# Patient Record
Sex: Female | Born: 1955 | Race: Black or African American | Hispanic: No | Marital: Married | Smoking: Former smoker
Health system: Southern US, Community
[De-identification: ages and names within clinical notes are randomized; demographics above are authoritative.]

## PROBLEM LIST (undated history)

## (undated) DIAGNOSIS — G9340 Encephalopathy, unspecified: Secondary | ICD-10-CM

## (undated) DIAGNOSIS — A419 Sepsis, unspecified organism: Secondary | ICD-10-CM

## (undated) DIAGNOSIS — J9600 Acute respiratory failure, unspecified whether with hypoxia or hypercapnia: Secondary | ICD-10-CM

## (undated) DIAGNOSIS — I10 Essential (primary) hypertension: Secondary | ICD-10-CM

## (undated) DIAGNOSIS — Z9889 Other specified postprocedural states: Secondary | ICD-10-CM

## (undated) DIAGNOSIS — R1310 Dysphagia, unspecified: Secondary | ICD-10-CM

## (undated) DIAGNOSIS — I639 Cerebral infarction, unspecified: Secondary | ICD-10-CM

---

## 2017-11-07 ENCOUNTER — Emergency Department (HOSPITAL_COMMUNITY): Payer: Medicaid Other

## 2017-11-07 ENCOUNTER — Inpatient Hospital Stay (HOSPITAL_COMMUNITY)
Admission: EM | Admit: 2017-11-07 | Discharge: 2017-11-30 | DRG: 004 | Disposition: A | Discharge status: Skilled Nursing Facility | Payer: Medicaid Other | Attending: Family Medicine | Admitting: Family Medicine

## 2017-11-07 ENCOUNTER — Encounter (HOSPITAL_COMMUNITY): Payer: Self-pay | Admitting: Nurse Practitioner

## 2017-11-07 DIAGNOSIS — F419 Anxiety disorder, unspecified: Secondary | ICD-10-CM | POA: Diagnosis present

## 2017-11-07 DIAGNOSIS — I110 Hypertensive heart disease with heart failure: Secondary | ICD-10-CM | POA: Diagnosis present

## 2017-11-07 DIAGNOSIS — R000 Tachycardia, unspecified: Secondary | ICD-10-CM | POA: Diagnosis present

## 2017-11-07 DIAGNOSIS — Z0189 Encounter for other specified special examinations: Secondary | ICD-10-CM

## 2017-11-07 DIAGNOSIS — E1165 Type 2 diabetes mellitus with hyperglycemia: Secondary | ICD-10-CM | POA: Diagnosis present

## 2017-11-07 DIAGNOSIS — E872 Acidosis: Secondary | ICD-10-CM | POA: Diagnosis present

## 2017-11-07 DIAGNOSIS — R0902 Hypoxemia: Secondary | ICD-10-CM | POA: Diagnosis not present

## 2017-11-07 DIAGNOSIS — Z978 Presence of other specified devices: Secondary | ICD-10-CM

## 2017-11-07 DIAGNOSIS — E785 Hyperlipidemia, unspecified: Secondary | ICD-10-CM | POA: Diagnosis present

## 2017-11-07 DIAGNOSIS — F0390 Unspecified dementia without behavioral disturbance: Secondary | ICD-10-CM | POA: Diagnosis present

## 2017-11-07 DIAGNOSIS — R509 Fever, unspecified: Secondary | ICD-10-CM | POA: Diagnosis not present

## 2017-11-07 DIAGNOSIS — Z79899 Other long term (current) drug therapy: Secondary | ICD-10-CM

## 2017-11-07 DIAGNOSIS — I361 Nonrheumatic tricuspid (valve) insufficiency: Secondary | ICD-10-CM | POA: Diagnosis not present

## 2017-11-07 DIAGNOSIS — I2510 Atherosclerotic heart disease of native coronary artery without angina pectoris: Secondary | ICD-10-CM | POA: Diagnosis present

## 2017-11-07 DIAGNOSIS — J9602 Acute respiratory failure with hypercapnia: Secondary | ICD-10-CM | POA: Diagnosis present

## 2017-11-07 DIAGNOSIS — A419 Sepsis, unspecified organism: Secondary | ICD-10-CM | POA: Diagnosis not present

## 2017-11-07 DIAGNOSIS — R109 Unspecified abdominal pain: Secondary | ICD-10-CM

## 2017-11-07 DIAGNOSIS — R1110 Vomiting, unspecified: Secondary | ICD-10-CM

## 2017-11-07 DIAGNOSIS — I5032 Chronic diastolic (congestive) heart failure: Secondary | ICD-10-CM | POA: Diagnosis present

## 2017-11-07 DIAGNOSIS — J9601 Acute respiratory failure with hypoxia: Secondary | ICD-10-CM | POA: Diagnosis present

## 2017-11-07 DIAGNOSIS — K567 Ileus, unspecified: Secondary | ICD-10-CM | POA: Diagnosis not present

## 2017-11-07 DIAGNOSIS — N390 Urinary tract infection, site not specified: Secondary | ICD-10-CM | POA: Diagnosis present

## 2017-11-07 DIAGNOSIS — Z993 Dependence on wheelchair: Secondary | ICD-10-CM

## 2017-11-07 DIAGNOSIS — A4151 Sepsis due to Escherichia coli [E. coli]: Secondary | ICD-10-CM | POA: Diagnosis present

## 2017-11-07 DIAGNOSIS — B370 Candidal stomatitis: Secondary | ICD-10-CM | POA: Diagnosis not present

## 2017-11-07 DIAGNOSIS — J9811 Atelectasis: Secondary | ICD-10-CM

## 2017-11-07 DIAGNOSIS — R0689 Other abnormalities of breathing: Secondary | ICD-10-CM | POA: Diagnosis not present

## 2017-11-07 DIAGNOSIS — Z4659 Encounter for fitting and adjustment of other gastrointestinal appliance and device: Secondary | ICD-10-CM

## 2017-11-07 DIAGNOSIS — E874 Mixed disorder of acid-base balance: Secondary | ICD-10-CM | POA: Diagnosis not present

## 2017-11-07 DIAGNOSIS — R1310 Dysphagia, unspecified: Secondary | ICD-10-CM

## 2017-11-07 DIAGNOSIS — G9340 Encephalopathy, unspecified: Secondary | ICD-10-CM | POA: Diagnosis not present

## 2017-11-07 DIAGNOSIS — R1084 Generalized abdominal pain: Secondary | ICD-10-CM | POA: Diagnosis present

## 2017-11-07 DIAGNOSIS — I161 Hypertensive emergency: Secondary | ICD-10-CM

## 2017-11-07 DIAGNOSIS — R112 Nausea with vomiting, unspecified: Secondary | ICD-10-CM | POA: Diagnosis not present

## 2017-11-07 DIAGNOSIS — Z794 Long term (current) use of insulin: Secondary | ICD-10-CM

## 2017-11-07 DIAGNOSIS — I248 Other forms of acute ischemic heart disease: Secondary | ICD-10-CM | POA: Diagnosis present

## 2017-11-07 DIAGNOSIS — G9341 Metabolic encephalopathy: Secondary | ICD-10-CM | POA: Diagnosis not present

## 2017-11-07 DIAGNOSIS — R4701 Aphasia: Secondary | ICD-10-CM | POA: Diagnosis present

## 2017-11-07 DIAGNOSIS — Z7902 Long term (current) use of antithrombotics/antiplatelets: Secondary | ICD-10-CM

## 2017-11-07 DIAGNOSIS — G8929 Other chronic pain: Secondary | ICD-10-CM | POA: Diagnosis present

## 2017-11-07 DIAGNOSIS — Z7983 Long term (current) use of bisphosphonates: Secondary | ICD-10-CM

## 2017-11-07 DIAGNOSIS — Z930 Tracheostomy status: Secondary | ICD-10-CM

## 2017-11-07 DIAGNOSIS — Z6834 Body mass index (BMI) 34.0-34.9, adult: Secondary | ICD-10-CM

## 2017-11-07 DIAGNOSIS — Z01818 Encounter for other preprocedural examination: Secondary | ICD-10-CM

## 2017-11-07 DIAGNOSIS — G8194 Hemiplegia, unspecified affecting left nondominant side: Secondary | ICD-10-CM | POA: Diagnosis not present

## 2017-11-07 DIAGNOSIS — B373 Candidiasis of vulva and vagina: Secondary | ICD-10-CM | POA: Diagnosis not present

## 2017-11-07 DIAGNOSIS — D638 Anemia in other chronic diseases classified elsewhere: Secondary | ICD-10-CM | POA: Diagnosis present

## 2017-11-07 DIAGNOSIS — Z9289 Personal history of other medical treatment: Secondary | ICD-10-CM

## 2017-11-07 DIAGNOSIS — Z7189 Other specified counseling: Secondary | ICD-10-CM | POA: Diagnosis not present

## 2017-11-07 DIAGNOSIS — E669 Obesity, unspecified: Secondary | ICD-10-CM | POA: Diagnosis present

## 2017-11-07 DIAGNOSIS — I69354 Hemiplegia and hemiparesis following cerebral infarction affecting left non-dominant side: Secondary | ICD-10-CM

## 2017-11-07 HISTORY — DX: Essential (primary) hypertension: I10

## 2017-11-07 HISTORY — DX: Cerebral infarction, unspecified: I63.9

## 2017-11-07 LAB — I-STAT TROPONIN, ED: TROPONIN I, POC: 0.23 ng/mL — AB (ref 0.00–0.08)

## 2017-11-07 LAB — COMPREHENSIVE METABOLIC PANEL
ALBUMIN: 3.9 g/dL (ref 3.5–5.0)
ALK PHOS: 112 U/L (ref 38–126)
ALT: 16 U/L (ref 14–54)
AST: 23 U/L (ref 15–41)
Anion gap: 13 (ref 5–15)
BUN: 16 mg/dL (ref 6–20)
CALCIUM: 10.4 mg/dL — AB (ref 8.9–10.3)
CO2: 29 mmol/L (ref 22–32)
Chloride: 99 mmol/L — ABNORMAL LOW (ref 101–111)
Creatinine, Ser: 1.09 mg/dL — ABNORMAL HIGH (ref 0.44–1.00)
GFR calc Af Amer: >60 mL/min (ref >60)
GFR, EST NON AFRICAN AMERICAN: 53 mL/min — AB (ref >60)
GLUCOSE: 187 mg/dL — AB (ref 65–99)
Potassium: 4.4 mmol/L (ref 3.5–5.1)
Sodium: 141 mmol/L (ref 135–145)
TOTAL PROTEIN: 8.6 g/dL — AB (ref 6.5–8.1)
Total Bilirubin: 0.4 mg/dL (ref 0.3–1.2)

## 2017-11-07 LAB — I-STAT CG4 LACTIC ACID, ED
LACTIC ACID, VENOUS: 2.29 mmol/L — AB (ref 0.5–1.9)
Lactic Acid, Venous: 2.35 mmol/L (ref 0.5–1.9)

## 2017-11-07 LAB — CBC WITH DIFFERENTIAL/PLATELET
BASOS PCT: 0 %
Basophils Absolute: 0 10*3/uL (ref 0.0–0.1)
EOS ABS: 0 10*3/uL (ref 0.0–0.7)
EOS PCT: 0 %
HCT: 42.1 % (ref 36.0–46.0)
Hemoglobin: 13.2 g/dL (ref 12.0–15.0)
Lymphocytes Relative: 12 %
Lymphs Abs: 1.3 10*3/uL (ref 0.7–4.0)
MCH: 27.7 pg (ref 26.0–34.0)
MCHC: 31.4 g/dL (ref 30.0–36.0)
MCV: 88.3 fL (ref 78.0–100.0)
MONO ABS: 1.1 10*3/uL — AB (ref 0.1–1.0)
Monocytes Relative: 9 %
Neutro Abs: 9.3 10*3/uL — ABNORMAL HIGH (ref 1.7–7.7)
Neutrophils Relative %: 79 %
PLATELETS: 433 10*3/uL — AB (ref 150–400)
RBC: 4.77 MIL/uL (ref 3.87–5.11)
RDW: 15.3 % (ref 11.5–15.5)
WBC: 11.7 10*3/uL — ABNORMAL HIGH (ref 4.0–10.5)

## 2017-11-07 MED ORDER — PIPERACILLIN-TAZOBACTAM 3.375 G IVPB
3.3750 g | Freq: Three times a day (TID) | INTRAVENOUS | Status: DC
  Administered 2017-11-08 – 2017-11-10 (×7): 3.375 g via INTRAVENOUS
  Filled 2017-11-07 (×7): qty 50

## 2017-11-07 MED ORDER — IOPAMIDOL (ISOVUE-370) INJECTION 76%: 100.0000 mL | Freq: Once | INTRAVENOUS | Status: DC | PRN

## 2017-11-07 MED ORDER — PIPERACILLIN-TAZOBACTAM IN DEX 2-0.25 GM/50ML IV SOLN: 2.2500 g | Freq: Four times a day (QID) | INTRAVENOUS | Status: DC

## 2017-11-07 MED ORDER — SODIUM CHLORIDE 0.9 % IV BOLUS (SEPSIS)
1000.0000 mL | Freq: Once | INTRAVENOUS | Status: AC
Start: 2017-11-07 — End: 2017-11-08
  Administered 2017-11-07: 1000 mL via INTRAVENOUS

## 2017-11-07 MED ORDER — ACETAMINOPHEN 650 MG RE SUPP
650.0000 mg | Freq: Four times a day (QID) | RECTAL | Status: DC | PRN
  Administered 2017-11-09 – 2017-11-12 (×2): 650 mg via RECTAL
  Filled 2017-11-07 (×3): qty 1

## 2017-11-07 MED ORDER — ONDANSETRON HCL 4 MG PO TABS: 4.0000 mg | ORAL_TABLET | Freq: Four times a day (QID) | ORAL | Status: DC | PRN

## 2017-11-07 MED ORDER — SODIUM CHLORIDE 0.9% FLUSH
3.0000 mL | Freq: Two times a day (BID) | INTRAVENOUS | Status: DC
  Administered 2017-11-08 – 2017-11-30 (×37): 3 mL via INTRAVENOUS

## 2017-11-07 MED ORDER — PIPERACILLIN-TAZOBACTAM 3.375 G IVPB 30 MIN
3.3750 g | INTRAVENOUS | Status: AC
  Administered 2017-11-07: 3.375 g via INTRAVENOUS
  Filled 2017-11-07: qty 50

## 2017-11-07 MED ORDER — SODIUM CHLORIDE 0.9 % IV SOLN
INTRAVENOUS | Status: DC
  Administered 2017-11-08 – 2017-11-09 (×3): via INTRAVENOUS

## 2017-11-07 MED ORDER — INSULIN ASPART 100 UNIT/ML ~~LOC~~ SOLN
0.0000 [IU] | SUBCUTANEOUS | Status: DC
  Administered 2017-11-08 (×2): 3 [IU] via SUBCUTANEOUS
  Administered 2017-11-08: 2 [IU] via SUBCUTANEOUS
  Administered 2017-11-08 (×2): 3 [IU] via SUBCUTANEOUS
  Administered 2017-11-09 – 2017-11-11 (×4): 2 [IU] via SUBCUTANEOUS
  Administered 2017-11-11: 3 [IU] via SUBCUTANEOUS
  Administered 2017-11-11 – 2017-11-12 (×5): 2 [IU] via SUBCUTANEOUS
  Administered 2017-11-13 (×6): 3 [IU] via SUBCUTANEOUS
  Administered 2017-11-14 (×2): 2 [IU] via SUBCUTANEOUS
  Administered 2017-11-14 (×3): 3 [IU] via SUBCUTANEOUS
  Administered 2017-11-14: 2 [IU] via SUBCUTANEOUS
  Administered 2017-11-14: 5 [IU] via SUBCUTANEOUS
  Administered 2017-11-15: 3 [IU] via SUBCUTANEOUS
  Administered 2017-11-15 (×2): 2 [IU] via SUBCUTANEOUS
  Administered 2017-11-15 (×2): 5 [IU] via SUBCUTANEOUS
  Administered 2017-11-16 (×3): 3 [IU] via SUBCUTANEOUS
  Administered 2017-11-16: 2 [IU] via SUBCUTANEOUS
  Administered 2017-11-16: 3 [IU] via SUBCUTANEOUS
  Administered 2017-11-16 – 2017-11-19 (×7): 2 [IU] via SUBCUTANEOUS
  Administered 2017-11-20 (×2): 3 [IU] via SUBCUTANEOUS
  Administered 2017-11-20 (×2): 2 [IU] via SUBCUTANEOUS
  Administered 2017-11-20 (×2): 3 [IU] via SUBCUTANEOUS
  Administered 2017-11-21 (×2): 5 [IU] via SUBCUTANEOUS
  Administered 2017-11-21 – 2017-11-22 (×5): 3 [IU] via SUBCUTANEOUS
  Administered 2017-11-22 (×2): 5 [IU] via SUBCUTANEOUS
  Administered 2017-11-22 (×2): 3 [IU] via SUBCUTANEOUS
  Administered 2017-11-22 – 2017-11-23 (×2): 5 [IU] via SUBCUTANEOUS
  Administered 2017-11-23: 3 [IU] via SUBCUTANEOUS
  Administered 2017-11-23: 5 [IU] via SUBCUTANEOUS
  Administered 2017-11-23: 8 [IU] via SUBCUTANEOUS
  Administered 2017-11-23: 5 [IU] via SUBCUTANEOUS
  Administered 2017-11-23: 3 [IU] via SUBCUTANEOUS
  Administered 2017-11-24: 8 [IU] via SUBCUTANEOUS
  Administered 2017-11-24 (×2): 5 [IU] via SUBCUTANEOUS
  Administered 2017-11-25 (×2): 3 [IU] via SUBCUTANEOUS
  Administered 2017-11-25 – 2017-11-27 (×4): 2 [IU] via SUBCUTANEOUS
  Administered 2017-11-27: 3 [IU] via SUBCUTANEOUS
  Administered 2017-11-28: 2 [IU] via SUBCUTANEOUS
  Administered 2017-11-28 – 2017-11-29 (×2): 5 [IU] via SUBCUTANEOUS
  Administered 2017-11-29: 2 [IU] via SUBCUTANEOUS
  Administered 2017-11-29: 5 [IU] via SUBCUTANEOUS
  Administered 2017-11-30: 3 [IU] via SUBCUTANEOUS
  Filled 2017-11-07: qty 1

## 2017-11-07 MED ORDER — ONDANSETRON HCL 4 MG/2ML IJ SOLN
4.0000 mg | Freq: Once | INTRAMUSCULAR | Status: AC
  Administered 2017-11-07: 4 mg via INTRAVENOUS
  Filled 2017-11-07: qty 2

## 2017-11-07 MED ORDER — FENTANYL CITRATE (PF) 100 MCG/2ML IJ SOLN
50.0000 ug | Freq: Once | INTRAMUSCULAR | Status: AC
  Administered 2017-11-07: 50 ug via INTRAVENOUS
  Filled 2017-11-07: qty 2

## 2017-11-07 MED ORDER — VANCOMYCIN HCL IN DEXTROSE 1-5 GM/200ML-% IV SOLN
1000.0000 mg | Freq: Once | INTRAVENOUS | Status: AC
  Administered 2017-11-07: 1000 mg via INTRAVENOUS
  Filled 2017-11-07: qty 200

## 2017-11-07 MED ORDER — ONDANSETRON HCL 4 MG/2ML IJ SOLN
4.0000 mg | Freq: Four times a day (QID) | INTRAMUSCULAR | Status: DC | PRN
  Administered 2017-11-08: 4 mg via INTRAVENOUS
  Filled 2017-11-07: qty 2

## 2017-11-07 MED ORDER — ENOXAPARIN SODIUM 40 MG/0.4ML ~~LOC~~ SOLN
40.0000 mg | SUBCUTANEOUS | Status: DC
  Administered 2017-11-08 – 2017-11-30 (×23): 40 mg via SUBCUTANEOUS
  Filled 2017-11-07 (×24): qty 0.4

## 2017-11-07 MED ORDER — ACETAMINOPHEN 325 MG PO TABS
650.0000 mg | ORAL_TABLET | Freq: Four times a day (QID) | ORAL | Status: DC | PRN
  Administered 2017-11-08 – 2017-11-17 (×9): 650 mg via ORAL
  Filled 2017-11-07 (×9): qty 2

## 2017-11-07 MED ORDER — HYDRALAZINE HCL 20 MG/ML IJ SOLN
10.0000 mg | Freq: Three times a day (TID) | INTRAMUSCULAR | Status: DC | PRN
  Administered 2017-11-08 – 2017-11-10 (×7): 10 mg via INTRAVENOUS
  Filled 2017-11-07 (×8): qty 1

## 2017-11-07 MED ORDER — ACETAMINOPHEN 650 MG RE SUPP
650.0000 mg | Freq: Once | RECTAL | Status: AC
  Administered 2017-11-08: 650 mg via RECTAL
  Filled 2017-11-07: qty 1

## 2017-11-07 NOTE — ED Provider Notes (Signed)
[facility]-EMERGENCY DEPT Provider Note   CSN: [id number] Arrival date & time: 11/07/17  1734     History   Chief Complaint Chief Complaint  Patient presents with  . Abdominal Pain  . Fever    HPI Grace LefevreGwendolyn [name] is a 62 y.o. 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  [facility] for evaluation of diffuse abdominal pain that began at 3 PM last night.  Patient states she has not been able to eat or drink anything today.  No vomiting that she notes.  Patient reports she had a bowel movement yesterday and states was loose.  EM LEVEL 5 CAVEAT: DUE TO PATIENT'S BASELINE VERBAL DIFFICULTIES   The history is provided by the EMS personnel, the nursing home and the patient. The history is limited by the condition of the patient.    Past Medical History:  Diagnosis Date  . Hypertension   . Stroke [facility](HCC)     Patient Active Problem List   Diagnosis Date Noted  . Sepsis (HCC) 11/07/2017    History reviewed. No pertinent surgical history.   OB History   None      Home Medications    Prior to Admission medications   Medication Sig Start Date End Date Taking? Authorizing Provider  acetaminophen (TYLENOL) 325 MG tablet Take 650 mg by mouth 3 (three) times daily. May also take 650 mg q6h prn   Yes [provider]  alendronate (FOSAMAX) 70 MG tablet Take 70 mg by mouth every Friday. Take with a full glass of water on an empty stomach.   Yes [provider]  atorvastatin (LIPITOR) 20 MG tablet Take 30 mg by mouth at bedtime.   Yes [provider]  calcium-vitamin D (OSCAL WITH D) 500-200 MG-UNIT tablet Take 1 tablet by mouth 2 (two) times daily.   Yes [provider]  cloNIDine (CATAPRES) 0.3 MG tablet Take 0.3 mg by mouth 3 (three) times daily.   Yes [provider]  clopidogrel (PLAVIX) 75 MG tablet Take 75 mg by mouth daily.   Yes [provider]  diltiazem (DILACOR XR) 240 MG 24 hr capsule Take 240 mg by mouth daily.   Yes [provider]  doxazosin (CARDURA) 2 MG tablet Take 3 mg by mouth daily.   Yes [provider]  DULoxetine (CYMBALTA) 60 MG capsule Take 60 mg by mouth daily.   Yes [provider]  ferrous sulfate 325 (65 FE) MG EC tablet Take 325 mg by mouth at bedtime.   Yes [provider]  furosemide (LASIX) 20 MG tablet Take 20 mg by mouth daily.   Yes [provider]  gabapentin (NEURONTIN) 300 MG capsule Take 300-600 mg by mouth 2 (two) times daily. 300 mg every morning, 600 mg every night   Yes [provider]  guaiFENesin (ROBITUSSIN) 100 MG/5ML SOLN Take 15 mLs by mouth every 4 (four) hours as needed for cough or to loosen phlegm.   Yes [provider]  hydrALAZINE (APRESOLINE) 100 MG tablet Take 100 mg by mouth 2 (two) times daily.   Yes [provider]  insulin glargine (LANTUS) 100 UNIT/ML injection Inject 20 Units into the skin at bedtime.   Yes [provider]  insulin lispro (HUMALOG) 100 UNIT/ML injection Inject 2-8 Units into the skin 2 (two) times daily. 201-250= 2 units 251-300= 4 units  301-350- 6 units 350-400= 8 units > 400 Call MD   Yes [provider]  losartan (COZAAR) 100 MG tablet Take 100 mg by mouth daily.   Yes [provider]  metFORMIN (GLUCOPHAGE) 500 MG tablet Take 500 mg by mouth 2 (two) times daily with a meal.   Yes [provider]  metoprolol tartrate (LOPRESSOR) 100 MG tablet Take 200 mg by mouth 2 (two) times daily.   Yes [provider]  montelukast (SINGULAIR) 10 MG tablet Take 10 mg by mouth at bedtime.   Yes [provider]  nitroGLYCERIN (NITROSTAT) 0.4 MG SL tablet Place 0.4 mg under the tongue every 5 (five) minutes as needed for chest pain.   Yes [provider]  polyethylene glycol (MIRALAX / GLYCOLAX) packet Take 17 g by mouth daily as needed for mild constipation or moderate constipation.   Yes [provider]  spironolactone (ALDACTONE) 25 MG tablet Take 25 mg by mouth daily.   Yes [provider]  Vitamin D, Ergocalciferol, (DRISDOL) 50000 units CAPS capsule Take 50,000 Units by mouth every 30 (thirty) days.   Yes [provider]    Family History History reviewed. No pertinent family history.  Social History Social History   Tobacco Use  . Smoking status: Unknown If Ever Smoked  Substance Use Topics  . Alcohol use: Not Currently  . Drug use: Not Currently     Allergies   Patient has no known allergies.   Review of Systems Review of Systems  Unable to perform ROS: Acuity of condition  Gastrointestinal: Negative for nausea.     Physical Exam Updated Vital Signs BP (!) 181/105   Pulse (!) 136   Temp (!) 100.4 F (38 C) (Rectal)   Resp 19   Wt 127 kg (280 lb)   SpO2 97%   Physical Exam  Constitutional: She appears well-developed and well-nourished.  Appears uncomfortable   HENT:  Head: Normocephalic and atraumatic.  Mouth/Throat: Oropharynx is clear and moist and mucous membranes are normal.  Eyes: Pupils are equal, round, and reactive to light. Conjunctivae, EOM and lids are normal.  Neck: Full passive range of motion without pain.  Cardiovascular: Regular rhythm, normal heart sounds and normal pulses. Tachycardia present. Exam reveals no gallop and no friction rub.  No murmur heard. Pulmonary/Chest: Effort normal and breath sounds normal.  No evidence of respiratory distress. Able to speak in full sentences without difficulty.  Abdominal: Soft. Normal appearance. There is generalized tenderness. There is no rigidity, no guarding and no CVA tenderness.  Abdomen is soft, non-distended. Diffuse tenderness to palpation to the generalized abdomen.  No rigidity, guarding.  No  peritoneal signs.  Musculoskeletal: Normal range of motion.  Neurological: She is alert.  Follows commands Moving all extremities spontaneously. Left sided hemiplegia (which review of records show is normal)  Limited neuro exam secondary to previous deficits.   Skin: Skin is warm and dry. Capillary refill takes less than 2 seconds.  Psychiatric: She has a normal mood and affect. Her speech is normal.  Nursing note and vitals reviewed.    ED Treatments / Results  Labs (all labs ordered are listed, but only abnormal results are displayed) Labs Reviewed  COMPREHENSIVE METABOLIC PANEL - Abnormal; Notable for the following components:      Result Value   Chloride 99 (*)    Glucose, Bld 187 (*)    Creatinine, Ser 1.09 (*)    Calcium 10.4 (*)    Total Protein  8.6 (*)    GFR calc non Af Amer 53 (*)    All other components within normal limits  CBC WITH DIFFERENTIAL/PLATELET - Abnormal; Notable for the following components:   WBC 11.7 (*)    Platelets 433 (*)    Neutro Abs 9.3 (*)    Monocytes Absolute 1.1 (*)    All other components within normal limits  I-STAT CG4 LACTIC ACID, ED - Abnormal; Notable for the following components:   Lactic Acid, Venous 2.35 (*)    All other components within normal limits  I-STAT TROPONIN, ED - Abnormal; Notable for the following components:   Troponin i, poc 0.23 (*)    All other components within normal limits  I-STAT CG4 LACTIC ACID, ED - Abnormal; Notable for the following components:   Lactic Acid, Venous 2.29 (*)    All other components within normal limits  CBG MONITORING, ED - Abnormal; Notable for the following components:   Glucose-Capillary 172 (*)    All other components within normal limits  CULTURE, BLOOD (ROUTINE X 2)  CULTURE, BLOOD (ROUTINE X 2)  URINALYSIS, ROUTINE W REFLEX MICROSCOPIC  LACTIC ACID, PLASMA  LACTIC ACID, PLASMA  HIV ANTIBODY (ROUTINE TESTING)  CBC  CREATININE, SERUM  BASIC METABOLIC PANEL  CBC     EKG EKG Interpretation  Date/Time:  Sunday November 07 2017 18:51:36 EDT Ventricular Rate:  116 PR Interval:    QRS Duration: 75 QT Interval:  301 QTC Calculation: 419 R Axis:   -14 Text Interpretation:  Sinus tachycardia Biatrial enlargement Abnormal R-wave progression, early transition Borderline repolarization abnormality Confirmed by Lorre Nick ([id number]) on 11/07/2017 7:04:09 PM   Radiology Ct Abdomen Pelvis Wo Contrast  Result Date: 11/07/2017 CLINICAL DATA:  Abdominal pain EXAM: CT ABDOMEN AND PELVIS WITHOUT CONTRAST TECHNIQUE: Multidetector CT imaging of the abdomen and pelvis was performed following the standard protocol without IV contrast. The examination was ordered as a CT angiography study, but there was infiltration during the contrast injection. COMPARISON:  None. FINDINGS: Lower chest: Coronary artery calcifications.  No pleural effusion. Hepatobiliary: Normal hepatic contours and density. No intra- or extrahepatic biliary dilatation. Cholelithiasis without acute inflammation. Pancreas: Normal parenchymal contours without ductal dilatation. No peripancreatic fluid collection. Spleen: Small spleen. Adrenals/Urinary Tract: --Adrenal glands: Normal. --Right kidney/ureter: Suspected excretion of contrast. Otherwise normal kidneys. --Left kidney/ureter: Suspected excretion of contrast, otherwise normal kidneys. --Urinary bladder: Small amount of excreted contrast in the urinary bladder. Stomach/Bowel: --Stomach/Duodenum: No hiatal hernia or other gastric abnormality. Normal duodenal course. --Small bowel: No dilatation or inflammation. --Colon: No focal abnormality. --Appendix: Normal. Vascular/Lymphatic: Atherosclerotic calcification is present within the non-aneurysmal abdominal aorta, without hemodynamically significant stenosis. No abdominal or pelvic lymphadenopathy. Reproductive: Status post hysterectomy. No adnexal mass. Musculoskeletal. No bony spinal canal stenosis or focal  osseous abnormality. Other: None. IMPRESSION: 1. No acute abnormality of the abdomen or pelvis. 2. Aortic Atherosclerosis (ICD10-I70.0). Coronary artery atherosclerosis. Electronically Signed   By: Deatra Robinson M.D.   On: 11/07/2017 22:41   Dg Chest 2 View  Result Date: 11/07/2017 CLINICAL DATA:  Abdominal pain and shortness of Breath EXAM: CHEST - 2 VIEW COMPARISON:  None. FINDINGS: Cardiac shadow is mildly enlarged. The lungs are well aerated bilaterally. No sizable effusion is seen. Downward displacement of the left humeral head is noted. This is likely related to some chronic subluxation. Clinical correlation is recommended. IMPRESSION: No acute abnormality in the chest. Downward displacement of the left humeral head likely related to subluxation. Correlate with the physical exam.  Electronically Signed   By: Alcide Clever M.D.   On: 11/07/2017 19:08   Dg Abd 2 Views  Result Date: 11/07/2017 CLINICAL DATA:  Diffuse abdominal pain EXAM: ABDOMEN - 2 VIEW COMPARISON:  None. FINDINGS: Scattered large and small bowel gas is noted. Mild retained fecal material is seen. No obstructive changes are noted. No free air is seen. No acute bony abnormality is noted. IMPRESSION: No acute abnormality seen. Electronically Signed   By: Alcide Clever M.D.   On: 11/07/2017 19:10    Procedures Procedures (including critical care time)  Medications Ordered in ED Medications  iopamidol (ISOVUE-370) 76 % injection 100 mL (has no administration in time range)  piperacillin-tazobactam (ZOSYN) IVPB 3.375 g (has no administration in time range)  hydrALAZINE (APRESOLINE) injection 10 mg (10 mg Intravenous Given 11/08/17 0115)  enoxaparin (LOVENOX) injection 40 mg (has no administration in time range)  sodium chloride flush (NS) 0.9 % injection 3 mL (3 mLs Intravenous Given 11/08/17 0116)  0.9 %  sodium chloride infusion (has no administration in time range)  acetaminophen (TYLENOL) tablet 650 mg (has no administration in  time range)    Or  acetaminophen (TYLENOL) suppository 650 mg (has no administration in time range)  ondansetron (ZOFRAN) tablet 4 mg (has no administration in time range)    Or  ondansetron (ZOFRAN) injection 4 mg (has no administration in time range)  insulin aspart (novoLOG) injection 0-15 Units (3 Units Subcutaneous Given 11/08/17 0133)  atorvastatin (LIPITOR) tablet 30 mg (has no administration in time range)  clopidogrel (PLAVIX) tablet 75 mg (has no administration in time range)  nitroGLYCERIN (NITROSTAT) SL tablet 0.4 mg (has no administration in time range)  fentaNYL (SUBLIMAZE) injection 50 mcg (50 mcg Intravenous Given 11/07/17 2055)  vancomycin (VANCOCIN) IVPB 1000 mg/200 mL premix (0 mg Intravenous Stopped 11/07/17 2345)  piperacillin-tazobactam (ZOSYN) IVPB 3.375 g (0 g Intravenous Stopped 11/07/17 2131)  ondansetron (ZOFRAN) injection 4 mg (4 mg Intravenous Given 11/07/17 2123)  sodium chloride 0.9 % bolus 1,000 mL (0 mLs Intravenous Stopped 11/08/17 0102)  fentaNYL (SUBLIMAZE) injection 50 mcg (50 mcg Intravenous Given 11/07/17 2332)  acetaminophen (TYLENOL) suppository 650 mg (650 mg Rectal Given 11/08/17 0005)  sodium chloride 0.9 % bolus 1,000 mL (1,000 mLs Intravenous New Bag/Given 11/08/17 0116)    Followed by  sodium chloride 0.9 % bolus 1,000 mL (1,000 mLs Intravenous New Bag/Given 11/08/17 0115)    Followed by  sodium chloride 0.9 % bolus 500 mL (500 mLs Intravenous New Bag/Given 11/08/17 0116)     Initial Impression / Assessment and Plan / ED Course  I have reviewed the triage vital signs and the nursing notes.  Pertinent labs & imaging results that were available during my care of the patient were reviewed by me and considered in my medical decision making (see chart for details).     62 year old female past medical history of hemiplegia, hemiparesis brought in by EMS from nursing home for diffuse abdominal pain that is been ongoing since last night.  Given Vicodin with  minimal improvement.  No p.o. since today.  No vomiting, fevers.  On initial ED arrival, patient was tachycardic, febrile.  On exam, patient has diffuse abdominal tenderness.  History is limited secondary to patient's difficulties with speaking secondary to CVA  residual effects.  Given patient's vital signs, code sepsis was initiated and patient was started on broad-spectrum antibiotics.  Plan to check basic labs, chest x-ray, CT abdomen pelvis.  Patient is currently on Lasix.  No recent history or echo noted in her chart.  Unclear of CHF status.  Will hold on full fluid resuscitation until chest x-ray is back to ensure no pulmonary edema.  Lactic acid elevated.  CBC shows leukocytosis of 11.7.  Hemoglobin and hematocrit are stable.  Platelets are stable.  CMP shows chloride of 99, hyperglycemia, creatinine slightly bumped.  Chest x-ray negative for any acute infectious etiology.   Patient was unable to get a CTA of the abdomen pelvis secondary to IV infiltration.  The CT tech had me personally evaluate the IV site.  There was no surrounding warmth, erythema,.  Compartments were soft.  Given that patient's lactic is only slightly elevated, less concern for mesenteric ischemia, will plan for CT abdomen pelvis without contrast.  CT abdomen pelvis negative for any acute infectious etiology.  Repeat lactic acid level still slightly elevated but slighlty improved.  Repeat temperature is 99.4.  Patient is septic but with unclear source at this time. UA is pending.  Will need admission for ongoing care.   Discussed with hospitalist.  Will plan to admit.  Discussed patient with Dr. Melynda Ripple (hospitalist). Will admit.    Final Clinical Impressions(s) / ED Diagnoses   Final diagnoses:  Generalized abdominal pain    ED Discharge Orders    None       Maxwell Caul, PA-C 11/08/17 0148

## 2017-11-07 NOTE — ED Provider Notes (Signed)
Procedure note: Ultrasound Guided Peripheral IV Ultrasound guided peripheral 1.88 inch angiocath IV placement performed by me. Indications: Nursing unable to place IV. Details: The antecubital fossa and upper arm were evaluated with a multifrequency linear probe. Patent brachial veins were noted. 1 attempt was made to cannulate a vein under realtime US guidance with successful cannulation of the vein and catheter placement. There is return of non-pulsatile dark red blood. The patient tolerated the procedure well without complications. Images archived electronically.  CPT codes: [id number] and [id number]    Melene PlanFloyd, Minyon Billiter, DO 11/07/17 2317

## 2017-11-07 NOTE — ED Notes (Signed)
Unable to get all of blood work. Patient stuck multiple times for blood draw and to attempt to gain access. Midline placed by IV team but no blood return noted.

## 2017-11-07 NOTE — ED Triage Notes (Signed)
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

## 2017-11-07 NOTE — Progress Notes (Signed)
A consult was received from an ED provider for Vancomycin and Zosyn per pharmacy dosing.  The patient's profile has been reviewed for ht/wt/allergies/indication/available labs.    A one time order has already been placed by provider for Vancomycin 1g IV. Will order Zosyn 3.375g IV x 1 over 30 minutes. Further antibiotics/pharmacy consults should be ordered by admitting physician if indicated.                       Thank you, Jamse MeadGadhia, Viren Lebeau M 11/07/2017  6:37 PM

## 2017-11-07 NOTE — ED Notes (Signed)
Bed: [id number] Expected date:  Expected time:  Means of arrival:  Comments: 62 yo abd pain

## 2017-11-07 NOTE — H&P (Signed)
[facility] History and Physical  Grace LefevreGwendolyn Konczal ZHY:[id number] DOB: [birth year] DOA: 11/07/2017  Referring physician:  PCP: Grace [name], Grace BuffyGerardo [name], Grace [name]   Chief Complaint: "My stomach hurts."  HPI: Grace [name] is a 62 y.o. female  With hx of CAD for stroke presents to the emergency room with abdominal pain.  History gathered from patient and nurse facility.  Patient with sudden onset of abdominal pain during the daytime.  Pain not better with laxative.  Patient also given her daily pain medicine.  Patient sent over to the emergency room for evaluation due to no pain relief.  ED course: CT abdomen and pelvis negative for acute abnormalities.  Patient found to be septic and started on vancomycin and Zosyn.  Cultures taken.  Hospitalist consulted for admission.   Review of Systems:  As per HPI otherwise 10 point review of systems negative.    Past Medical History:  Diagnosis Date  . Hypertension   . Stroke [facility](HCC)    History reviewed. No pertinent surgical history. Social History:  reports that she drank alcohol. She reports that she has current or past drug history. Her tobacco history is not on file.  No Known Allergies  History reviewed. No pertinent family history.   Prior to Admission medications   Not on File   Physical Exam: Vitals:   11/07/17 1758 11/07/17 1814 11/07/17 1815 11/07/17 2221  BP:  (!) 197/102  (!) 201/90  Pulse: (!) 113 (!) 117  (!) 118  Resp:  (!) 22  (!) 21  Temp:  (!) 100.4 F (38 C)    TempSrc:  Rectal    SpO2:  96%  92%  Weight:   127 kg (280 lb)     Wt Readings from Last 3 Encounters:  11/07/17 127 kg (280 lb)    General:  Appears calm and comfortable; A&Ox1, GCS 15 Eyes:  PERRL, EOMI, normal lids, iris ENT:  grossly normal hearing, lips & tongue Neck:  no LAD, masses or thyromegaly Cardiovascular:  RRR, no m/r/g. No LE edema.  Respiratory:  CTA bilaterally, no w/r/r. Normal respiratory effort. Abdomen:  soft,  ntnd Skin:  no rash or induration seen on limited exam Musculoskeletal:  grossly normal tone BUE/BLE Psychiatric:  grossly normal mood and affect, speech fluent and appropriate Neurologic:  CN 2-12 grossly intact, moves all extremities in coordinated fashion.          Labs on Admission:  Basic Metabolic Panel: Recent Labs  Lab 11/07/17 2036  NA 141  K 4.4  CL 99*  CO2 29  GLUCOSE 187*  BUN 16  CREATININE 1.09*  CALCIUM 10.4*   Liver Function Tests: Recent Labs  Lab 11/07/17 2036  AST 23  ALT 16  ALKPHOS 112  BILITOT 0.4  PROT 8.6*  ALBUMIN 3.9   No results for input(s): LIPASE, AMYLASE in the last 168 hours. No results for input(s): AMMONIA in the last 168 hours. CBC: Recent Labs  Lab 11/07/17 2036  WBC 11.7*  NEUTROABS 9.3*  HGB 13.2  HCT 42.1  MCV 88.3  PLT 433*   Cardiac Enzymes: No results for input(s): CKTOTAL, CKMB, CKMBINDEX, TROPONINI in the last 168 hours.  BNP (last 3 results) No results for input(s): BNP in the last 8760 hours.  ProBNP (last 3 results) No results for input(s): PROBNP in the last 8760 hours.   Creatinine clearance cannot be calculated (Unknown ideal weight.)  CBG: No results for input(s): GLUCAP in the last 168 hours.  Radiological Exams on  Admission: Ct Abdomen Pelvis Wo Contrast  Result Date: 11/07/2017 CLINICAL DATA:  Abdominal pain EXAM: CT ABDOMEN AND PELVIS WITHOUT CONTRAST TECHNIQUE: Multidetector CT imaging of the abdomen and pelvis was performed following the standard protocol without IV contrast. The examination was ordered as a CT angiography study, but there was infiltration during the contrast injection. COMPARISON:  None. FINDINGS: Lower chest: Coronary artery calcifications.  No pleural effusion. Hepatobiliary: Normal hepatic contours and density. No intra- or extrahepatic biliary dilatation. Cholelithiasis without acute inflammation. Pancreas: Normal parenchymal contours without ductal dilatation. No  peripancreatic fluid collection. Spleen: Small spleen. Adrenals/Urinary Tract: --Adrenal glands: Normal. --Right kidney/ureter: Suspected excretion of contrast. Otherwise normal kidneys. --Left kidney/ureter: Suspected excretion of contrast, otherwise normal kidneys. --Urinary bladder: Small amount of excreted contrast in the urinary bladder. Stomach/Bowel: --Stomach/Duodenum: No hiatal hernia or other gastric abnormality. Normal duodenal course. --Small bowel: No dilatation or inflammation. --Colon: No focal abnormality. --Appendix: Normal. Vascular/Lymphatic: Atherosclerotic calcification is present within the non-aneurysmal abdominal aorta, without hemodynamically significant stenosis. No abdominal or pelvic lymphadenopathy. Reproductive: Status post hysterectomy. No adnexal mass. Musculoskeletal. No bony spinal canal stenosis or focal osseous abnormality. Other: None. IMPRESSION: 1. No acute abnormality of the abdomen or pelvis. 2. Aortic Atherosclerosis (ICD10-I70.0). Coronary artery atherosclerosis. Electronically Signed   By: Deatra Robinson M.D.   On: 11/07/2017 22:41   Dg Chest 2 View  Result Date: 11/07/2017 CLINICAL DATA:  Abdominal pain and shortness of Breath EXAM: CHEST - 2 VIEW COMPARISON:  None. FINDINGS: Cardiac shadow is mildly enlarged. The lungs are well aerated bilaterally. No sizable effusion is seen. Downward displacement of the left humeral head is noted. This is likely related to some chronic subluxation. Clinical correlation is recommended. IMPRESSION: No acute abnormality in the chest. Downward displacement of the left humeral head likely related to subluxation. Correlate with the physical exam. Electronically Signed   By: Alcide Clever M.D.   On: 11/07/2017 19:08   Dg Abd 2 Views  Result Date: 11/07/2017 CLINICAL DATA:  Diffuse abdominal pain EXAM: ABDOMEN - 2 VIEW COMPARISON:  None. FINDINGS: Scattered large and small bowel gas is noted. Mild retained fecal material is seen. No  obstructive changes are noted. No free air is seen. No acute bony abnormality is noted. IMPRESSION: No acute abnormality seen. Electronically Signed   By: Alcide Clever M.D.   On: 11/07/2017 19:10    EKG: Independently reviewed. Sinus tach, no stemi.  Assessment/Plan Active Problems:   Sepsis (HCC)   Sepsis 2/2 unk Patient hemodynamically stable Given vanc emergency room, will continue Given zosyn in the emergency room, will continue Urine culture pending Blood cultures 2 pending Patient given 1000 mL of fluid in the emergency room, will give another 2500 ml as bolus, then MIVF Lactic acid elevated Lipase normal  Decr loc Due to pain meds? Checking head CT  Hypoxia Likely due to pain meds CXR neg Will monitor Repeat CXR in AM  Hypertension When necessary hydralazine 10 mg IV as needed for severe blood pressure Hold clonidine, cardura, hydrlazine, cozaar, lopressor, aldactone, diltiazem, lasix  Chronic pain Hold gabapentin  Hx of Stroke Will monitor Con plavix Hold Cymbalta  CAD Prn ntg sl  DM SSI q4 Hold lantus, metformin  Code Status: FC  DVT Prophylaxis: lovenox Family Communication: none available, called spouses number in chart Disposition Plan: Pending Improvement  Status: sdu, inpt  Haydee Salter, [name] [email or web address] Password [email or web address]

## 2017-11-07 NOTE — ED Provider Notes (Signed)
Medical screening examination/treatment/procedure(s) were conducted as a shared visit with non-physician practitioner(s) and myself.  I personally evaluated the patient during the encounter.   EKG Interpretation  Date/Time:  Sunday November 07 2017 18:51:36 EDT Ventricular Rate:  116 PR Interval:    QRS Duration: 75 QT Interval:  301 QTC Calculation: 419 R Axis:   -14 Text Interpretation:  Sinus tachycardia Biatrial enlargement Abnormal R-wave progression, early transition Borderline repolarization abnormality Confirmed by Maddex Garlitz ([id number]) on 11/07/2017 7:04:09 PM     62  year old female presents from nursing home with acute onset of abdominal pain.  She has a history of dementia and history is limited.  On exam she is diffusely tender without peritoneal signs.  I suspicion for acute intra-abdominal process.  Labs and CT are pending.  Will reassess   Lorre NickAllen, Shaneisha Burkel, MD 11/07/17 [phone]

## 2017-11-07 NOTE — ED Notes (Signed)
Order put in for iv team consult. Main lab phlebotomy called for lab draw. Pt difficult stick. Unable to start a peripheral line at this time.

## 2017-11-08 ENCOUNTER — Inpatient Hospital Stay (HOSPITAL_COMMUNITY): Payer: Medicaid Other

## 2017-11-08 ENCOUNTER — Other Ambulatory Visit: Payer: Self-pay

## 2017-11-08 ENCOUNTER — Encounter (HOSPITAL_COMMUNITY): Payer: Self-pay

## 2017-11-08 DIAGNOSIS — R0902 Hypoxemia: Secondary | ICD-10-CM

## 2017-11-08 DIAGNOSIS — R1084 Generalized abdominal pain: Secondary | ICD-10-CM

## 2017-11-08 DIAGNOSIS — J9601 Acute respiratory failure with hypoxia: Secondary | ICD-10-CM

## 2017-11-08 DIAGNOSIS — A419 Sepsis, unspecified organism: Secondary | ICD-10-CM

## 2017-11-08 DIAGNOSIS — R0689 Other abnormalities of breathing: Secondary | ICD-10-CM

## 2017-11-08 DIAGNOSIS — I161 Hypertensive emergency: Secondary | ICD-10-CM

## 2017-11-08 DIAGNOSIS — I361 Nonrheumatic tricuspid (valve) insufficiency: Secondary | ICD-10-CM

## 2017-11-08 DIAGNOSIS — G8194 Hemiplegia, unspecified affecting left nondominant side: Secondary | ICD-10-CM

## 2017-11-08 LAB — URINALYSIS, ROUTINE W REFLEX MICROSCOPIC
BILIRUBIN URINE: NEGATIVE
Bilirubin Urine: NEGATIVE
Glucose, UA: NEGATIVE mg/dL
Glucose, UA: NEGATIVE mg/dL
Ketones, ur: 5 mg/dL — AB
Ketones, ur: NEGATIVE mg/dL
Leukocytes, UA: NEGATIVE
NITRITE: NEGATIVE
Nitrite: NEGATIVE
PH: 7 (ref 5.0–8.0)
PROTEIN: 100 mg/dL — AB
Protein, ur: NEGATIVE mg/dL
SPECIFIC GRAVITY, URINE: 1.032 — AB (ref 1.005–1.030)
SPECIFIC GRAVITY, URINE: 1.012 (ref 1.005–1.030)
pH: 5 (ref 5.0–8.0)

## 2017-11-08 LAB — CBC
HCT: 40.3 % (ref 36.0–46.0)
HEMOGLOBIN: 12.4 g/dL (ref 12.0–15.0)
MCH: 27.9 pg (ref 26.0–34.0)
MCHC: 30.8 g/dL (ref 30.0–36.0)
MCV: 90.8 fL (ref 78.0–100.0)
PLATELETS: 439 10*3/uL — AB (ref 150–400)
RBC: 4.44 MIL/uL (ref 3.87–5.11)
RDW: 15.6 % — ABNORMAL HIGH (ref 11.5–15.5)
WBC: 14.3 10*3/uL — AB (ref 4.0–10.5)

## 2017-11-08 LAB — BASIC METABOLIC PANEL
ANION GAP: 11 (ref 5–15)
BUN: 16 mg/dL (ref 6–20)
CHLORIDE: 106 mmol/L (ref 101–111)
CO2: 26 mmol/L (ref 22–32)
CREATININE: 0.95 mg/dL (ref 0.44–1.00)
Calcium: 9.1 mg/dL (ref 8.9–10.3)
GFR calc non Af Amer: >60 mL/min (ref >60)
Glucose, Bld: 174 mg/dL — ABNORMAL HIGH (ref 65–99)
Potassium: 4.3 mmol/L (ref 3.5–5.1)
SODIUM: 143 mmol/L (ref 135–145)

## 2017-11-08 LAB — BLOOD GAS, ARTERIAL
ACID-BASE EXCESS: 0.9 mmol/L (ref 0.0–2.0)
Acid-Base Excess: 2.1 mmol/L — ABNORMAL HIGH (ref 0.0–2.0)
BICARBONATE: 25.3 mmol/L (ref 20.0–28.0)
Bicarbonate: 29.2 mmol/L — ABNORMAL HIGH (ref 20.0–28.0)
DRAWN BY: 295031
Drawn by: 441261
FIO2: 100
MECHVT: 500 mL
O2 Content: 6 L/min
O2 SAT: 99.6 %
O2 Saturation: 98.9 %
PATIENT TEMPERATURE: 98.6
PCO2 ART: 41.7 mmHg (ref 32.0–48.0)
PEEP/CPAP: 5 cmH2O
PH ART: 7.4 (ref 7.350–7.450)
Patient temperature: 98.6
RATE: 20 resp/min
pCO2 arterial: 61.6 mmHg — ABNORMAL HIGH (ref 32.0–48.0)
pH, Arterial: 7.297 — ABNORMAL LOW (ref 7.350–7.450)
pO2, Arterial: 191 mmHg — ABNORMAL HIGH (ref 83.0–108.0)
pO2, Arterial: 418 mmHg — ABNORMAL HIGH (ref 83.0–108.0)

## 2017-11-08 LAB — RAPID URINE DRUG SCREEN, HOSP PERFORMED
AMPHETAMINES: NOT DETECTED
Barbiturates: NOT DETECTED
Benzodiazepines: NOT DETECTED
COCAINE: NOT DETECTED
OPIATES: NOT DETECTED
TETRAHYDROCANNABINOL: NOT DETECTED

## 2017-11-08 LAB — TROPONIN I
Troponin I: 0.35 ng/mL (ref <0.03)
Troponin I: 0.37 ng/mL (ref <0.03)
Troponin I: 0.25 ng/mL (ref <0.03)

## 2017-11-08 LAB — CBG MONITORING, ED: GLUCOSE-CAPILLARY: 172 mg/dL — AB (ref 65–99)

## 2017-11-08 LAB — GLUCOSE, CAPILLARY
GLUCOSE-CAPILLARY: 121 mg/dL — AB (ref 65–99)
GLUCOSE-CAPILLARY: 154 mg/dL — AB (ref 65–99)
Glucose-Capillary: 155 mg/dL — ABNORMAL HIGH (ref 65–99)
Glucose-Capillary: 115 mg/dL — ABNORMAL HIGH (ref 65–99)
Glucose-Capillary: 159 mg/dL — ABNORMAL HIGH (ref 65–99)
Glucose-Capillary: 118 mg/dL — ABNORMAL HIGH (ref 65–99)

## 2017-11-08 LAB — ECHOCARDIOGRAM COMPLETE
HEIGHTINCHES: 64 in
WEIGHTICAEL: 3202.84 [oz_av]

## 2017-11-08 LAB — MRSA PCR SCREENING: MRSA by PCR: POSITIVE — AB

## 2017-11-08 LAB — HIV ANTIBODY (ROUTINE TESTING W REFLEX): HIV Screen 4th Generation wRfx: NONREACTIVE

## 2017-11-08 LAB — LACTIC ACID, PLASMA
LACTIC ACID, VENOUS: 2.2 mmol/L — AB (ref 0.5–1.9)
Lactic Acid, Venous: 1.5 mmol/L (ref 0.5–1.9)

## 2017-11-08 LAB — HEMOGLOBIN A1C
Hgb A1c MFr Bld: 6.7 % — ABNORMAL HIGH (ref 4.8–5.6)
Mean Plasma Glucose: 145.59 mg/dL

## 2017-11-08 MED ORDER — CHLORHEXIDINE GLUCONATE 0.12% ORAL RINSE (MEDLINE KIT)
15.0000 mL | Freq: Two times a day (BID) | OROMUCOSAL | Status: DC
  Administered 2017-11-08 – 2017-11-28 (×39): 15 mL via OROMUCOSAL

## 2017-11-08 MED ORDER — LABETALOL HCL 5 MG/ML IV SOLN
INTRAVENOUS | Status: AC
  Administered 2017-11-08: 10 mg via INTRAVENOUS
  Filled 2017-11-08: qty 4

## 2017-11-08 MED ORDER — CHLORHEXIDINE GLUCONATE CLOTH 2 % EX PADS: 6.0000 | MEDICATED_PAD | Freq: Every day | CUTANEOUS | Status: DC

## 2017-11-08 MED ORDER — METOPROLOL TARTRATE 25 MG/10 ML ORAL SUSPENSION
25.0000 mg | Freq: Two times a day (BID) | ORAL | Status: DC
  Administered 2017-11-08 – 2017-11-09 (×2): 25 mg
  Filled 2017-11-08 (×2): qty 10

## 2017-11-08 MED ORDER — ETOMIDATE 2 MG/ML IV SOLN
0.3000 mg/kg | Freq: Once | INTRAVENOUS | Status: AC
  Administered 2017-11-08: 20 mg via INTRAVENOUS

## 2017-11-08 MED ORDER — FENTANYL CITRATE (PF) 100 MCG/2ML IJ SOLN
INTRAMUSCULAR | Status: AC
  Administered 2017-11-08: 100 ug
  Filled 2017-11-08: qty 2

## 2017-11-08 MED ORDER — SENNOSIDES-DOCUSATE SODIUM 8.6-50 MG PO TABS: 1.0000 | ORAL_TABLET | Freq: Two times a day (BID) | ORAL | Status: DC

## 2017-11-08 MED ORDER — SODIUM CHLORIDE 0.9 % IV BOLUS (SEPSIS)
1000.0000 mL | Freq: Once | INTRAVENOUS | Status: AC
  Administered 2017-11-08: 1000 mL via INTRAVENOUS

## 2017-11-08 MED ORDER — MIDAZOLAM HCL 2 MG/2ML IJ SOLN
INTRAMUSCULAR | Status: AC
  Administered 2017-11-08: 2 mg
  Filled 2017-11-08: qty 2

## 2017-11-08 MED ORDER — NALOXONE HCL 0.4 MG/ML IJ SOLN
INTRAMUSCULAR | Status: AC
  Administered 2017-11-08: 0.4 mg
  Filled 2017-11-08: qty 1

## 2017-11-08 MED ORDER — ROCURONIUM BROMIDE 50 MG/5ML IV SOLN
1.0000 mg/kg | Freq: Once | INTRAVENOUS | Status: AC
  Administered 2017-11-08: 50 mg via INTRAVENOUS

## 2017-11-08 MED ORDER — ORAL CARE MOUTH RINSE
15.0000 mL | Freq: Four times a day (QID) | OROMUCOSAL | Status: DC
  Administered 2017-11-09 – 2017-11-30 (×78): 15 mL via OROMUCOSAL

## 2017-11-08 MED ORDER — FENTANYL CITRATE (PF) 100 MCG/2ML IJ SOLN
100.0000 ug | INTRAMUSCULAR | Status: AC | PRN
  Administered 2017-11-08 – 2017-11-09 (×3): 100 ug via INTRAVENOUS
  Filled 2017-11-08 (×4): qty 2

## 2017-11-08 MED ORDER — FENTANYL CITRATE (PF) 100 MCG/2ML IJ SOLN
100.0000 ug | INTRAMUSCULAR | Status: DC | PRN
  Administered 2017-11-08 – 2017-11-09 (×4): 100 ug via INTRAVENOUS
  Administered 2017-11-09: 50 ug via INTRAVENOUS
  Administered 2017-11-09 – 2017-11-10 (×5): 100 ug via INTRAVENOUS
  Filled 2017-11-08 (×9): qty 2

## 2017-11-08 MED ORDER — NICARDIPINE HCL IN NACL 20-0.86 MG/200ML-% IV SOLN
3.0000 mg/h | INTRAVENOUS | Status: AC
  Administered 2017-11-08: 5 mg/h via INTRAVENOUS
  Filled 2017-11-08 (×2): qty 200

## 2017-11-08 MED ORDER — CLOPIDOGREL BISULFATE 75 MG PO TABS
75.0000 mg | ORAL_TABLET | Freq: Every day | ORAL | Status: DC
  Administered 2017-11-09 – 2017-11-25 (×17): 75 mg
  Filled 2017-11-08 (×17): qty 1

## 2017-11-08 MED ORDER — BISACODYL 10 MG RE SUPP
10.0000 mg | Freq: Every day | RECTAL | Status: DC
  Administered 2017-11-08 – 2017-11-28 (×17): 10 mg via RECTAL
  Filled 2017-11-08 (×20): qty 1

## 2017-11-08 MED ORDER — SODIUM CHLORIDE 0.9 % IV BOLUS (SEPSIS)
500.0000 mL | Freq: Once | INTRAVENOUS | Status: AC
  Administered 2017-11-08: 500 mL via INTRAVENOUS

## 2017-11-08 MED ORDER — LABETALOL HCL 5 MG/ML IV SOLN
10.0000 mg | Freq: Once | INTRAVENOUS | Status: AC
  Administered 2017-11-08 (×2): 10 mg via INTRAVENOUS
  Filled 2017-11-08: qty 4

## 2017-11-08 MED ORDER — HYDROCODONE-ACETAMINOPHEN 5-325 MG PO TABS
1.0000 | ORAL_TABLET | Freq: Four times a day (QID) | ORAL | Status: DC | PRN
  Filled 2017-11-08: qty 2

## 2017-11-08 MED ORDER — DOCUSATE SODIUM 50 MG/5ML PO LIQD: 100.0000 mg | Freq: Two times a day (BID) | ORAL | Status: DC | PRN

## 2017-11-08 MED ORDER — ATORVASTATIN CALCIUM 10 MG PO TABS
30.0000 mg | ORAL_TABLET | Freq: Every day | ORAL | Status: DC
  Administered 2017-11-08 – 2017-11-26 (×19): 30 mg
  Filled 2017-11-08 (×21): qty 3

## 2017-11-08 MED ORDER — PANTOPRAZOLE SODIUM 40 MG PO PACK
40.0000 mg | PACK | Freq: Every day | ORAL | Status: DC
Start: 2017-11-09 — End: 2017-11-27
  Administered 2017-11-09 – 2017-11-25 (×17): 40 mg
  Filled 2017-11-08 (×17): qty 20

## 2017-11-08 MED ORDER — ATORVASTATIN CALCIUM 10 MG PO TABS
30.0000 mg | ORAL_TABLET | Freq: Every day | ORAL | Status: DC
  Filled 2017-11-08: qty 1

## 2017-11-08 MED ORDER — LABETALOL HCL 5 MG/ML IV SOLN
0.5000 mg/min | INTRAVENOUS | Status: DC
  Administered 2017-11-08: 0.5 mg/min via INTRAVENOUS
  Administered 2017-11-09 (×2): 1 mg/min via INTRAVENOUS
  Administered 2017-11-11: 0.5 mg/min via INTRAVENOUS
  Filled 2017-11-08: qty 100
  Filled 2017-11-08: qty 20
  Filled 2017-11-08: qty 80
  Filled 2017-11-08: qty 20

## 2017-11-08 MED ORDER — METOPROLOL TARTRATE 5 MG/5ML IV SOLN
5.0000 mg | Freq: Four times a day (QID) | INTRAVENOUS | Status: DC
  Administered 2017-11-08 (×2): 5 mg via INTRAVENOUS
  Filled 2017-11-08 (×2): qty 5

## 2017-11-08 MED ORDER — DEXMEDETOMIDINE HCL IN NACL 200 MCG/50ML IV SOLN
0.0000 ug/kg/h | INTRAVENOUS | Status: DC
  Administered 2017-11-08: 0.4 ug/kg/h via INTRAVENOUS
  Administered 2017-11-08: 0.6 ug/kg/h via INTRAVENOUS
  Administered 2017-11-08: 0.8 ug/kg/h via INTRAVENOUS
  Administered 2017-11-09 (×3): 0.9 ug/kg/h via INTRAVENOUS
  Filled 2017-11-08 (×6): qty 50

## 2017-11-08 MED ORDER — CLOPIDOGREL BISULFATE 75 MG PO TABS: 75.0000 mg | ORAL_TABLET | Freq: Every day | ORAL | Status: DC

## 2017-11-08 MED ORDER — MUPIROCIN 2 % EX OINT
1.0000 "application " | TOPICAL_OINTMENT | Freq: Two times a day (BID) | CUTANEOUS | Status: AC
  Administered 2017-11-08 – 2017-11-12 (×10): 1 via NASAL
  Filled 2017-11-08 (×2): qty 22

## 2017-11-08 MED ORDER — VANCOMYCIN HCL IN DEXTROSE 1-5 GM/200ML-% IV SOLN
1000.0000 mg | INTRAVENOUS | Status: DC
  Administered 2017-11-08 – 2017-11-10 (×3): 1000 mg via INTRAVENOUS
  Filled 2017-11-08 (×3): qty 200

## 2017-11-08 MED ORDER — NITROGLYCERIN 0.4 MG SL SUBL: 0.4000 mg | SUBLINGUAL_TABLET | SUBLINGUAL | Status: DC | PRN

## 2017-11-08 MED ORDER — MORPHINE SULFATE (PF) 4 MG/ML IV SOLN
2.0000 mg | INTRAVENOUS | Status: DC | PRN
  Administered 2017-11-08 – 2017-11-16 (×2): 2 mg via INTRAVENOUS
  Filled 2017-11-08 (×2): qty 1

## 2017-11-08 NOTE — Procedures (Signed)
OGT Placement  OGT placed under direct laryngoscopy and verified by auscultation during the intubation.  Alyson ReedyWesam G. Yacoub, M.D. [facility]. Pager: [phone]. After hours pager: [phone].

## 2017-11-08 NOTE — Progress Notes (Signed)
  Echocardiogram 2D Echocardiogram has been performed.  Janalyn HarderWest, Ahna Konkle R 11/08/2017, 9:49 AM

## 2017-11-08 NOTE — Progress Notes (Signed)
Pharmacy Antibiotic Note  Grace [name] is a 62 y.o. female admitted on 11/07/2017 with sepsis.  Pharmacy has been consulted for vancomycin dosing.  Plan: Zosyn 3.375g IV q8h (4 hour infusion). (MD) Vancomycin 1 Gm IV q24h for est AUC =493 Goal AUC = 400-500 Daily Scr F/u cultures/levels  Weight: 280 lb (127 kg)  Temp (24hrs), Avg:100.4 F (38 C), Min:100.4 F (38 C), Max:100.4 F (38 C)  Recent Labs  Lab 11/07/17 1907 11/07/17 2036 11/07/17 2255  WBC  --  11.7*  --   CREATININE  --  1.09*  --   LATICACIDVEN 2.35*  --  2.29*    CrCl cannot be calculated (Unknown ideal weight.).    No Known Allergies  Antimicrobials this admission: 3/24 zosyn >>  3/24 vancomycin >>   Dose adjustments this admission:   Microbiology results:  BCx:   UCx:    Sputum:    MRSA PCR:   Thank you for allowing pharmacy to be a part of this patient's care.  Lorenza EvangelistGreen, Burns Timson R 11/08/2017 12:05 AM

## 2017-11-08 NOTE — Procedures (Signed)
Intubation Procedure Note Grace [name] [id number] July 26, 1956  Procedure: Intubation Indications: Respiratory insufficiency  Procedure Details Consent: Risks of procedure as well as the alternatives and risks of each were explained to the (patient/caregiver).  Consent for procedure obtained. Time Out: Verified patient identification, verified procedure, site/side was marked, verified correct patient position, special equipment/implants available, medications/allergies/relevent history reviewed, required imaging and test results available.  Performed  Maximum sterile technique was used including gloves, hand hygiene and mask.  MAC    Evaluation Hemodynamic Status: BP stable throughout; O2 sats: stable throughout Patient's Current Condition: stable Complications: No apparent complications Patient did tolerate procedure well. Chest X-ray ordered to verify placement.  CXR: pending.   Grace [name] 11/08/2017

## 2017-11-08 NOTE — Clinical Social Work Note (Signed)
Clinical Social Work Assessment  Patient Details  Name: Jacalyn LefevreGwendolyn Marinaccio MRN: [id number] Date of Birth: June 15, 1956  Date of referral:  11/08/17               Reason for consult:  Facility Placement                Permission sought to share information with:  Family Supports Permission granted to share information::  Yes, Verbal Permission Granted  Name::       Wanita ChamberlainBordeaux, Johnny  Agency::  [facility]   Relationship::   Spouse   Contact Information:    [phone]  Housing/Transportation Living arrangements for the past 2 months:  Skilled Nursing Facility Source of Information:  Patient Patient Interpreter Needed:  None Criminal Activity/Legal Involvement Pertinent to Current Situation/Hospitalization:  No - Comment as needed Significant Relationships:  Spouse Lives with:  Facility Resident Do you feel safe going back to the place where you live?  Yes Need for family participation in patient care:  Yes (Depdendent with mobility)  Care giving concerns:   No concerns presented.   Social Worker assessment / plan:  Patient lethargic, CSW spoke with patient spouse and facility nurse staff to gather information. Patient spouse reports the patient has lived in Skilled Nursing facility for about two years after having a stroke. [place], [place] during [place] in [place]. He reports the patient should return to SNF at discharge.  CSW talk with facility nursing staff-Christy. She reports the patient is alert and oriented x4. The patient knows when to take her mediciations. She uses a wheelchair and propels self. The staff uses a "standup lift to assist with her bathing's."  Patient will need FL2 closer to D/C.   Plan: return to SNF.   Employment status:  Disabled (Comment on whether or not currently receiving Disability) Insurance information:  [place]  Recommendations:  Skilled Nursing Facility Information / Referral to community resources:  Skilled Nursing Facility  Patient/Family's Response to care:  Patient spouse appreciative of CSW services.   Patient/Family's Understanding of and Emotional Response to Diagnosis, Current Treatment, and Prognosis:  Patient spouse is learning more about the patient current diagnosis.    Emotional Assessment Appearance:  Appears stated age Attitude/Demeanor/Rapport:    Affect (typically observed):  Unable to Assess Orientation:  (Patient is alert and oriented x4 at baseline. ) Alcohol / Substance use:  Not Applicable Psych involvement (Current and /or in the community):  No (Comment)  Discharge Needs  Concerns to be addressed:  Discharge Planning Concerns Readmission within the last 30 days:  No Current discharge risk:  Dependent with Mobility Barriers to Discharge:  Continued Medical Work up   [employer] A Torii Royse, LCSW 11/08/2017, 4:03 PM
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
## 2017-11-08 NOTE — Progress Notes (Signed)
Initial Nutrition Assessment  DOCUMENTATION CODES:   Obesity unspecified  INTERVENTION:   Monitor and provide recommendations for nutrition supplements or nutrition support as medically appropriate          NUTRITION DIAGNOSIS:   Inadequate oral intake related to inability to eat as evidenced by NPO status.   GOAL:   Patient will meet greater than or equal to 90% of their needs   MONITOR:   Diet advancement, Labs, Weight trends, Skin, I & O's  REASON FOR ASSESSMENT:   Low Braden    ASSESSMENT:   62 yo female admitted from SNF on 3/24 with abdominal pain secondary to sepsis, AMS. PMH HTN, stroke, DM  Per H&P note 3/25:  Pt hemodynamically stable Cause of sepsis undetermined with results of blood cultures pending  3/25 Attending MD assessed pt with pt's brother and sister-in-law in the room   Pt is currently unable to effectively communicate verbally (only head nods and single word responses with a lot of effort); this is worse from 3/24 when pt could communicate with hospital staff  Pt did not have AMS and could self-feed PTA according to brother/sister-in-law. However, pt appears to have some limited mobility on left side of body s/p stroke  [place]  The pt's healthcare power of attorney unclear (husband vs family)  Pt is in pain, unable to verbalize location  Results of urine screen positive for infection. AMS possibly d/t UTI as CT scan found no etiology for AMS  MD team trying to determine if pt is full code as she needs additional breathing support at this time   Unable to obtain much information as the brother/sister-in-law did not know pt's daily routine or status immediately PTA, but they report that they think she was eating well.   Medications: insulin (moderate scale); IV NaCl @ 125 ml/hr, zosyn/vanco  Labs: CBG's 172, 155, 115, 159; HgbA1c 6.7   Wt Readings  from Last 15 Encounters:  11/08/17 200 lb 2.8 oz (90.8 kg)     NUTRITION - FOCUSED PHYSICAL EXAM:    Most Recent Value  Orbital Region  No depletion  Upper Arm Region  No depletion  Thoracic and Lumbar Region  No depletion  Buccal Region  No depletion  Temple Region  No depletion  Clavicle Bone Region  No depletion  Clavicle and Acromion Bone Region  No depletion  Scapular Bone Region  No depletion  Dorsal Hand  No depletion  Patellar Region  No depletion  Anterior Thigh Region  No depletion  Posterior Calf Region  No depletion  Edema (RD Assessment)  Mild [possibly d/t fluid status]  Hair  Reviewed  Eyes  Reviewed  Mouth  Reviewed  Skin  Reviewed  Nails  Reviewed       Diet Order:  Diet NPO time specified Except for: Sips with Meds  EDUCATION NEEDS:   Not appropriate for education at this time  Skin:  Other: excoriated marks BL arms with ecchymosis  Last BM:  None reported since admission  Height:   Ht Readings from Last 1 Encounters:  11/08/17 5\' 4"  (1.626 m)    Weight:   Wt Readings from Last 1 Encounters:  11/08/17 200 lb 2.8 oz (90.8 kg)    Ideal Body Weight:  54.5 kg  BMI:  Body mass index is 34.36 kg/m.  Estimated Nutritional Needs:   Kcal:  1750-1950 kcal (MSJ * 1.2-1.35  rounded  Protein:  88-98 grams (20% kcal)  Fluid:  >=1.8 L/day    Marjie Skiff Dietetic Intern Pager: [phone]

## 2017-11-08 NOTE — Procedures (Signed)
Arterial Catheter Insertion Procedure Note Grace [name] [id number] Oct 11, 1955  Procedure: Insertion of Arterial Catheter  Indications: Blood pressure monitoring and Frequent blood sampling  Procedure Details Consent: Unable to obtain consent because of emergent medical necessity. Time Out: Verified patient identification, verified procedure, site/side was marked, verified correct patient position, special equipment/implants available, medications/allergies/relevent history reviewed, required imaging and test results available.  Performed  Maximum sterile technique was used including antiseptics, gloves, hand hygiene and mask. Skin prep: Chlorhexidine; local anesthetic administered 20 gauge catheter was inserted into right radial artery using the Seldinger technique.  Evaluation Blood flow good; BP tracing good. Complications: No apparent complications.   Grace [name],Grace [name] 11/08/2017

## 2017-11-08 NOTE — ED Notes (Signed)
ED TO INPATIENT HANDOFF REPORT  Name/Age/Gender Grace [name] 62 y.o. female  Code Status    Code Status Orders  (From admission, onward)        Start     Ordered   11/07/17 2330  Full code  Continuous     11/07/17 2331    Code Status History    This patient has a current code status but no historical code status.      Home/SNF/Other Skilled nursing facility  Chief Complaint abdominal pains  Level of Care/Admitting Diagnosis ED Disposition    ED Disposition Condition [facility] Area: [place] [[id number]]  Level of Care: Stepdown [14]  Admit to SDU based on following criteria: Hemodynamic compromise or significant risk of instability:  Patient requiring short term acute titration and management of vasoactive drips, and invasive monitoring (i.e., CVP and Arterial line).  Diagnosis: Sepsis [facility]) [[id number]]  Admitting Physician: Elwin Mocha [[id number]]  Attending Physician: Isaiah Serge  Estimated length of stay: 3 - 4 days  Certification:: I certify this patient will need inpatient services for at least 2 midnights  PT Class (Do Not Modify): Inpatient [101]  PT Acc Code (Do Not Modify): Private [1]       Medical History Past Medical History:  Diagnosis Date  . Hypertension   . Stroke [facility])     Allergies No Known Allergies  IV Location/Drains/Wounds Patient Lines/Drains/Airways Status   Active Line/Drains/Airways    Name:   Placement date:   Placement time:   Site:   Days:   Peripheral IV 11/07/17 Right Antecubital   11/07/17    2249    Antecubital   1          Labs/Imaging Results for orders placed or performed during the hospital encounter of 11/07/17 (from the past 48 hour(s))  I-Stat CG4 Lactic Acid, ED  (not at  [facility])     Status: Abnormal   Collection Time: 11/07/17  7:07 PM  Result Value Ref Range   Lactic Acid, Venous 2.35 (HH) 0.5 - 1.9 mmol/L   Comment NOTIFIED PHYSICIAN    Comprehensive metabolic panel     Status: Abnormal   Collection Time: 11/07/17  8:36 PM  Result Value Ref Range   Sodium 141 135 - 145 mmol/L   Potassium 4.4 3.5 - 5.1 mmol/L   Chloride 99 (L) 101 - 111 mmol/L   CO2 29 22 - 32 mmol/L   Glucose, Bld 187 (H) 65 - 99 mg/dL   BUN 16 6 - 20 mg/dL   Creatinine, Ser 1.09 (H) 0.44 - 1.00 mg/dL   Calcium 10.4 (H) 8.9 - 10.3 mg/dL   Total Protein 8.6 (H) 6.5 - 8.1 g/dL   Albumin 3.9 3.5 - 5.0 g/dL   AST 23 15 - 41 U/L   ALT 16 14 - 54 U/L   Alkaline Phosphatase 112 38 - 126 U/L   Total Bilirubin 0.4 0.3 - 1.2 mg/dL   GFR calc non Af Amer 53 (L) >60 mL/min   GFR calc Af Amer >60 >60 mL/min    Comment: (NOTE) The eGFR has been calculated using the CKD EPI equation. This calculation has not been validated in all clinical situations. eGFR's persistently <60 mL/min signify possible Chronic Kidney Disease.    Anion gap 13 5 - 15    Comment: Performed at [facility], [place] [address], [place], [place] [address]  CBC WITH DIFFERENTIAL  Status: Abnormal   Collection Time: 11/07/17  8:36 PM  Result Value Ref Range   WBC 11.7 (H) 4.0 - 10.5 K/uL   RBC 4.77 3.87 - 5.11 MIL/uL   Hemoglobin 13.2 12.0 - 15.0 g/dL   HCT 42.1 36.0 - 46.0 %   MCV 88.3 78.0 - 100.0 fL   MCH 27.7 26.0 - 34.0 pg   MCHC 31.4 30.0 - 36.0 g/dL   RDW 15.3 11.5 - 15.5 %   Platelets 433 (H) 150 - 400 K/uL   Neutrophils Relative % 79 %   Neutro Abs 9.3 (H) 1.7 - 7.7 K/uL   Lymphocytes Relative 12 %   Lymphs Abs 1.3 0.7 - 4.0 K/uL   Monocytes Relative 9 %   Monocytes Absolute 1.1 (H) 0.1 - 1.0 K/uL   Eosinophils Relative 0 %   Eosinophils Absolute 0.0 0.0 - 0.7 K/uL   Basophils Relative 0 %   Basophils Absolute 0.0 0.0 - 0.1 K/uL    Comment: Performed at [facility] [place] [facility], [place] [address], [place], [place] [address]  I-Stat Troponin, ED (not at [facility])     Status: Abnormal   Collection Time: 11/07/17 10:54 PM  Result Value Ref Range    Troponin i, poc 0.23 (HH) 0.00 - 0.08 ng/mL   Comment NOTIFIED PHYSICIAN    Comment 3            Comment: Due to the release kinetics of cTnI, a negative result within the first hours of the onset of symptoms does not rule out myocardial infarction with certainty. If myocardial infarction is still suspected, repeat the test at appropriate intervals.   I-Stat CG4 Lactic Acid, ED  (not at  [facility])     Status: Abnormal   Collection Time: 11/07/17 10:55 PM  Result Value Ref Range   Lactic Acid, Venous 2.29 (HH) 0.5 - 1.9 mmol/L   Comment NOTIFIED PHYSICIAN    Ct Abdomen Pelvis Wo Contrast  Result Date: 11/07/2017 CLINICAL DATA:  Abdominal pain EXAM: CT ABDOMEN AND PELVIS WITHOUT CONTRAST TECHNIQUE: Multidetector CT imaging of the abdomen and pelvis was performed following the standard protocol without IV contrast. The examination was ordered as a CT angiography study, but there was infiltration during the contrast injection. COMPARISON:  None. FINDINGS: Lower chest: Coronary artery calcifications.  No pleural effusion. Hepatobiliary: Normal hepatic contours and density. No intra- or extrahepatic biliary dilatation. Cholelithiasis without acute inflammation. Pancreas: Normal parenchymal contours without ductal dilatation. No peripancreatic fluid collection. Spleen: Small spleen. Adrenals/Urinary Tract: --Adrenal glands: Normal. --Right kidney/ureter: Suspected excretion of contrast. Otherwise normal kidneys. --Left kidney/ureter: Suspected excretion of contrast, otherwise normal kidneys. --Urinary bladder: Small amount of excreted contrast in the urinary bladder. Stomach/Bowel: --Stomach/Duodenum: No hiatal hernia or other gastric abnormality. Normal duodenal course. --Small bowel: No dilatation or inflammation. --Colon: No focal abnormality. --Appendix: Normal. Vascular/Lymphatic: Atherosclerotic calcification is present within the non-aneurysmal abdominal aorta, without hemodynamically significant  stenosis. No abdominal or pelvic lymphadenopathy. Reproductive: Status post hysterectomy. No adnexal mass. Musculoskeletal. No bony spinal canal stenosis or focal osseous abnormality. Other: None. IMPRESSION: 1. No acute abnormality of the abdomen or pelvis. 2. Aortic Atherosclerosis (ICD10-I70.0). Coronary artery atherosclerosis. Electronically Signed   By: Ulyses Jarred M.D.   On: 11/07/2017 22:41   Dg Chest 2 View  Result Date: 11/07/2017 CLINICAL DATA:  Abdominal pain and shortness of Breath EXAM: CHEST - 2 VIEW COMPARISON:  None. FINDINGS: Cardiac shadow is mildly enlarged. The lungs are well aerated bilaterally. No sizable effusion  is seen. Downward displacement of the left humeral head is noted. This is likely related to some chronic subluxation. Clinical correlation is recommended. IMPRESSION: No acute abnormality in the chest. Downward displacement of the left humeral head likely related to subluxation. Correlate with the physical exam. Electronically Signed   By: Inez Catalina M.D.   On: 11/07/2017 19:08   Dg Abd 2 Views  Result Date: 11/07/2017 CLINICAL DATA:  Diffuse abdominal pain EXAM: ABDOMEN - 2 VIEW COMPARISON:  None. FINDINGS: Scattered large and small bowel gas is noted. Mild retained fecal material is seen. No obstructive changes are noted. No free air is seen. No acute bony abnormality is noted. IMPRESSION: No acute abnormality seen. Electronically Signed   By: Inez Catalina M.D.   On: 11/07/2017 19:10    Pending Labs Unresulted Labs (From admission, onward)   Start     Ordered   11/14/17 0500  Creatinine, serum  (enoxaparin (LOVENOX)    CrCl >/= 30 ml/min)  Weekly,   R    Comments:  while on enoxaparin therapy    11/07/17 2331   11/09/17 0500  Creatinine, serum  Daily,   R     11/08/17 0007   11/08/17 7253  Basic metabolic panel  Tomorrow morning,   R     11/07/17 2331   11/08/17 0500  CBC  Tomorrow morning,   R     11/07/17 2331   11/08/17 0200  Lactic acid, plasma  STAT  Now then every 3 hours,   R     11/07/17 2326   11/07/17 2330  HIV antibody (Routine Testing)  Once,   R     11/07/17 2331   11/07/17 2330  CBC  (enoxaparin (LOVENOX)    CrCl >/= 30 ml/min)  Once,   R    Comments:  Baseline for enoxaparin therapy IF NOT ALREADY DRAWN.  Notify MD if PLT < 100 K.    11/07/17 2331   11/07/17 2330  Creatinine, serum  (enoxaparin (LOVENOX)    CrCl >/= 30 ml/min)  Once,   R    Comments:  Baseline for enoxaparin therapy IF NOT ALREADY DRAWN.    11/07/17 2331   11/07/17 1823  Blood Culture (routine x 2)  BLOOD CULTURE X 2,   STAT     11/07/17 1822   11/07/17 1823  Urinalysis, Routine w reflex microscopic  STAT,   STAT     11/07/17 1822      Vitals/Pain Today's Vitals   11/07/17 1815 11/07/17 2221 11/08/17 0037 11/08/17 0100  BP:  (!) 201/90 (!) 190/105 (!) 191/95  Pulse:  (!) 118 (!) 111 (!) 114  Resp:  (!) 21 (!) 24 (!) 26  Temp:      TempSrc:      SpO2:  92% 99% 97%  Weight: 280 lb (127 kg)       Isolation Precautions No active isolations  Medications Medications  iopamidol (ISOVUE-370) 76 % injection 100 mL (has no administration in time range)  piperacillin-tazobactam (ZOSYN) IVPB 3.375 g (has no administration in time range)  hydrALAZINE (APRESOLINE) injection 10 mg (10 mg Intravenous Given 11/08/17 0115)  enoxaparin (LOVENOX) injection 40 mg (has no administration in time range)  sodium chloride flush (NS) 0.9 % injection 3 mL (3 mLs Intravenous Given 11/08/17 0116)  0.9 %  sodium chloride infusion (has no administration in time range)  acetaminophen (TYLENOL) tablet 650 mg (has no administration in time range)    Or  acetaminophen (TYLENOL) suppository 650 mg (has no administration in time range)  ondansetron (ZOFRAN) tablet 4 mg (has no administration in time range)    Or  ondansetron (ZOFRAN) injection 4 mg (has no administration in time range)  insulin aspart (novoLOG) injection 0-15 Units (has no administration in time range)   atorvastatin (LIPITOR) tablet 30 mg (has no administration in time range)  clopidogrel (PLAVIX) tablet 75 mg (has no administration in time range)  nitroGLYCERIN (NITROSTAT) SL tablet 0.4 mg (has no administration in time range)  fentaNYL (SUBLIMAZE) injection 50 mcg (50 mcg Intravenous Given 11/07/17 2055)  vancomycin (VANCOCIN) IVPB 1000 mg/200 mL premix (0 mg Intravenous Stopped 11/07/17 2345)  piperacillin-tazobactam (ZOSYN) IVPB 3.375 g (0 g Intravenous Stopped 11/07/17 2131)  ondansetron (ZOFRAN) injection 4 mg (4 mg Intravenous Given 11/07/17 2123)  sodium chloride 0.9 % bolus 1,000 mL (0 mLs Intravenous Stopped 11/08/17 0102)  fentaNYL (SUBLIMAZE) injection 50 mcg (50 mcg Intravenous Given 11/07/17 2332)  acetaminophen (TYLENOL) suppository 650 mg (650 mg Rectal Given 11/08/17 0005)  sodium chloride 0.9 % bolus 1,000 mL (1,000 mLs Intravenous New Bag/Given 11/08/17 0116)    Followed by  sodium chloride 0.9 % bolus 1,000 mL (1,000 mLs Intravenous New Bag/Given 11/08/17 0115)    Followed by  sodium chloride 0.9 % bolus 500 mL (500 mLs Intravenous New Bag/Given 11/08/17 0116)    Mobility non-ambulatory

## 2017-11-08 NOTE — Progress Notes (Signed)
PROGRESS NOTE  Grace [name] ZOX:[id number] DOB: Mar 25, 1956 DOA: 11/07/2017 PCP: Justin Mend, MD  HPI/Recap of past 24 hours:  Not alert, pinpoint pupil Family at bedisde  Assessment/Plan: Active Problems:   Sepsis (HCC)  Encephalopathy of unclear etiology -ct no acute cva -hypertensive encephalopathy? Infection? -abg ph 7.29, co2 60 -critical care consulted due to concerns of not able protect her airway   Abdominal pain: (persenting symptom) Ct without contrast no acute findings. on exam her abdomen is soft, pain is out of proportion of exam, concerns for ischemic pain Will get CTA ab/pel  Fever, sinus tachycardia, tachypnea; lactic acidosis concern for sepsis on presentation Unclear source, per RN patient urine has strong odor ua + bacteria, trace leuk, negative nitrite Blood culture in process,  Ct ab, cxr no acute source of infection mrsa screen + She is started on vanc/zosyn since admission  HTN urgency: sbp remain elevated above 180 despite scheduled iv lopressor, change to nicardipine drip    Troponin elevation, no acute EKG changes, echo no wall motion abnormality, lvef wnl likley demand ischemia  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  From SNF, per family , patient is aaox3 at baseline and no speech problem at baseline  Body mass index is 34.36 kg/m.    Code Status: full  Family Communication: patient , husband over the phone, brother at bedside   Disposition Plan: remain in icu   Consultants:  Critical care  Procedures:  Pending intubation  Antibiotics:  vanc/zosyn   Objective: BP (!) 186/90   Pulse 92   Temp 98.1 F (36.7 C) (Oral)   Resp 16   Ht 5\' 4"  (1.626 m)   Wt 90.8 kg (200 lb 2.8 oz)   SpO2 100%   BMI 34.36 kg/m   Intake/Output Summary (Last 24 hours) at 11/08/2017 0751 Last data filed at 11/08/2017 0500 Gross per 24 hour    Intake 1533.33 ml  Output -  Net 1533.33 ml   Filed Weights   11/07/17 1815 11/08/17 0308  Weight: 127 kg (280 lb) 90.8 kg (200 lb 2.8 oz)    Exam: Patient is examined daily including today on 11/08/2017, exams remain the same as of yesterday except that has changed    General:  Drowsy, confused  Cardiovascular: RRR  Respiratory: CTABL  Abdomen: Soft/ND/NT, positive BS  Musculoskeletal: No Edema  Neuro: confused, left hemiparesis   Data Reviewed: Basic Metabolic Panel: Recent Labs  Lab 11/07/17 2036 11/08/17 0245  NA 141 143  K 4.4 4.3  CL 99* 106  CO2 29 26  GLUCOSE 187* 174*  BUN 16 16  CREATININE 1.09* 0.95  CALCIUM 10.4* 9.1   Liver Function Tests: Recent Labs  Lab 11/07/17 2036  AST 23  ALT 16  ALKPHOS 112  BILITOT 0.4  PROT 8.6*  ALBUMIN 3.9   No results for input(s): LIPASE, AMYLASE in the last 168 hours. No results for input(s): AMMONIA in the last 168 hours. CBC: Recent Labs  Lab 11/07/17 2036 11/08/17 0245  WBC 11.7* 14.3*  NEUTROABS 9.3*  --   HGB 13.2 12.4  HCT 42.1 40.3  MCV 88.3 90.8  PLT 433* 439*   Cardiac Enzymes:   No results for input(s): CKTOTAL, CKMB, CKMBINDEX, TROPONINI in the last 168 hours. BNP (last 3 results) No results for input(s): BNP in the last 8760 hours.  ProBNP (last 3 results) No results for input(s):  PROBNP in the last 8760 hours.  CBG: Recent Labs  Lab 11/08/17 0125 11/08/17 0244 11/08/17 0728  GLUCAP 172* 155* 115*    No results found for this or any previous visit (from the past 240 hour(s)).   Studies: Ct Abdomen Pelvis Wo Contrast  Result Date: 11/07/2017 CLINICAL DATA:  Abdominal pain EXAM: CT ABDOMEN AND PELVIS WITHOUT CONTRAST TECHNIQUE: Multidetector CT imaging of the abdomen and pelvis was performed following the standard protocol without IV contrast. The examination was ordered as a CT angiography study, but there was infiltration during the contrast injection. COMPARISON:  None.  FINDINGS: Lower chest: Coronary artery calcifications.  No pleural effusion. Hepatobiliary: Normal hepatic contours and density. No intra- or extrahepatic biliary dilatation. Cholelithiasis without acute inflammation. Pancreas: Normal parenchymal contours without ductal dilatation. No peripancreatic fluid collection. Spleen: Small spleen. Adrenals/Urinary Tract: --Adrenal glands: Normal. --Right kidney/ureter: Suspected excretion of contrast. Otherwise normal kidneys. --Left kidney/ureter: Suspected excretion of contrast, otherwise normal kidneys. --Urinary bladder: Small amount of excreted contrast in the urinary bladder. Stomach/Bowel: --Stomach/Duodenum: No hiatal hernia or other gastric abnormality. Normal duodenal course. --Small bowel: No dilatation or inflammation. --Colon: No focal abnormality. --Appendix: Normal. Vascular/Lymphatic: Atherosclerotic calcification is present within the non-aneurysmal abdominal aorta, without hemodynamically significant stenosis. No abdominal or pelvic lymphadenopathy. Reproductive: Status post hysterectomy. No adnexal mass. Musculoskeletal. No bony spinal canal stenosis or focal osseous abnormality. Other: None. IMPRESSION: 1. No acute abnormality of the abdomen or pelvis. 2. Aortic Atherosclerosis (ICD10-I70.0). Coronary artery atherosclerosis. Electronically Signed   By: Deatra RobinsonKevin  Herman M.D.   On: 11/07/2017 22:41   Dg Chest 2 View  Result Date: 11/07/2017 CLINICAL DATA:  Abdominal pain and shortness of Breath EXAM: CHEST - 2 VIEW COMPARISON:  None. FINDINGS: Cardiac shadow is mildly enlarged. The lungs are well aerated bilaterally. No sizable effusion is seen. Downward displacement of the left humeral head is noted. This is likely related to some chronic subluxation. Clinical correlation is recommended. IMPRESSION: No acute abnormality in the chest. Downward displacement of the left humeral head likely related to subluxation. Correlate with the physical exam.  Electronically Signed   By: Alcide CleverMark  Lukens M.D.   On: 11/07/2017 19:08   Ct Head Wo Contrast  Result Date: 11/08/2017 CLINICAL DATA:  62 year old female with altered mental status. EXAM: CT HEAD WITHOUT CONTRAST TECHNIQUE: Contiguous axial images were obtained from the base of the skull through the vertex without intravenous contrast. COMPARISON:  None. FINDINGS: Evaluation of this exam is limited due to motion artifact. Brain: There is mild age-related atrophy and chronic microvascular ischemic changes. There is no acute intracranial hemorrhage. No mass effect or midline shift. No extra-axial fluid collection. Vascular: Atherosclerotic calcification of the cerebral vasculature. Skull: Normal. Negative for fracture or focal lesion. Sinuses/Orbits: No acute finding. Other: None IMPRESSION: No definite acute intracranial hemorrhage on this severely motion degraded exam. Electronically Signed   By: Elgie CollardArash  Radparvar M.D.   On: 11/08/2017 02:35   Dg Chest Port 1 View  Result Date: 11/08/2017 CLINICAL DATA:  62 year old female with hypoxia. EXAM: PORTABLE CHEST 1 VIEW COMPARISON:  Chest radiograph dated 11/07/2017 FINDINGS: Shallow inspiration with minimal bibasilar atelectasis. No focal consolidation, pleural effusion, or pneumothorax. There is borderline cardiomegaly with mild central vascular prominence which may represent mild vascular congestion. Chronic subluxed appearance of the left shoulder. No acute osseous pathology. IMPRESSION: Probable mild vascular congestion.  No focal consolidation. Electronically Signed   By: Elgie CollardArash  Radparvar M.D.   On: 11/08/2017 05:20   Dg Abd 2  Views  Result Date: 11/07/2017 CLINICAL DATA:  Diffuse abdominal pain EXAM: ABDOMEN - 2 VIEW COMPARISON:  None. FINDINGS: Scattered large and small bowel gas is noted. Mild retained fecal material is seen. No obstructive changes are noted. No free air is seen. No acute bony abnormality is noted. IMPRESSION: No acute abnormality seen.  Electronically Signed   By: Alcide Clever M.D.   On: 11/07/2017 19:10    Scheduled Meds: . atorvastatin  30 mg Oral QHS  . bisacodyl  10 mg Rectal Daily  . clopidogrel  75 mg Oral Daily  . enoxaparin (LOVENOX) injection  40 mg Subcutaneous Q24H  . insulin aspart  0-15 Units Subcutaneous Q4H  . senna-docusate  1 tablet Oral BID  . sodium chloride flush  3 mL Intravenous Q12H    Continuous Infusions: . sodium chloride 125 mL/hr at 11/08/17 0500  . piperacillin-tazobactam (ZOSYN)  IV 3.375 g (11/08/17 0308)  . vancomycin      Total Critical Care time in examining the patient bedside, evaluating Lab work and other data, over half of the total time was spent in coordinating patient care on the floor or bedisde in talking to patient/family members, communicating with nursing Staff on the floor and sub specialists critical cre to coordinate patients medical care and needs is 65 Minutes.  The condition which has caused critical injury/acute impairment of vital organ system with a high probability of sudden clinically significant deterioration and can cause Potential Life threatening injury to this patient addressed today is sepsis, encephalopathy, not able to protect airway  I have personally reviewed and interpreted on  11/08/2017 daily labs, tele strips, imagings as discussed above under date review session and assessment and plans.  I reviewed all nursing notes, pharmacy notes, consultant notes,  vitals, pertinent old records  I have discussed plan of care as described above with RN , patient and family on 11/08/2017   Albertine Grates MD, PhD  [facility] Pager [phone]. If 7PM-7AM, please contact night-coverage at [email or web address], password [facility] 11/08/2017, 7:51 AM  LOS: 1 day

## 2017-11-08 NOTE — Progress Notes (Signed)
eLink Physician-Brief Progress Note Patient Name: Grace LefevreGwendolyn [name] DOB: 05-Jul-1956 MRN: [id number]   Date of Service  11/08/2017  HPI/Events of Note  Patient is intubated and ventilated and is admitted to stepdown status.   eICU Interventions  Will transfer patient to ICU status.      Intervention Category Major Interventions: Other:  Sommer,Steven Dennard Nipugene 11/08/2017, 9:08 PM

## 2017-11-09 ENCOUNTER — Inpatient Hospital Stay (HOSPITAL_COMMUNITY): Payer: Medicaid Other

## 2017-11-09 ENCOUNTER — Inpatient Hospital Stay (HOSPITAL_COMMUNITY): Payer: Medicaid Other | Admitting: Anesthesiology

## 2017-11-09 ENCOUNTER — Encounter (HOSPITAL_COMMUNITY): Payer: Self-pay | Admitting: *Deleted

## 2017-11-09 LAB — GLUCOSE, CAPILLARY
GLUCOSE-CAPILLARY: 111 mg/dL — AB (ref 65–99)
Glucose-Capillary: 112 mg/dL — ABNORMAL HIGH (ref 65–99)
Glucose-Capillary: 144 mg/dL — ABNORMAL HIGH (ref 65–99)
Glucose-Capillary: 82 mg/dL (ref 65–99)
Glucose-Capillary: 94 mg/dL (ref 65–99)

## 2017-11-09 LAB — HEPATIC FUNCTION PANEL
ALK PHOS: 65 U/L (ref 38–126)
ALT: 12 U/L — ABNORMAL LOW (ref 14–54)
AST: 20 U/L (ref 15–41)
Albumin: 2.7 g/dL — ABNORMAL LOW (ref 3.5–5.0)
BILIRUBIN DIRECT: 0.1 mg/dL (ref 0.1–0.5)
BILIRUBIN INDIRECT: 0.6 mg/dL (ref 0.3–0.9)
BILIRUBIN TOTAL: 0.7 mg/dL (ref 0.3–1.2)
Total Protein: 6.1 g/dL — ABNORMAL LOW (ref 6.5–8.1)

## 2017-11-09 LAB — BASIC METABOLIC PANEL
Anion gap: 7 (ref 5–15)
BUN: 13 mg/dL (ref 6–20)
CALCIUM: 8.1 mg/dL — AB (ref 8.9–10.3)
CO2: 26 mmol/L (ref 22–32)
CREATININE: 1.01 mg/dL — AB (ref 0.44–1.00)
Chloride: 106 mmol/L (ref 101–111)
GFR calc Af Amer: >60 mL/min (ref >60)
GFR calc non Af Amer: 58 mL/min — ABNORMAL LOW (ref >60)
Glucose, Bld: 166 mg/dL — ABNORMAL HIGH (ref 65–99)
Potassium: 3.9 mmol/L (ref 3.5–5.1)
Sodium: 139 mmol/L (ref 135–145)

## 2017-11-09 LAB — DIFFERENTIAL
Basophils Absolute: 0 10*3/uL (ref 0.0–0.1)
Basophils Relative: 0 %
EOS PCT: 1 %
Eosinophils Absolute: 0.1 10*3/uL (ref 0.0–0.7)
LYMPHS ABS: 2.3 10*3/uL (ref 0.7–4.0)
LYMPHS PCT: 24 %
MONO ABS: 1.2 10*3/uL — AB (ref 0.1–1.0)
MONOS PCT: 13 %
Neutro Abs: 6 10*3/uL (ref 1.7–7.7)
Neutrophils Relative %: 62 %

## 2017-11-09 LAB — CBC
HCT: 30.6 % — ABNORMAL LOW (ref 36.0–46.0)
Hemoglobin: 9.4 g/dL — ABNORMAL LOW (ref 12.0–15.0)
MCH: 28 pg (ref 26.0–34.0)
MCHC: 30.7 g/dL (ref 30.0–36.0)
MCV: 91.1 fL (ref 78.0–100.0)
Platelets: 301 10*3/uL (ref 150–400)
RBC: 3.36 MIL/uL — ABNORMAL LOW (ref 3.87–5.11)
RDW: 15.7 % — AB (ref 11.5–15.5)
WBC: 9.6 10*3/uL (ref 4.0–10.5)

## 2017-11-09 LAB — PHOSPHORUS: Phosphorus: 2.7 mg/dL (ref 2.5–4.6)

## 2017-11-09 LAB — MAGNESIUM: Magnesium: 1.8 mg/dL (ref 1.7–2.4)

## 2017-11-09 LAB — TSH: TSH: 2.521 u[IU]/mL (ref 0.350–4.500)

## 2017-11-09 MED ORDER — DEXMEDETOMIDINE HCL IN NACL 400 MCG/100ML IV SOLN
0.0000 ug/kg/h | INTRAVENOUS | Status: DC
  Administered 2017-11-09: 1 ug/kg/h via INTRAVENOUS
  Administered 2017-11-09: 0.9 ug/kg/h via INTRAVENOUS
  Filled 2017-11-09 (×2): qty 100

## 2017-11-09 MED ORDER — METOPROLOL TARTRATE 25 MG/10 ML ORAL SUSPENSION
25.0000 mg | Freq: Once | ORAL | Status: AC
  Administered 2017-11-09: 25 mg via ORAL
  Filled 2017-11-09: qty 10

## 2017-11-09 MED ORDER — PROPOFOL 10 MG/ML IV BOLUS
INTRAVENOUS | Status: AC
  Filled 2017-11-09: qty 20

## 2017-11-09 MED ORDER — METOPROLOL TARTRATE 25 MG/10 ML ORAL SUSPENSION
50.0000 mg | Freq: Two times a day (BID) | ORAL | Status: DC
  Administered 2017-11-09 – 2017-11-14 (×10): 50 mg via ORAL
  Filled 2017-11-09 (×12): qty 20

## 2017-11-09 MED ORDER — LIP MEDEX EX OINT
TOPICAL_OINTMENT | CUTANEOUS | Status: AC
  Administered 2017-11-09: 1 via OROMUCOSAL
  Filled 2017-11-09: qty 7

## 2017-11-09 MED ORDER — PROPOFOL 10 MG/ML IV BOLUS
INTRAVENOUS | Status: DC | PRN
  Administered 2017-11-09: 100 mg via INTRAVENOUS

## 2017-11-09 MED ORDER — DEXMEDETOMIDINE HCL IN NACL 200 MCG/50ML IV SOLN
0.4000 ug/kg/h | INTRAVENOUS | Status: DC
  Administered 2017-11-09 – 2017-11-10 (×3): 1 ug/kg/h via INTRAVENOUS
  Filled 2017-11-09 (×4): qty 50

## 2017-11-09 MED ORDER — SODIUM CHLORIDE 0.9 % IV SOLN: 0.0000 ug/kg/h | INTRAVENOUS | Status: DC

## 2017-11-09 MED ORDER — CHLORHEXIDINE GLUCONATE CLOTH 2 % EX PADS
6.0000 | MEDICATED_PAD | Freq: Every day | CUTANEOUS | Status: AC
  Administered 2017-11-09 – 2017-11-12 (×4): 6 via TOPICAL

## 2017-11-09 MED ORDER — FREE WATER: 100.0000 mL | Freq: Four times a day (QID) | Status: DC

## 2017-11-09 MED ORDER — PRO-STAT SUGAR FREE PO LIQD: 30.0000 mL | Freq: Every day | ORAL | Status: DC

## 2017-11-09 MED ORDER — VITAL HIGH PROTEIN PO LIQD
1000.0000 mL | ORAL | Status: DC
  Filled 2017-11-09: qty 1000

## 2017-11-09 NOTE — Progress Notes (Signed)
Pt transported on vent 100% fio2, from ICU 1226 to MRI and back   Pt tolerated transport well without incident.

## 2017-11-09 NOTE — Progress Notes (Signed)
Patient had aline redressed and wrist restraint reapplied and teid to bed on right side.  Mitten was removed per RT when Aline redressed.  Patient self extubated ETT, and pulled OG tube out also.  Placed patient on 100% NRB mask.  Changed to 3L/[place] at 14:20 and tolerating [place] fine.  Continue to monitor patient closely.  Niala Stcharles Debroah LoopArnold RN

## 2017-11-09 NOTE — Anesthesia Procedure Notes (Signed)
Procedure Name: Intubation Date/Time: 11/09/2017 6:26 PM Performed by: Anne Fu, CRNA Pre-anesthesia Checklist: Patient identified, Emergency Drugs available, Suction available, Patient being monitored and Timeout performed Patient Re-evaluated:Patient Re-evaluated prior to induction Oxygen Delivery Method: Circle system utilized Preoxygenation: Pre-oxygenation with 100% oxygen Induction Type: IV induction Ventilation: Mask ventilation without difficulty Laryngoscope Size: Mac and 4 Grade View: Grade I Tube type: Oral Tube size: 7.5 mm Number of attempts: 1 Airway Equipment and Method: Stylet Placement Confirmation: ETT inserted through vocal cords under direct vision,  positive ETCO2 and breath sounds checked- equal and bilateral Secured at: 23 cm Tube secured with: Tape Dental Injury: Teeth and Oropharynx as per pre-operative assessment

## 2017-11-09 NOTE — Progress Notes (Signed)
RT called to bedside by RN. Patient acknowledges she is having trouble breathing. Patient placed on NIV at this time. Patient acknowledges breathing is "better". RT will continue to monitor patient.

## 2017-11-09 NOTE — Progress Notes (Signed)
Patient reintubated per anesthesia without problem.  PCXR taken.  OG replaced and xray taken for placement and attached to low intermittant suction.  Presidex restarted at 1 mg/hr via left arm.  R arm restrained for safety.  Continue to monitor patient closely. [place] Merry Debroah LoopArnold RN

## 2017-11-09 NOTE — Progress Notes (Signed)
Nutrition Follow-up  DOCUMENTATION CODES:   Obesity unspecified  INTERVENTION:  Will order TF: Vital High Protein @ 45 mL/hr with 30 mL Prostat once/day and 100 mL free water QID. This regimen will provide 1180 kcal, 109 grams of protein, and 1303 mL free water.   NUTRITION DIAGNOSIS:   Inadequate oral intake related to inability to eat as evidenced by NPO status. -ongoing  GOAL:   Provide needs based on ASPEN/SCCM guidelines -unmet at this time.   MONITOR:   Vent status, TF tolerance, Weight trends, Labs  REASON FOR ASSESSMENT:   Ventilator, Consult Enteral/tube feeding initiation and management  ASSESSMENT:   62 yo female admitted from SNF on 3/24 with abdominal pain secondary to sepsis, AMS. PMH HTN, stroke, DM  No new weight this AM. Estimated nutrition needs updated based on intubation. She was intubated around 4 PM yesterday and OGT placed. Reviewed report from abd x-ray yesterday which states "well positioned orogastric tube, tip projecting in the distal stomach." No family/visitors present at bedside at this time.  Per Dr. Percival SpanishYacoub's note this AM: plan to attempt to minimize Precedex, suspected demand ischemia with mildly elevated troponin, leukocytosis with monitoring for bleeding, possible UTI, acute encephalopathy with plan for brain MRI today at 8 PM.  Patient is currently intubated on ventilator support MV: 9.3 L/min Temp (24hrs), Avg:99 F (37.2 C), Min:98.2 F (36.8 C), Max:99.5 F (37.5 C) Propofol: none BP: 115/44 and MAP: 66  Medications reviewed; sliding scale Novolog. Labs reviewed; CBGs: 112 and 144 mg/dL this AM, creatinine: 1.611.01 mg/dL, Ca: 8.1 mg/dL.   Drip: Precedex @ 1 mcg/kg/hr.     Diet Order:  Diet NPO time specified Except for: Sips with Meds  EDUCATION NEEDS:   Not appropriate for education at this time  Skin:  Other: excoriated marks BL arms with ecchymosis  Last BM:  PTA/unknown  Height:   Ht Readings from Last 1  Encounters:  11/08/17 5\' 4"  (1.626 m)    Weight:   Wt Readings from Last 1 Encounters:  11/08/17 196 lb 6.9 oz (89.1 kg)    Ideal Body Weight:  54.5 kg  BMI:  Body mass index is 33.72 kg/m.  Estimated Nutritional Needs:   Kcal:  [phone] (11-14 kcal/kg actual weight)  Protein:  >'= 109 grams (2 grams/kg IBW)  Fluid:  >=1.8 L/day      Trenton GammonJessica Yeily Link, MS, RD, LDN, [facility] Inpatient Clinical Dietitian Pager # [phone] After hours/weekend pager # [phone]

## 2017-11-09 NOTE — Progress Notes (Signed)
Patient self-extubated at 1325. NRB placed on patient. No distress noted. RN at bedside
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
## 2017-11-09 NOTE — Transfer of Care (Signed)
Immediate Anesthesia Transfer of Care Note  Patient: Grace [name]  Procedure(s) Performed: AN AD HOC INTUBATION  Patient Location: ICU  Anesthesia Type:ICU Intubation  Level of Consciousness: sedated  Airway & Oxygen Therapy: Patient placed on Ventilator (see vital sign flow sheet for setting)  Post-op Assessment: Report given to RN and Post -op Vital signs reviewed and stable  Post vital signs: Reviewed and stable  Last Vitals:  Vitals Value Taken Time  BP 132/59 11/09/2017  6:52 PM  Temp 39.5 C 11/09/2017  6:54 PM  Pulse    Resp 18 11/09/2017  6:54 PM  SpO2 100 % 11/09/2017  6:54 PM  Vitals shown include unvalidated device data.  Last Pain:  Vitals:   11/09/17 0000  TempSrc: Core         Complications: No apparent anesthesia complications

## 2017-11-10 ENCOUNTER — Inpatient Hospital Stay (HOSPITAL_COMMUNITY): Payer: Medicaid Other

## 2017-11-10 LAB — CBC
HEMATOCRIT: 31.1 % — AB (ref 36.0–46.0)
Hemoglobin: 9.5 g/dL — ABNORMAL LOW (ref 12.0–15.0)
MCH: 27.9 pg (ref 26.0–34.0)
MCHC: 30.5 g/dL (ref 30.0–36.0)
MCV: 91.2 fL (ref 78.0–100.0)
Platelets: 358 10*3/uL (ref 150–400)
RBC: 3.41 MIL/uL — ABNORMAL LOW (ref 3.87–5.11)
RDW: 16.1 % — AB (ref 11.5–15.5)
WBC: 10.9 10*3/uL — ABNORMAL HIGH (ref 4.0–10.5)

## 2017-11-10 LAB — GLUCOSE, CAPILLARY
GLUCOSE-CAPILLARY: 108 mg/dL — AB (ref 65–99)
GLUCOSE-CAPILLARY: 135 mg/dL — AB (ref 65–99)
GLUCOSE-CAPILLARY: 102 mg/dL — AB (ref 65–99)
GLUCOSE-CAPILLARY: 112 mg/dL — AB (ref 65–99)
GLUCOSE-CAPILLARY: 116 mg/dL — AB (ref 65–99)
Glucose-Capillary: 108 mg/dL — ABNORMAL HIGH (ref 65–99)
Glucose-Capillary: 120 mg/dL — ABNORMAL HIGH (ref 65–99)

## 2017-11-10 LAB — BASIC METABOLIC PANEL
Anion gap: 8 (ref 5–15)
BUN: 13 mg/dL (ref 6–20)
CALCIUM: 8.9 mg/dL (ref 8.9–10.3)
CO2: 26 mmol/L (ref 22–32)
CREATININE: 0.93 mg/dL (ref 0.44–1.00)
Chloride: 105 mmol/L (ref 101–111)
GFR calc non Af Amer: >60 mL/min (ref >60)
Glucose, Bld: 113 mg/dL — ABNORMAL HIGH (ref 65–99)
Potassium: 4.7 mmol/L (ref 3.5–5.1)
SODIUM: 139 mmol/L (ref 135–145)

## 2017-11-10 LAB — BLOOD GAS, ARTERIAL
ACID-BASE EXCESS: 1.3 mmol/L (ref 0.0–2.0)
BICARBONATE: 25.2 mmol/L (ref 20.0–28.0)
Drawn by: 232811
FIO2: 40
LHR: 16 {breaths}/min
MECHVT: 440 mL
O2 Saturation: 99.2 %
PATIENT TEMPERATURE: 99.3
PCO2 ART: 40.1 mmHg (ref 32.0–48.0)
PEEP/CPAP: 5 cmH2O
PH ART: 7.417 (ref 7.350–7.450)
PO2 ART: 178 mmHg — AB (ref 83.0–108.0)

## 2017-11-10 LAB — MAGNESIUM: Magnesium: 2.2 mg/dL (ref 1.7–2.4)

## 2017-11-10 LAB — URINE CULTURE: Culture: >=100000 — AB

## 2017-11-10 LAB — PHOSPHORUS: PHOSPHORUS: 2.3 mg/dL — AB (ref 2.5–4.6)

## 2017-11-10 MED ORDER — FENTANYL 2500MCG IN NS 250ML (10MCG/ML) PREMIX INFUSION
25.0000 ug/h | INTRAVENOUS | Status: DC
  Administered 2017-11-10: 150 ug/h via INTRAVENOUS
  Administered 2017-11-10: 50 ug/h via INTRAVENOUS
  Administered 2017-11-11: 400 ug/h via INTRAVENOUS
  Filled 2017-11-10 (×4): qty 250

## 2017-11-10 MED ORDER — PRO-STAT SUGAR FREE PO LIQD
30.0000 mL | Freq: Every day | ORAL | Status: DC
  Administered 2017-11-10 – 2017-11-12 (×3): 30 mL
  Filled 2017-11-10 (×3): qty 30

## 2017-11-10 MED ORDER — SODIUM CHLORIDE 0.9 % IV SOLN
2.0000 g | INTRAVENOUS | Status: DC
  Administered 2017-11-10 – 2017-11-14 (×5): 2 g via INTRAVENOUS
  Filled 2017-11-10: qty 20
  Filled 2017-11-10 (×2): qty 2
  Filled 2017-11-10: qty 20
  Filled 2017-11-10 (×2): qty 2

## 2017-11-10 MED ORDER — MIDAZOLAM HCL 2 MG/2ML IJ SOLN
2.0000 mg | INTRAMUSCULAR | Status: DC | PRN
  Administered 2017-11-10 – 2017-11-13 (×12): 2 mg via INTRAVENOUS
  Filled 2017-11-10 (×14): qty 2

## 2017-11-10 MED ORDER — VITAL HIGH PROTEIN PO LIQD
1000.0000 mL | ORAL | Status: DC
  Administered 2017-11-10 – 2017-11-15 (×6): 1000 mL
  Filled 2017-11-10 (×9): qty 1000

## 2017-11-10 MED ORDER — FENTANYL CITRATE (PF) 100 MCG/2ML IJ SOLN: 50.0000 ug | Freq: Once | INTRAMUSCULAR | Status: DC

## 2017-11-10 MED ORDER — DEXMEDETOMIDINE HCL IN NACL 400 MCG/100ML IV SOLN
0.4000 ug/kg/h | INTRAVENOUS | Status: DC
  Administered 2017-11-10: 1.2 ug/kg/h via INTRAVENOUS
  Administered 2017-11-10: 0.7 ug/kg/h via INTRAVENOUS
  Administered 2017-11-10: 1 ug/kg/h via INTRAVENOUS
  Administered 2017-11-11: 0.7 ug/kg/h via INTRAVENOUS
  Administered 2017-11-11 – 2017-11-12 (×3): 1.2 ug/kg/h via INTRAVENOUS
  Administered 2017-11-12: 0.5 ug/kg/h via INTRAVENOUS
  Administered 2017-11-12: 0.8 ug/kg/h via INTRAVENOUS
  Administered 2017-11-12: 1.2 ug/kg/h via INTRAVENOUS
  Administered 2017-11-12: 1.1 ug/kg/h via INTRAVENOUS
  Administered 2017-11-13 – 2017-11-14 (×6): 1.2 ug/kg/h via INTRAVENOUS
  Filled 2017-11-10 (×20): qty 100

## 2017-11-10 MED ORDER — FUROSEMIDE 10 MG/ML IJ SOLN
20.0000 mg | Freq: Four times a day (QID) | INTRAMUSCULAR | Status: AC
  Administered 2017-11-10 (×3): 20 mg via INTRAVENOUS
  Filled 2017-11-10 (×3): qty 2

## 2017-11-10 MED ORDER — FREE WATER
100.0000 mL | Freq: Four times a day (QID) | Status: DC
  Administered 2017-11-10 – 2017-11-12 (×8): 100 mL

## 2017-11-10 MED ORDER — SODIUM CHLORIDE 0.9 % IV SOLN
25.0000 ug/h | INTRAVENOUS | Status: DC
  Filled 2017-11-10: qty 50

## 2017-11-10 MED ORDER — POTASSIUM PHOSPHATES 15 MMOLE/5ML IV SOLN
30.0000 mmol | Freq: Once | INTRAVENOUS | Status: AC
  Administered 2017-11-10: 30 mmol via INTRAVENOUS
  Filled 2017-11-10: qty 10

## 2017-11-10 MED ORDER — MIDAZOLAM HCL 2 MG/2ML IJ SOLN
2.0000 mg | INTRAMUSCULAR | Status: AC | PRN
  Administered 2017-11-10 (×3): 2 mg via INTRAVENOUS
  Filled 2017-11-10 (×3): qty 2

## 2017-11-10 MED ORDER — FENTANYL BOLUS VIA INFUSION
50.0000 ug | INTRAVENOUS | Status: DC | PRN
  Filled 2017-11-10: qty 50

## 2017-11-10 MED ORDER — DOCUSATE SODIUM 50 MG/5ML PO LIQD: 100.0000 mg | Freq: Two times a day (BID) | ORAL | Status: DC | PRN

## 2017-11-10 MED ORDER — BISACODYL 10 MG RE SUPP: 10.0000 mg | Freq: Every day | RECTAL | Status: DC | PRN
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
## 2017-11-10 NOTE — Progress Notes (Signed)
eLink Physician-Brief Progress Note Patient Name: Grace LefevreGwendolyn [name] DOB: 17-May-1956 MRN: [id number]   Date of Service  11/10/2017  HPI/Events of Note    eICU Interventions  PAD protocol with fent infusion     Intervention Category Major Interventions: Delirium, psychosis, severe agitation - evaluation and management  Merwyn KatosDavid B Jerran Tappan 11/10/2017, 2:52 AM

## 2017-11-10 NOTE — Progress Notes (Signed)
Nutrition Follow-up  DOCUMENTATION CODES:   Obesity unspecified  INTERVENTION:  Will order TF: Vital High Protein @ 45 mL/hr with 30 mL Prostat once/day and 100 mL free water QID. This regimen will provide 1180 kcal, 109 grams of protein, and 1303 mL free water.   NUTRITION DIAGNOSIS:   Inadequate oral intake related to inability to eat as evidenced by NPO status. -ongoing  GOAL:   Provide needs based on ASPEN/SCCM guidelines -met with TF regimen  MONITOR:   Vent status, TF tolerance, Weight trends, Labs  REASON FOR ASSESSMENT:   Consult Enteral/tube feeding initiation and management  ASSESSMENT:   62 yo female admitted from SNF on 3/24 with abdominal pain secondary to sepsis, AMS. PMH HTN, stroke, DM  Weight -1 lb/0.4 kg from 3/25-3/27. Pt intubated, sedated, with OGT in place. No family/visitors present at this time. Spoke with RN who reports that pt self-extubated and required reintubation. She was reintubated yesterday around 6:45 PM under anesthesia. OGT also replaced at that time.   Per Dr. Pura Spice note this AM: "family discussing trach/peg option given clinical picture." Restart TF today. Brain MRI did not show any new infarction.    Patient is currently intubated on ventilator support MV: 6.9 L/min Temp (24hrs), Avg:100.9 F (38.3 C), Min:97.7 F (36.5 C), Max:103.1 F (39.5 C) Propofol: none BP: 137/56 and MAP: 83  Medications reviewed; 20 mg IV Lasix QID, sliding scale Novolog, 30 mmol IV KPhos x1 run today.  Labs reviewed; Phos: 2.3 mg/dL.  Drips: Fentanyl @ 150 mcg/hr, Precedex @ 0.7 mcg/kg/hr.    Diet Order:  Diet NPO time specified Except for: Sips with Meds  EDUCATION NEEDS:   Not appropriate for education at this time  Skin:  Other: excoriated marks BL arms with ecchymosis  Last BM:  PTA/unknown  Height:   Ht Readings from Last 1 Encounters:  11/10/17 5' 4"  (1.626 m)    Weight:   Wt Readings from Last 1 Encounters:  11/10/17 195  lb 8.8 oz (88.7 kg)    Ideal Body Weight:  54.5 kg  BMI:  Body mass index is 33.57 kg/m.  Estimated Nutritional Needs:   Kcal:  [phone] (11-14 kcal/kg actual weight)  Protein:  >= 109 grams (2 grams/kg IBW)  Fluid:  >=1.8 L/day      Jarome Matin, MS, RD, LDN, [facility] Inpatient Clinical Dietitian Pager # [phone] After hours/weekend pager # [phone]

## 2017-11-11 ENCOUNTER — Inpatient Hospital Stay (HOSPITAL_COMMUNITY): Payer: Medicaid Other

## 2017-11-11 LAB — CBC
HEMATOCRIT: 31.6 % — AB (ref 36.0–46.0)
HEMOGLOBIN: 9.9 g/dL — AB (ref 12.0–15.0)
MCH: 28.2 pg (ref 26.0–34.0)
MCHC: 31.3 g/dL (ref 30.0–36.0)
MCV: 90 fL (ref 78.0–100.0)
Platelets: 320 10*3/uL (ref 150–400)
RBC: 3.51 MIL/uL — ABNORMAL LOW (ref 3.87–5.11)
RDW: 15.6 % — ABNORMAL HIGH (ref 11.5–15.5)
WBC: 8.9 10*3/uL (ref 4.0–10.5)

## 2017-11-11 LAB — MAGNESIUM: Magnesium: 1.8 mg/dL (ref 1.7–2.4)

## 2017-11-11 LAB — GLUCOSE, CAPILLARY
GLUCOSE-CAPILLARY: 139 mg/dL — AB (ref 65–99)
Glucose-Capillary: 116 mg/dL — ABNORMAL HIGH (ref 65–99)
Glucose-Capillary: 124 mg/dL — ABNORMAL HIGH (ref 65–99)
Glucose-Capillary: 127 mg/dL — ABNORMAL HIGH (ref 65–99)
Glucose-Capillary: 134 mg/dL — ABNORMAL HIGH (ref 65–99)
Glucose-Capillary: 170 mg/dL — ABNORMAL HIGH (ref 65–99)

## 2017-11-11 LAB — BLOOD GAS, ARTERIAL
ACID-BASE EXCESS: 0.9 mmol/L (ref 0.0–2.0)
Bicarbonate: 26.1 mmol/L (ref 20.0–28.0)
DRAWN BY: 232811
FIO2: 30
O2 Saturation: 96.9 %
PATIENT TEMPERATURE: 37.5
PCO2 ART: 48.8 mmHg — AB (ref 32.0–48.0)
PEEP: 5 cmH2O
PH ART: 7.35 (ref 7.350–7.450)
RATE: 16 resp/min
VT: 440 mL
pO2, Arterial: 101 mmHg (ref 83.0–108.0)

## 2017-11-11 LAB — BASIC METABOLIC PANEL
Anion gap: 9 (ref 5–15)
BUN: 15 mg/dL (ref 6–20)
CHLORIDE: 101 mmol/L (ref 101–111)
CO2: 26 mmol/L (ref 22–32)
CREATININE: 0.71 mg/dL (ref 0.44–1.00)
Calcium: 9.2 mg/dL (ref 8.9–10.3)
GFR calc Af Amer: >60 mL/min (ref >60)
GFR calc non Af Amer: >60 mL/min (ref >60)
GLUCOSE: 122 mg/dL — AB (ref 65–99)
Potassium: 4.3 mmol/L (ref 3.5–5.1)
Sodium: 136 mmol/L (ref 135–145)

## 2017-11-11 LAB — PHOSPHORUS: PHOSPHORUS: 3.3 mg/dL (ref 2.5–4.6)

## 2017-11-11 MED ORDER — AMLODIPINE BESYLATE 5 MG PO TABS
2.5000 mg | ORAL_TABLET | Freq: Every day | ORAL | Status: DC
  Administered 2017-11-11: 2.5 mg
  Filled 2017-11-11: qty 1

## 2017-11-11 MED ORDER — FUROSEMIDE 10 MG/ML IJ SOLN
20.0000 mg | Freq: Four times a day (QID) | INTRAMUSCULAR | Status: AC
  Administered 2017-11-11 (×3): 20 mg via INTRAVENOUS
  Filled 2017-11-11 (×3): qty 2

## 2017-11-11 MED ORDER — AMLODIPINE 1 MG/ML ORAL SUSPENSION
2.5000 mg | Freq: Every day | ORAL | Status: DC
Start: 2017-11-11 — End: 2017-11-11

## 2017-11-11 MED ORDER — HYDRALAZINE HCL 20 MG/ML IJ SOLN
10.0000 mg | INTRAMUSCULAR | Status: DC | PRN
  Administered 2017-11-11 – 2017-11-19 (×14): 10 mg via INTRAVENOUS
  Filled 2017-11-11 (×13): qty 1

## 2017-11-11 MED ORDER — MAGNESIUM SULFATE 2 GM/50ML IV SOLN
2.0000 g | Freq: Once | INTRAVENOUS | Status: AC
  Administered 2017-11-11: 2 g via INTRAVENOUS
  Filled 2017-11-11: qty 50

## 2017-11-11 NOTE — Care Management Note (Signed)
Case Management Note  Patient Details  Name: Jacalyn LefevreGwendolyn Bourke MRN: [id number] Date of Birth: 09-Aug-1956  Subjective/Objective: 62 y/o f admitted w/Sepsis. Full code.From SNF-[facility]. On vent. CSW already following.                   Action/Plan:d/c plan SNF.   Expected Discharge Date:  (unknown)               Expected Discharge Plan:  Skilled Nursing Facility  In-House Referral:  Clinical Social Work  Discharge planning Services  CM Consult  Post Acute Care Choice:    Choice offered to:     DME Arranged:    DME Agency:     HH Arranged:    HH Agency:     Status of Service:  In process, will continue to follow  If discussed at Long Length of Stay Meetings, dates discussed:    Additional Comments:  Lanier ClamMahabir, Cynai Skeens, RN 11/11/2017, 10:59 AM
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
## 2017-11-12 ENCOUNTER — Inpatient Hospital Stay (HOSPITAL_COMMUNITY): Payer: Medicaid Other

## 2017-11-12 DIAGNOSIS — Z7189 Other specified counseling: Secondary | ICD-10-CM

## 2017-11-12 DIAGNOSIS — G9340 Encephalopathy, unspecified: Secondary | ICD-10-CM

## 2017-11-12 LAB — CBC
HCT: 30.6 % — ABNORMAL LOW (ref 36.0–46.0)
HEMOGLOBIN: 9.5 g/dL — AB (ref 12.0–15.0)
MCH: 27.9 pg (ref 26.0–34.0)
MCHC: 31 g/dL (ref 30.0–36.0)
MCV: 89.7 fL (ref 78.0–100.0)
PLATELETS: 304 10*3/uL (ref 150–400)
RBC: 3.41 MIL/uL — ABNORMAL LOW (ref 3.87–5.11)
RDW: 15.3 % (ref 11.5–15.5)
WBC: 9.5 10*3/uL (ref 4.0–10.5)

## 2017-11-12 LAB — BASIC METABOLIC PANEL
ANION GAP: 9 (ref 5–15)
BUN: 21 mg/dL — ABNORMAL HIGH (ref 6–20)
CO2: 29 mmol/L (ref 22–32)
Calcium: 9.8 mg/dL (ref 8.9–10.3)
Chloride: 99 mmol/L — ABNORMAL LOW (ref 101–111)
Creatinine, Ser: 0.75 mg/dL (ref 0.44–1.00)
GFR calc Af Amer: >60 mL/min (ref >60)
GLUCOSE: 149 mg/dL — AB (ref 65–99)
POTASSIUM: 3.6 mmol/L (ref 3.5–5.1)
Sodium: 137 mmol/L (ref 135–145)

## 2017-11-12 LAB — GLUCOSE, CAPILLARY
GLUCOSE-CAPILLARY: 119 mg/dL — AB (ref 65–99)
Glucose-Capillary: 148 mg/dL — ABNORMAL HIGH (ref 65–99)
Glucose-Capillary: 122 mg/dL — ABNORMAL HIGH (ref 65–99)
Glucose-Capillary: 101 mg/dL — ABNORMAL HIGH (ref 65–99)

## 2017-11-12 LAB — PROTIME-INR
INR: 1.01
Prothrombin Time: 13.2 seconds (ref 11.4–15.2)

## 2017-11-12 LAB — MAGNESIUM: MAGNESIUM: 1.8 mg/dL (ref 1.7–2.4)

## 2017-11-12 LAB — PHOSPHORUS: Phosphorus: 3 mg/dL (ref 2.5–4.6)

## 2017-11-12 MED ORDER — PRO-STAT SUGAR FREE PO LIQD
30.0000 mL | Freq: Two times a day (BID) | ORAL | Status: DC
  Administered 2017-11-12 – 2017-11-18 (×13): 30 mL
  Filled 2017-11-12 (×14): qty 30

## 2017-11-12 MED ORDER — PROPOFOL 500 MG/50ML IV EMUL
5.0000 ug/kg/min | Freq: Once | INTRAVENOUS | Status: AC
  Administered 2017-11-12: 20 ug/kg/min via INTRAVENOUS
  Filled 2017-11-12: qty 50

## 2017-11-12 MED ORDER — MAGNESIUM SULFATE 2 GM/50ML IV SOLN
2.0000 g | Freq: Once | INTRAVENOUS | Status: AC
Start: 2017-11-12 — End: 2017-11-12
  Administered 2017-11-12: 2 g via INTRAVENOUS
  Filled 2017-11-12: qty 50

## 2017-11-12 MED ORDER — FUROSEMIDE 10 MG/ML IJ SOLN
20.0000 mg | Freq: Four times a day (QID) | INTRAMUSCULAR | Status: AC
  Administered 2017-11-12 (×3): 20 mg via INTRAVENOUS
  Filled 2017-11-12 (×3): qty 2

## 2017-11-12 MED ORDER — "THROMBI-PAD 3""X3"" EX PADS"
1.0000 | MEDICATED_PAD | Freq: Once | CUTANEOUS | Status: AC
  Administered 2017-11-12: 1 via TOPICAL
  Filled 2017-11-12: qty 1

## 2017-11-12 MED ORDER — FUROSEMIDE 10 MG/ML IJ SOLN: 40.0000 mg | Freq: Once | INTRAMUSCULAR | Status: DC

## 2017-11-12 MED ORDER — POTASSIUM CHLORIDE 20 MEQ/15ML (10%) PO SOLN
40.0000 meq | Freq: Three times a day (TID) | ORAL | Status: AC
  Administered 2017-11-12 (×2): 40 meq
  Filled 2017-11-12 (×2): qty 30

## 2017-11-12 MED ORDER — FENTANYL CITRATE (PF) 100 MCG/2ML IJ SOLN
200.0000 ug | Freq: Once | INTRAMUSCULAR | Status: AC
  Administered 2017-11-12: 100 ug via INTRAVENOUS
  Filled 2017-11-12: qty 4

## 2017-11-12 MED ORDER — VECURONIUM BROMIDE 10 MG IV SOLR
10.0000 mg | Freq: Once | INTRAVENOUS | Status: AC
Start: 2017-11-12 — End: 2017-11-12
  Administered 2017-11-12: 10 mg via INTRAVENOUS
  Filled 2017-11-12: qty 10

## 2017-11-12 MED ORDER — ETOMIDATE 2 MG/ML IV SOLN
40.0000 mg | Freq: Once | INTRAVENOUS | Status: AC
  Administered 2017-11-12: 20 mg via INTRAVENOUS
  Filled 2017-11-12 (×2): qty 20

## 2017-11-12 MED ORDER — POTASSIUM CHLORIDE 20 MEQ/15ML (10%) PO SOLN: 40.0000 meq | Freq: Once | ORAL | Status: AC

## 2017-11-12 MED ORDER — AMLODIPINE BESYLATE 5 MG PO TABS: 5.0000 mg | ORAL_TABLET | Freq: Every day | ORAL | Status: DC

## 2017-11-12 MED ORDER — FENTANYL CITRATE (PF) 100 MCG/2ML IJ SOLN
100.0000 ug | INTRAMUSCULAR | Status: DC | PRN
  Administered 2017-11-12: 100 ug via INTRAVENOUS
  Filled 2017-11-12: qty 2

## 2017-11-12 MED ORDER — FENTANYL CITRATE (PF) 100 MCG/2ML IJ SOLN
100.0000 ug | INTRAMUSCULAR | Status: DC | PRN
  Administered 2017-11-15: 100 ug via INTRAVENOUS
  Filled 2017-11-12: qty 2

## 2017-11-12 MED ORDER — MIDAZOLAM HCL 2 MG/2ML IJ SOLN
4.0000 mg | Freq: Once | INTRAMUSCULAR | Status: AC
  Administered 2017-11-12: 2 mg via INTRAVENOUS
  Filled 2017-11-12: qty 4

## 2017-11-12 MED ORDER — AMLODIPINE BESYLATE 10 MG PO TABS
10.0000 mg | ORAL_TABLET | Freq: Every day | ORAL | Status: DC
  Administered 2017-11-12 – 2017-11-15 (×4): 10 mg
  Filled 2017-11-12 (×4): qty 1

## 2017-11-12 NOTE — Progress Notes (Signed)
#   20 g IV inserted to R inner forearm using ultrasound guidance for placement.  1 stick, good blood return, flushes easily.  Sterile technique utilized.  IV site secured with tape, tegaderm.     Canary BrimBrandi Willia Lampert, NP-C [facility] Pgr: [phone] or if no answer [phone] 11/12/2017, 1:02 PM

## 2017-11-12 NOTE — Progress Notes (Signed)
Nutrition Follow-up  DOCUMENTATION CODES:   Obesity unspecified  INTERVENTION:  - Following trach, continue Vital High Protein @ 45 mL/hr and will increase Prostat to 30 mL BID. This updated regimen will provide 1280 (103% estimated kcal need), 114 grams of protein, and 903 mL free water.  - Will d/c free water flush d/t weight trending up and need for IV Lasix. - If free water flush desired, new order to be per MD/NP.   NUTRITION DIAGNOSIS:   Inadequate oral intake related to inability to eat as evidenced by NPO status. -ongoing  GOAL:   Provide needs based on ASPEN/SCCM guidelines -previously met with TF regimen; unmet with TF currently on hold.   MONITOR:   Vent status, TF tolerance, Weight trends, Labs  ASSESSMENT:   62 yo female admitted from SNF on 3/24 with abdominal pain secondary to sepsis, AMS. PMH HTN, stroke, DM  Weight trending up and is now +11 lbs/5.1 kg compared to weight on 3/25. Estimated nutrition needs remain appropriate at this time. Pt intubated with OGT in place. Order in place for Vital High Protein @ 45 mL/hr with 30 mL Prostat once/day and 100 mL free water QID. This regimen provides 1180 kcal, 109 grams of protein, and 1303 kcal.   TF currently turned off and RN, who is at bedside, reports this is d/t plan for tracheostomy today.   Patient is currently intubated on ventilator support MV: 7.6 L/min Temp (24hrs), Avg:100.5 F (38.1 C), Min:97.9 F (36.6 C), Max:101.5 F (38.6 C) Propofol: none BP: 185/65 and MAP: 113  Medications reviewed; 20 mg IV Lasix QID, sliding scale Novolog, 2 g IV Mg sulfate x1 run today, 40 mEq KCl per OGT x1 dose today and x2 doses yesterday. Labs reviewed; CBG: 119 mg/dL this AM, Cl: 99 mmol/L, BUN: 21 mg/dL.   Drip: Precedex @ 0.8 mcg/kg/hr.      Diet Order:  Diet NPO time specified Except for: Sips with Meds Diet NPO time specified  EDUCATION NEEDS:   Not appropriate for education at this time  Skin:  Other:  excoriated marks BL arms with ecchymosis  Last BM:  3/27  Height:   Ht Readings from Last 1 Encounters:  11/11/17 5' 4"  (1.626 m)    Weight:   Wt Readings from Last 1 Encounters:  11/12/17 211 lb 6.7 oz (95.9 kg)    Ideal Body Weight:  54.5 kg  BMI:  Body mass index is 36.29 kg/m.  Estimated Nutritional Needs:   Kcal:  [phone] (11-14 kcal/kg actual weight)  Protein:  >= 109 grams (2 grams/kg IBW)  Fluid:  >=1.8 L/day      Jarome Matin, MS, RD, LDN, [facility] Inpatient Clinical Dietitian Pager # [phone] After hours/weekend pager # [phone]

## 2017-11-12 NOTE — Procedures (Signed)
Percutaneous Tracheostomy Placement  Consent from family.  Patient sedated, paralyzed and position.  Placed on 100% FiO2 and RR matched.  Area cleaned and draped.  Lidocaine/epi injected.  Skin incision done followed by blunt dissection.  Trachea palpated then punctured, catheter passed and visualized bronchoscopically.  Wire placed and visualized.  Catheter removed.  Airway then entered and dilated.  Size 6 cuffed shiley trach placed and visualized bronchoscopically well above carina.  Good volume returns.  Patient tolerated the procedure well without complications.  Minimal blood loss.  CXR ordered and pending.  Omara Alcon G. Tanis Hensarling, M.D. [facility] Pulmonary/Critical Care Medicine. Pager: [phone]. After hours pager: [phone].  

## 2017-11-12 NOTE — Procedures (Addendum)
Bronchoscopy Procedure Note Grace [name] [id number] 1955-10-17  Procedure: Bronchoscopy Indications: Tracheostomy Placement   Procedure Details Consent: Risks of procedure as well as the alternatives and risks of each were explained to the (patient/caregiver).  Consent for procedure obtained.   Time Out: Verified patient identification, verified procedure, site/side was marked, verified correct patient position, special equipment/implants available, medications/allergies/relevent history reviewed, required imaging and test results available.  Performed  In preparation for procedure, patient was given 100% FiO2 and bronchoscope lubricated. Sedation: Fentanyl, versed, etomidate, vecuronium. Propofol.   Airway entered and the upper airway was examined to the level of the carina.  No lesions noted, ETT with clear secretions.  Procedures performed: tracheostomy placement, #6 Shiley cuffed  Evaluation Hemodynamic Status: BP stable throughout; O2 sats: stable throughout Patient's Current Condition: stable Specimens:  None Complications: No apparent complications Patient tolerated the procedure well.  Procedure performed under direct supervision of Dr. Molli KnockYacoub for direct visualization of tracheostomy placement.    Grace BrimBrandi Ollis, NP-C [place] Pgr: [phone] or if no answer [phone] 11/12/2017, 1:00 PM  Grace ReedyWesam G. [name], M.D. [facility]monary/Critical Care Medicine. Pager: [phone]. After hours pager: [phone].

## 2017-11-12 NOTE — Progress Notes (Signed)
140 cc fentanyl wasted in sink. Adolph PollackElizabeth Mathai, RN witness to waste.
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
## 2017-11-13 ENCOUNTER — Inpatient Hospital Stay (HOSPITAL_COMMUNITY): Payer: Medicaid Other

## 2017-11-13 DIAGNOSIS — G9340 Encephalopathy, unspecified: Secondary | ICD-10-CM

## 2017-11-13 LAB — GLUCOSE, CAPILLARY
GLUCOSE-CAPILLARY: 171 mg/dL — AB (ref 65–99)
GLUCOSE-CAPILLARY: 183 mg/dL — AB (ref 65–99)
GLUCOSE-CAPILLARY: 193 mg/dL — AB (ref 65–99)
Glucose-Capillary: 137 mg/dL — ABNORMAL HIGH (ref 65–99)
Glucose-Capillary: 156 mg/dL — ABNORMAL HIGH (ref 65–99)
Glucose-Capillary: 154 mg/dL — ABNORMAL HIGH (ref 65–99)
Glucose-Capillary: 191 mg/dL — ABNORMAL HIGH (ref 65–99)
Glucose-Capillary: 197 mg/dL — ABNORMAL HIGH (ref 65–99)

## 2017-11-13 LAB — BLOOD GAS, ARTERIAL
ACID-BASE EXCESS: 11.5 mmol/L — AB (ref 0.0–2.0)
BICARBONATE: 35.9 mmol/L — AB (ref 20.0–28.0)
Drawn by: 308601
FIO2: 40
LHR: 16 {breaths}/min
MECHVT: 440 mL
O2 SAT: 96.5 %
PEEP/CPAP: 5 cmH2O
PH ART: 7.504 — AB (ref 7.350–7.450)
Patient temperature: 36.5
pCO2 arterial: 45.8 mmHg (ref 32.0–48.0)
pO2, Arterial: 84.8 mmHg (ref 83.0–108.0)

## 2017-11-13 LAB — BASIC METABOLIC PANEL
Anion gap: 11 (ref 5–15)
BUN: 19 mg/dL (ref 6–20)
CHLORIDE: 94 mmol/L — AB (ref 101–111)
CO2: 33 mmol/L — ABNORMAL HIGH (ref 22–32)
CREATININE: 0.72 mg/dL (ref 0.44–1.00)
Calcium: 9.8 mg/dL (ref 8.9–10.3)
GFR calc non Af Amer: >60 mL/min (ref >60)
Glucose, Bld: 143 mg/dL — ABNORMAL HIGH (ref 65–99)
POTASSIUM: 3.8 mmol/L (ref 3.5–5.1)
SODIUM: 138 mmol/L (ref 135–145)

## 2017-11-13 LAB — CBC
HEMATOCRIT: 31.7 % — AB (ref 36.0–46.0)
HEMOGLOBIN: 10 g/dL — AB (ref 12.0–15.0)
MCH: 28.1 pg (ref 26.0–34.0)
MCHC: 31.5 g/dL (ref 30.0–36.0)
MCV: 89 fL (ref 78.0–100.0)
Platelets: 307 10*3/uL (ref 150–400)
RBC: 3.56 MIL/uL — ABNORMAL LOW (ref 3.87–5.11)
RDW: 15 % (ref 11.5–15.5)
WBC: 6.4 10*3/uL (ref 4.0–10.5)

## 2017-11-13 LAB — CULTURE, BLOOD (ROUTINE X 2)
CULTURE: NO GROWTH
Culture: NO GROWTH
SPECIAL REQUESTS: ADEQUATE
Special Requests: ADEQUATE

## 2017-11-13 LAB — PHOSPHORUS: PHOSPHORUS: 2.4 mg/dL — AB (ref 2.5–4.6)

## 2017-11-13 LAB — MAGNESIUM: MAGNESIUM: 1.8 mg/dL (ref 1.7–2.4)

## 2017-11-13 MED ORDER — POTASSIUM PHOSPHATES 15 MMOLE/5ML IV SOLN
30.0000 mmol | Freq: Once | INTRAVENOUS | Status: AC
  Administered 2017-11-13: 30 mmol via INTRAVENOUS
  Filled 2017-11-13: qty 10

## 2017-11-13 MED ORDER — HYDRALAZINE HCL 25 MG PO TABS
25.0000 mg | ORAL_TABLET | Freq: Four times a day (QID) | ORAL | Status: DC
  Administered 2017-11-13 – 2017-11-14 (×4): 25 mg via ORAL
  Filled 2017-11-13 (×4): qty 1

## 2017-11-13 MED ORDER — FUROSEMIDE 10 MG/ML IJ SOLN
40.0000 mg | Freq: Three times a day (TID) | INTRAMUSCULAR | Status: AC
  Administered 2017-11-13 (×2): 40 mg via INTRAVENOUS
  Filled 2017-11-13 (×2): qty 4

## 2017-11-13 MED ORDER — MAGNESIUM SULFATE 2 GM/50ML IV SOLN
2.0000 g | Freq: Once | INTRAVENOUS | Status: AC
  Administered 2017-11-13: 2 g via INTRAVENOUS
  Filled 2017-11-13: qty 50

## 2017-11-13 MED ORDER — LABETALOL HCL 5 MG/ML IV SOLN
0.5000 mg/min | INTRAVENOUS | Status: DC
  Administered 2017-11-13: 0.5 mg/min via INTRAVENOUS
  Administered 2017-11-14 – 2017-11-15 (×2): 1.5 mg/min via INTRAVENOUS
  Filled 2017-11-13: qty 20
  Filled 2017-11-13: qty 80
  Filled 2017-11-13 (×2): qty 100
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
## 2017-11-13 NOTE — Progress Notes (Signed)
Pt has been HTN in systolic 200s, has been receiving scheduled metoprolol 25mg , PO hydralazine, 10mg  norvasc   1400 BP 210/66 PRN 10mg  IV hydralazine given  1421 Informed MD, orders given to start pt on labetalol

## 2017-11-13 NOTE — Progress Notes (Signed)
eLink Physician-Brief Progress Note Patient Name: Grace [name] DOB: [birth year] MRN: [id number]   Date of Service  11/13/2017  HPI/Events of Note  Request to renew orders for bilateral soft wrist restraints.   eICU Interventions  Will reorder soft bilateral wrist restraints.      Intervention Category Minor Interventions: Agitation / anxiety - evaluation and management  Sommer,Steven Eugene 11/13/2017, 8:07 PM

## 2017-11-14 ENCOUNTER — Inpatient Hospital Stay (HOSPITAL_COMMUNITY): Payer: Medicaid Other

## 2017-11-14 LAB — GLUCOSE, CAPILLARY
GLUCOSE-CAPILLARY: 142 mg/dL — AB (ref 65–99)
GLUCOSE-CAPILLARY: 156 mg/dL — AB (ref 65–99)
GLUCOSE-CAPILLARY: 135 mg/dL — AB (ref 65–99)
GLUCOSE-CAPILLARY: 205 mg/dL — AB (ref 65–99)
Glucose-Capillary: 127 mg/dL — ABNORMAL HIGH (ref 65–99)
Glucose-Capillary: 199 mg/dL — ABNORMAL HIGH (ref 65–99)

## 2017-11-14 LAB — CBC
HCT: 27.5 % — ABNORMAL LOW (ref 36.0–46.0)
HEMOGLOBIN: 8.7 g/dL — AB (ref 12.0–15.0)
MCH: 27.9 pg (ref 26.0–34.0)
MCHC: 31.6 g/dL (ref 30.0–36.0)
MCV: 88.1 fL (ref 78.0–100.0)
Platelets: 307 10*3/uL (ref 150–400)
RBC: 3.12 MIL/uL — AB (ref 3.87–5.11)
RDW: 15.1 % (ref 11.5–15.5)
WBC: 9.2 10*3/uL (ref 4.0–10.5)

## 2017-11-14 LAB — BASIC METABOLIC PANEL
ANION GAP: 10 (ref 5–15)
BUN: 25 mg/dL — ABNORMAL HIGH (ref 6–20)
CO2: 32 mmol/L (ref 22–32)
Calcium: 9.2 mg/dL (ref 8.9–10.3)
Chloride: 92 mmol/L — ABNORMAL LOW (ref 101–111)
Creatinine, Ser: 0.76 mg/dL (ref 0.44–1.00)
GFR calc Af Amer: >60 mL/min (ref >60)
GFR calc non Af Amer: >60 mL/min (ref >60)
GLUCOSE: 155 mg/dL — AB (ref 65–99)
POTASSIUM: 3.7 mmol/L (ref 3.5–5.1)
Sodium: 134 mmol/L — ABNORMAL LOW (ref 135–145)

## 2017-11-14 LAB — PHOSPHORUS: Phosphorus: 3.5 mg/dL (ref 2.5–4.6)

## 2017-11-14 LAB — MAGNESIUM: Magnesium: 2 mg/dL (ref 1.7–2.4)

## 2017-11-14 MED ORDER — POTASSIUM CHLORIDE 20 MEQ/15ML (10%) PO SOLN
40.0000 meq | Freq: Three times a day (TID) | ORAL | Status: AC
  Administered 2017-11-14: 40 meq
  Filled 2017-11-14 (×2): qty 30

## 2017-11-14 MED ORDER — FUROSEMIDE 10 MG/ML IJ SOLN
20.0000 mg | Freq: Four times a day (QID) | INTRAMUSCULAR | Status: AC
  Administered 2017-11-14 (×3): 20 mg via INTRAVENOUS
  Filled 2017-11-14 (×3): qty 2

## 2017-11-14 MED ORDER — HYDRALAZINE HCL 50 MG PO TABS
50.0000 mg | ORAL_TABLET | Freq: Four times a day (QID) | ORAL | Status: DC
  Administered 2017-11-14 – 2017-11-15 (×4): 50 mg via ORAL
  Filled 2017-11-14 (×4): qty 1

## 2017-11-14 NOTE — Progress Notes (Signed)
Patient off Precedex drip since 8 am this morning. Awake and alert, but agitated at time. Still has restraints on (attempts to pull out NG tube) Patient off the vent and  has been switched to T-Bar/T-collar. Will continue to monitor.

## 2017-11-14 NOTE — Progress Notes (Signed)
eLink Physician-Brief Progress Note Patient Name: Jacalyn LefevreGwendolyn Madej DOB: 1955/12/23 MRN: [id number]   Date of Service  11/14/2017  HPI/Events of Note  Agitation - Request to renew soft R wrist restraint.  eICU Interventions  Will renew soft R wrist restraint.      Intervention Category Minor Interventions: Agitation / anxiety - evaluation and management  Lenell AntuSommer,Tayvian [place] Eugene 11/14/2017, 11:49 PM
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
## 2017-11-14 NOTE — Progress Notes (Signed)
Pt on Tbar and tolerating well at this time, pt in no distress.  RT to monitor and assess throughout the night.

## 2017-11-14 NOTE — Progress Notes (Signed)
Patient pulled off her NG tube   (even with restraints on). Notified MD. Orders to place NG tube given.

## 2017-11-15 ENCOUNTER — Inpatient Hospital Stay (HOSPITAL_COMMUNITY): Payer: Medicaid Other

## 2017-11-15 LAB — PROCALCITONIN: PROCALCITONIN: 0.16 ng/mL

## 2017-11-15 LAB — CBC
HCT: 33.4 % — ABNORMAL LOW (ref 36.0–46.0)
Hemoglobin: 10.4 g/dL — ABNORMAL LOW (ref 12.0–15.0)
MCH: 28.1 pg (ref 26.0–34.0)
MCHC: 31.1 g/dL (ref 30.0–36.0)
MCV: 90.3 fL (ref 78.0–100.0)
PLATELETS: 408 10*3/uL — AB (ref 150–400)
RBC: 3.7 MIL/uL — AB (ref 3.87–5.11)
RDW: 15.4 % (ref 11.5–15.5)
WBC: 11.2 10*3/uL — AB (ref 4.0–10.5)

## 2017-11-15 LAB — BLOOD GAS, ARTERIAL
ACID-BASE EXCESS: 10.5 mmol/L — AB (ref 0.0–2.0)
BICARBONATE: 36.2 mmol/L — AB (ref 20.0–28.0)
DRAWN BY: 103701
FIO2: 40
MECHVT: 440 mL
O2 Saturation: 97.4 %
PEEP/CPAP: 5 cmH2O
Patient temperature: 36.8
RATE: 16 resp/min
pCO2 arterial: 55.4 mmHg — ABNORMAL HIGH (ref 32.0–48.0)
pH, Arterial: 7.429 (ref 7.350–7.450)
pO2, Arterial: 106 mmHg (ref 83.0–108.0)

## 2017-11-15 LAB — BASIC METABOLIC PANEL
ANION GAP: 11 (ref 5–15)
BUN: 26 mg/dL — ABNORMAL HIGH (ref 6–20)
CO2: 33 mmol/L — ABNORMAL HIGH (ref 22–32)
Calcium: 9.5 mg/dL (ref 8.9–10.3)
Chloride: 91 mmol/L — ABNORMAL LOW (ref 101–111)
Creatinine, Ser: 0.8 mg/dL (ref 0.44–1.00)
GFR calc Af Amer: >60 mL/min (ref >60)
Glucose, Bld: 222 mg/dL — ABNORMAL HIGH (ref 65–99)
POTASSIUM: 4.7 mmol/L (ref 3.5–5.1)
SODIUM: 135 mmol/L (ref 135–145)

## 2017-11-15 LAB — GLUCOSE, CAPILLARY
GLUCOSE-CAPILLARY: 211 mg/dL — AB (ref 65–99)
GLUCOSE-CAPILLARY: 189 mg/dL — AB (ref 65–99)
GLUCOSE-CAPILLARY: 146 mg/dL — AB (ref 65–99)
Glucose-Capillary: 205 mg/dL — ABNORMAL HIGH (ref 65–99)
Glucose-Capillary: 123 mg/dL — ABNORMAL HIGH (ref 65–99)

## 2017-11-15 LAB — PHOSPHORUS: Phosphorus: 2.9 mg/dL (ref 2.5–4.6)

## 2017-11-15 LAB — MAGNESIUM: MAGNESIUM: 1.9 mg/dL (ref 1.7–2.4)

## 2017-11-15 MED ORDER — FENTANYL CITRATE (PF) 100 MCG/2ML IJ SOLN: 25.0000 ug | INTRAMUSCULAR | Status: DC | PRN

## 2017-11-15 MED ORDER — HYDRALAZINE HCL 50 MG PO TABS
100.0000 mg | ORAL_TABLET | Freq: Three times a day (TID) | ORAL | Status: DC
  Administered 2017-11-15 – 2017-11-30 (×39): 100 mg via ORAL
  Filled 2017-11-15 (×41): qty 2

## 2017-11-15 MED ORDER — LABETALOL HCL 100 MG PO TABS
100.0000 mg | ORAL_TABLET | Freq: Three times a day (TID) | ORAL | Status: DC
  Administered 2017-11-15 (×3): 100 mg
  Filled 2017-11-15 (×3): qty 1

## 2017-11-15 MED ORDER — FLUCONAZOLE 40 MG/ML PO SUSR
200.0000 mg | Freq: Every day | ORAL | Status: AC
  Administered 2017-11-15 – 2017-11-17 (×3): 200 mg
  Filled 2017-11-15 (×3): qty 5
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
## 2017-11-15 NOTE — Progress Notes (Signed)
eLink Physician-Brief Progress Note Patient Name: Grace [name] DOB: Aug 22, 1955 MRN: [id number]   Date of Service  11/15/2017  HPI/Events of Note  Multiple issues: 1. Agitation - Request to renew bilateral soft wrist restraints and 2. Foul, cheesy vaginal discharge c/w vaginal candidiasis.   eICU Interventions  Will order: 1. Renew bilateral soft wrist restraints.  2. Diflucan 200 mg per tube now and Q day X 2 (total of 3 doses).     Intervention Category Major Interventions: Infection - evaluation and management Minor Interventions: Agitation / anxiety - evaluation and management  Sommer,Steven Eugene 11/15/2017, 10:43 PM

## 2017-11-15 NOTE — Progress Notes (Signed)
Nutrition Follow-up  DOCUMENTATION CODES:   Obesity unspecified  INTERVENTION:  - Continue Vital High Protein @ 45 mL/hr with 30 mL Prostat BID. - Free water flush to continue to be per MD/NP order.   NUTRITION DIAGNOSIS:   Inadequate oral intake related to inability to eat as evidenced by NPO status. -ongoing  GOAL:   Provide needs based on ASPEN/SCCM guidelines -met with TF regimen  MONITOR:   Vent status, TF tolerance, Weight trends, Labs  ASSESSMENT:   62 yo female admitted from SNF on 3/24 with abdominal pain secondary to sepsis, AMS. PMH HTN, stroke, DM  Significant Events: 3/24: intubated and OGT placed 3/26: self-extubated; re-intubated and OGT replaced 3/29 trach, OGT removed, and NGT placed 3/31: switched from vent via trach to trach collar; pt pulled NGT; NGT replaced 4/1: placed back on vent via trach d/t respiratory distress   Weight -6 lbs/2.7 kg compared to admission weight. Estimated nutrition needs remain appropriate. Tracheostomy on 3/29 and pt now on vent via trach collar. She has NGT and is receiving Vital High Protein @ 45 mL/hr with 30 mL Prostat BID and 30 mL free water every 4 hours. This regimen is providing 1280 kcal (103% estimated kcal need), 124 grams of protein, and 1083 mL free water.   Per Pete's, PCCM NP, note this AM: plan for full vent support today, "suspect she will need nocturnal vent for some time given degree of deconditioning," contraction alkalosis, leukocytosis, E. Coli UTI.  Patient is currently intubated on ventilator support MV: 7.5 L/min Temp (24hrs), Avg:99.3 F (37.4 C), Min:97.3 F (36.3 C), Max:101.7 F (38.7 C) BP: 199/64 and MAP: 106  Medications reviewed; 20 mg IV Lasix x3 doses yesterday, sliding scale Novolog, 40 mg Protonix per NGT/day.  Labs reviewed; CBGs: 211 and 205 mg/dL today, Cl: 91 mmol/L, BUN: 56 mg/dL.     Diet Order:  Diet NPO time specified  EDUCATION NEEDS:   Not appropriate for education at  this time  Skin:  Other: excoriated marks BL arms with ecchymosis  Last BM:  4/1  Height:   Ht Readings from Last 1 Encounters:  11/11/17 _0  (1.626 m)    Weight:   Wt Readings from Last 1 Encounters:  11/15/17 194 lb 3.6 oz (88.1 kg)    Ideal Body Weight:  54.5 kg  BMI:  Body mass index is 33.34 kg/m.  Estimated Nutritional Needs:   Kcal:  [phone] (11-14 kcal/kg actual weight)  Protein:  >= 109 grams (2 grams/kg IBW)  Fluid:  >=1.8 L/day       Jarome Matin, MS, RD, LDN, [facility] Inpatient Clinical Dietitian Pager # [phone] After hours/weekend pager # [phone]

## 2017-11-15 NOTE — Progress Notes (Signed)
SBP 200's notified Dr. Arsenio LoaderSommer, restarted labetalol drip per previous order.

## 2017-11-16 ENCOUNTER — Inpatient Hospital Stay (HOSPITAL_COMMUNITY): Payer: Medicaid Other

## 2017-11-16 LAB — COMPREHENSIVE METABOLIC PANEL
ALT: 14 U/L (ref 14–54)
AST: 19 U/L (ref 15–41)
Albumin: 3.1 g/dL — ABNORMAL LOW (ref 3.5–5.0)
Alkaline Phosphatase: 55 U/L (ref 38–126)
Anion gap: 10 (ref 5–15)
BUN: 26 mg/dL — AB (ref 6–20)
CHLORIDE: 95 mmol/L — AB (ref 101–111)
CO2: 33 mmol/L — AB (ref 22–32)
CREATININE: 0.76 mg/dL (ref 0.44–1.00)
Calcium: 10 mg/dL (ref 8.9–10.3)
GFR calc Af Amer: >60 mL/min (ref >60)
GLUCOSE: 197 mg/dL — AB (ref 65–99)
Potassium: 4.3 mmol/L (ref 3.5–5.1)
SODIUM: 138 mmol/L (ref 135–145)
Total Bilirubin: 0.5 mg/dL (ref 0.3–1.2)
Total Protein: 7.8 g/dL (ref 6.5–8.1)

## 2017-11-16 LAB — CBC
HCT: 33.3 % — ABNORMAL LOW (ref 36.0–46.0)
Hemoglobin: 10.3 g/dL — ABNORMAL LOW (ref 12.0–15.0)
MCH: 27.8 pg (ref 26.0–34.0)
MCHC: 30.9 g/dL (ref 30.0–36.0)
MCV: 90 fL (ref 78.0–100.0)
PLATELETS: 462 10*3/uL — AB (ref 150–400)
RBC: 3.7 MIL/uL — AB (ref 3.87–5.11)
RDW: 15.1 % (ref 11.5–15.5)
WBC: 10.9 10*3/uL — AB (ref 4.0–10.5)

## 2017-11-16 LAB — GLUCOSE, CAPILLARY
GLUCOSE-CAPILLARY: 191 mg/dL — AB (ref 65–99)
GLUCOSE-CAPILLARY: 186 mg/dL — AB (ref 65–99)
GLUCOSE-CAPILLARY: 157 mg/dL — AB (ref 65–99)
GLUCOSE-CAPILLARY: 170 mg/dL — AB (ref 65–99)
Glucose-Capillary: 133 mg/dL — ABNORMAL HIGH (ref 65–99)
Glucose-Capillary: 143 mg/dL — ABNORMAL HIGH (ref 65–99)
Glucose-Capillary: 144 mg/dL — ABNORMAL HIGH (ref 65–99)

## 2017-11-16 LAB — LIPASE, BLOOD: Lipase: 21 U/L (ref 11–51)

## 2017-11-16 LAB — PROCALCITONIN: Procalcitonin: 0.17 ng/mL

## 2017-11-16 MED ORDER — LABETALOL HCL 200 MG PO TABS
200.0000 mg | ORAL_TABLET | Freq: Three times a day (TID) | ORAL | Status: DC
  Administered 2017-11-16 – 2017-11-26 (×29): 200 mg
  Filled 2017-11-16 (×32): qty 1

## 2017-11-16 MED ORDER — NICARDIPINE HCL IN NACL 20-0.86 MG/200ML-% IV SOLN
3.0000 mg/h | INTRAVENOUS | Status: DC
  Administered 2017-11-16: 5 mg/h via INTRAVENOUS
  Filled 2017-11-16 (×3): qty 200

## 2017-11-16 MED ORDER — METOCLOPRAMIDE HCL 5 MG/ML IJ SOLN
5.0000 mg | Freq: Three times a day (TID) | INTRAMUSCULAR | Status: DC
  Administered 2017-11-16 – 2017-11-29 (×38): 5 mg via INTRAVENOUS
  Filled 2017-11-16 (×40): qty 2

## 2017-11-16 MED ORDER — LABETALOL HCL 5 MG/ML IV SOLN
10.0000 mg | INTRAVENOUS | Status: DC | PRN
  Administered 2017-11-16 (×3): 10 mg via INTRAVENOUS
  Filled 2017-11-16 (×3): qty 4

## 2017-11-16 MED ORDER — VITAL HIGH PROTEIN PO LIQD: 1000.0000 mL | ORAL | Status: DC

## 2017-11-16 MED ORDER — POLYETHYLENE GLYCOL 3350 17 G PO PACK
17.0000 g | PACK | Freq: Every day | ORAL | Status: DC
  Administered 2017-11-16 – 2017-11-28 (×10): 17 g via ORAL
  Filled 2017-11-16 (×11): qty 1

## 2017-11-16 MED ORDER — ONDANSETRON HCL 4 MG/2ML IJ SOLN: 4.0000 mg | Freq: Four times a day (QID) | INTRAMUSCULAR | Status: DC | PRN

## 2017-11-16 MED ORDER — ONDANSETRON HCL 4 MG PO TABS: 4.0000 mg | ORAL_TABLET | Freq: Four times a day (QID) | ORAL | Status: DC | PRN

## 2017-11-16 MED ORDER — LORAZEPAM BOLUS VIA INFUSION: 1.0000 mg | INTRAVENOUS | Status: DC | PRN

## 2017-11-16 MED ORDER — CLONIDINE HCL 0.1 MG PO TABS
0.1000 mg | ORAL_TABLET | Freq: Three times a day (TID) | ORAL | Status: DC
  Administered 2017-11-16 – 2017-11-17 (×4): 0.1 mg
  Filled 2017-11-16 (×5): qty 1

## 2017-11-16 MED ORDER — SODIUM CHLORIDE 0.9 % IV SOLN
INTRAVENOUS | Status: DC | PRN
  Administered 2017-11-18: 10 mL/h via INTRAVENOUS
  Administered 2017-11-22 – 2017-11-28 (×2): via INTRAVENOUS

## 2017-11-16 MED ORDER — VITAL HIGH PROTEIN PO LIQD
1000.0000 mL | ORAL | Status: DC
  Administered 2017-11-16 – 2017-11-18 (×4): 1000 mL
  Filled 2017-11-16 (×5): qty 1000

## 2017-11-16 MED ORDER — LORAZEPAM 2 MG/ML IJ SOLN
1.0000 mg | INTRAMUSCULAR | Status: DC | PRN
  Administered 2017-11-18 – 2017-11-29 (×15): 1 mg via INTRAVENOUS
  Filled 2017-11-16 (×17): qty 1
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
## 2017-11-16 NOTE — Progress Notes (Signed)
Nutrition Follow-up  DOCUMENTATION CODES:   Obesity unspecified  INTERVENTION:  - Will decrease TF regimen to Vital High Protein @ 20 mL/hr with 30 mL Prostat BID. This regimen will provide 680 kcal (69% estimated kcal need), 72 grams of protein (66% estimated protein need), and 401 mL free water.  - Will monitor for tolerance of TF regimen with the addition of Reglan and will adjust as needed/increase back to goal rate as able.   NUTRITION DIAGNOSIS:   Inadequate oral intake related to inability to eat as evidenced by NPO status. -ongoing  GOAL:   Provide needs based on ASPEN/SCCM guidelines -unmet at this time with TF on hold.   MONITOR:   Vent status, TF tolerance, Weight trends, Labs  ASSESSMENT:   62 yo female admitted from SNF on 3/24 with abdominal pain secondary to sepsis, AMS. PMH HTN, stroke, DM  Weight stable from yesterday. Pt remains intubated via trach and has NGT in place. RN alerted RD that pt had projectile vomiting and TF was subsequently placed on hold. She reports that Reglan has been ordered and that pt has an ileus. Reviewed Pete's note from this AM which states "resume tube feeds when able."   Patient is currently intubated on ventilator support MV: 8.4 L/min Temp (24hrs), Avg:99.3 F (37.4 C), Min:98.4 F (36.9 C), Max:100.4 F (38 C) BP: 176/71 and MAP: 102  Medications reviewed; sliding scale Novolog, 5 mg IV Reglan TID, 1 packet Miralax/day. Labs reviewed; CBGs: 144, 186, and 143 mg/dL, Cl: 95 mmol/L, BUN: 26 mg/dL.   Diet Order:  Diet NPO time specified  EDUCATION NEEDS:   Not appropriate for education at this time  Skin:  Other: excoriated marks BL arms with ecchymosis  Last BM:  4/1  Height:   Ht Readings from Last 1 Encounters:  11/11/17 5\' 4"  (1.626 m)    Weight:   Wt Readings from Last 1 Encounters:  11/16/17 194 lb 0.1 oz (88 kg)    Ideal Body Weight:  54.5 kg  BMI:  Body mass index is 33.3 kg/m.  Estimated  Nutritional Needs:   Kcal:  [phone] (11-14 kcal/kg actual weight)  Protein:  >= 109 grams (2 grams/kg IBW)  Fluid:  >=1.8 L/day      Trenton GammonJessica Collier Monica, MS, RD, LDN, [facility] Inpatient Clinical Dietitian Pager # [phone] After hours/weekend pager # [phone]

## 2017-11-16 NOTE — Progress Notes (Signed)
eLink Physician-Brief Progress Note Patient Name: Grace LefevreGwendolyn [name] DOB: 11/26/1955 MRN: [id number]   Date of Service  11/16/2017  HPI/Events of Note  Multiple issues: 1. Hypertension - BP = 241/87 and 2. Urinary retention - Bladder residual = 360 mL on bladder scan.   eICU Interventions  Will order: 1. Nicardipine IV infusion. Titrate to SBP < 190. 2. D/C Amlodipine. 3. I/O Cath PRN.      Intervention Category Major Interventions: Hypertension - evaluation and management  Ekam Besson Eugene 11/16/2017, 3:23 AM

## 2017-11-16 NOTE — Progress Notes (Signed)
eLink Physician-Brief Progress Note Patient Name: Grace LefevreGwendolyn Rosengrant DOB: [birth year] MRN: [id number]   Date of Service  11/16/2017  HPI/Events of Note  Sinus Tachycardia and Hypertension - BP = 209/86. Labetalol IV infusion D/Ced today and Labetalol PO started.   eICU Interventions  Will order: 1. Labetalol 10 mg IV Q 2 hours PRN SBP > 180.      Intervention Category Major Interventions: Arrhythmia - evaluation and management;Hypertension - evaluation and management  Lenell AntuSommer,Steven Eugene 11/16/2017, 12:49 AM

## 2017-11-16 NOTE — Progress Notes (Signed)
Patient returned to full support at this time. Patient vomited and RR 54 at this time. RT will continue to monitor patient. Bedside RN with patient at this time.

## 2017-11-16 NOTE — Plan of Care (Signed)
62 year old female admitted 11/07/2017 with acute respiratory failure now status post trach.  She is a nursing home resident.  Her stay was complicated with hypertensive urgency and encephalopathy E. coli UTI which has been treated.  She also has an ileus.  P CCM will stay on board patient is waiting to be placed to LTAC.  [facility] will pick up the patient 11/17/2017.

## 2017-11-16 NOTE — Progress Notes (Signed)
Patient placed on ATC 40%. Tolerating well at this time. RT will continue to monitor patient. Vent on standby.

## 2017-11-17 ENCOUNTER — Inpatient Hospital Stay (HOSPITAL_COMMUNITY): Payer: Medicaid Other

## 2017-11-17 DIAGNOSIS — R112 Nausea with vomiting, unspecified: Secondary | ICD-10-CM

## 2017-11-17 DIAGNOSIS — A4151 Sepsis due to Escherichia coli [E. coli]: Principal | ICD-10-CM

## 2017-11-17 LAB — BASIC METABOLIC PANEL
Anion gap: 10 (ref 5–15)
BUN: 28 mg/dL — ABNORMAL HIGH (ref 6–20)
CALCIUM: 9.6 mg/dL (ref 8.9–10.3)
CO2: 33 mmol/L — ABNORMAL HIGH (ref 22–32)
CREATININE: 0.81 mg/dL (ref 0.44–1.00)
Chloride: 96 mmol/L — ABNORMAL LOW (ref 101–111)
GFR calc non Af Amer: >60 mL/min (ref >60)
Glucose, Bld: 145 mg/dL — ABNORMAL HIGH (ref 65–99)
Potassium: 3.7 mmol/L (ref 3.5–5.1)
Sodium: 139 mmol/L (ref 135–145)

## 2017-11-17 LAB — PROCALCITONIN: Procalcitonin: 0.11 ng/mL

## 2017-11-17 LAB — GLUCOSE, CAPILLARY
GLUCOSE-CAPILLARY: 133 mg/dL — AB (ref 65–99)
GLUCOSE-CAPILLARY: 72 mg/dL (ref 65–99)
GLUCOSE-CAPILLARY: 87 mg/dL (ref 65–99)
Glucose-Capillary: 108 mg/dL — ABNORMAL HIGH (ref 65–99)
Glucose-Capillary: 133 mg/dL — ABNORMAL HIGH (ref 65–99)
Glucose-Capillary: 138 mg/dL — ABNORMAL HIGH (ref 65–99)

## 2017-11-17 MED ORDER — AMLODIPINE BESYLATE 5 MG PO TABS
5.0000 mg | ORAL_TABLET | Freq: Every day | ORAL | Status: DC
  Administered 2017-11-17 – 2017-11-21 (×5): 5 mg
  Filled 2017-11-17 (×5): qty 1

## 2017-11-17 MED ORDER — CLONIDINE HCL 0.1 MG PO TABS
0.2000 mg | ORAL_TABLET | Freq: Three times a day (TID) | ORAL | Status: DC
  Administered 2017-11-17 – 2017-11-26 (×25): 0.2 mg
  Filled 2017-11-17 (×25): qty 2

## 2017-11-17 MED ORDER — SENNOSIDES-DOCUSATE SODIUM 8.6-50 MG PO TABS
1.0000 | ORAL_TABLET | Freq: Two times a day (BID) | ORAL | Status: DC
  Administered 2017-11-17 – 2017-11-26 (×17): 1
  Filled 2017-11-17 (×18): qty 1

## 2017-11-17 NOTE — Progress Notes (Signed)
PROGRESS NOTE [facility]   [place]   MAU:[id number] DOB: January 29, 1956  DOA: 11/07/2017 PCP: Earlyne Iba, MD   Brief Narrative:  [name] 62 year old female with past medical history of CVA with left hemiparesis, hypertension and CAD who presented to the emergency department with fevers and elevated blood pressure.  She was admitted on 3/24 after being found encephalopathic and septic due to UTI.  On 3/25 she had acute changes on mental status and PCCM was consulted. Patient was intubated for airway protection and worsening HCRF, patient self extubated on 3/26, failed with continued hypoxia and was remains intubated due to ongoing AMS.  Patient underwent trach 3/29.  Patient blood pressure has improved.  Patient completed treatment for UTI.  Patient developed possible ileus due to poor mobility.  Patient now on trach support with vent at night.  Speech therapy to evaluate swallowing and speak.  Subjective: She is seen and examined, she has no complaints today she is alert and oriented.  Reported that she is hungry  Assessment & Plan: Acute metabolic encephalopathy felt to be multifactorial from hypertensive encephalopathy (PRES), infectious process and hypercarbia. Significantly improved Treating underlying causes  Acute respiratory failure with hypoxia and hypercarbia in setting of acute encephalopathy Status post trach/vent dependent PCCM following Will get speech therapy for possible Passy-Muir Out of bed as tolerated  Sepsis E. coli UTI Completed treatment, initially treated with vancomycin and Zosyn subsequently changed to Rocephin last day of treatment was 4/2.  Continue to monitor Sepsis physiology has resolved  Hypertensive urgency/Elevated troponin In setting of sepsis Felt to be due to demand ischemia from sepsis and hypertensive episode Echocardiogram LVEF 55-60% and grade 1 diastolic dysfunction no wall motion abnormalities No  ischemic workup indicated BP improved not at goal, patient on clonidine 0.1 mg 3 times a day, hydralazine 100 mg every 8 hours, labetalol 200 mg 3 times a day.  Will increase clonidine to 0.2 mg, patient was on calcium channel blocker at home, will add Norvasc 5 mg daily.  Continue hydralazine IV as needed Continue to monitor  Nausea and vomiting felt to be secondary to ileus Continue MiraLAX, will schedule Senokot Out of bed as tolerated Continue to monitor  Vulvovaginal candidiasis Diflucan  Anemia of chronic disease Hemoglobin stable Continue to monitor  Diabetes mellitus type II A1c 6.7 CBGs stable Continue SSI Monitor 24-hour insulin requirement and resume Lantus in a.m.  Severe deconditioning May benefit from LTAC  DVT prophylaxis: Lovenox Code Status: Full code Family Communication: None at bedside Disposition Plan: Hopefully LTAC  Consultants:   CCM  Procedures:   ETT 3/25>> 3/29  Trach 3/29  Antimicrobials:  Vancomycin 3/24>>> 3/27  Zosyn 3/24>>> 3/27   Rocephin 3/27>>> 4/2   Objective: Vitals:   11/17/17 0500 11/17/17 0600 11/17/17 0737 11/17/17 0800  BP: (!) 161/67 (!) 146/53 (!) 149/63 (!) 158/60  Pulse:   97   Resp: (!) 8 15 (!) 24 20  Temp:    99.2 F (37.3 C)  TempSrc:    Oral  SpO2: 100% 97%  94%  Weight:      Height:        Intake/Output Summary (Last 24 hours) at 11/17/2017 0851 Last data filed at 11/17/2017 0600 Gross per 24 hour  Intake 465.33 ml  Output 1250 ml  Net -784.67 ml   Filed Weights   11/15/17 0318 11/16/17 0400 11/17/17 0326  Weight: 88.1 kg (194 lb 3.6 oz) 88 kg (194 lb 0.1 oz) 87.8 kg (  193 lb 9 oz)    Examination:  General exam: Sitting up in chair, comfortable HEENT: Trach in place Respiratory system: Scattered rhonchi, no wheezing.  No accessory muscle use Cardiovascular system: S1 & S2 heard, RRR. No JVD, murmurs, rubs or gallops Gastrointestinal system: Abdomen is nondistended, soft and nontender.    Central nervous system: Alert and oriented.  Left-sided hemiparesis baseline Extremities: Lower extremity edema 2+ Skin: No rashes.  Data Reviewed: I have personally reviewed following labs and imaging studies  CBC: Recent Labs  Lab 11/12/17 0736 11/13/17 0521 11/14/17 0332 11/15/17 0355 11/16/17 0503  WBC 9.5 6.4 9.2 11.2* 10.9*  HGB 9.5* 10.0* 8.7* 10.4* 10.3*  HCT 30.6* 31.7* 27.5* 33.4* 33.3*  MCV 89.7 89.0 88.1 90.3 90.0  PLT 304 307 307 408* 622*   Basic Metabolic Panel: Recent Labs  Lab 11/11/17 0500 11/12/17 0736 11/13/17 0521 11/14/17 0332 11/15/17 0355 11/16/17 0503 11/17/17 0318  NA 136 137 138 134* 135 138 139  K 4.3 3.6 3.8 3.7 4.7 4.3 3.7  CL 101 99* 94* 92* 91* 95* 96*  CO2 26 29 33* 32 33* 33* 33*  GLUCOSE 122* 149* 143* 155* 222* 197* 145*  BUN 15 21* 19 25* 26* 26* 28*  CREATININE 0.71 0.75 0.72 0.76 0.80 0.76 0.81  CALCIUM 9.2 9.8 9.8 9.2 9.5 10.0 9.6  MG 1.8 1.8 1.8 2.0 1.9  --   --   PHOS 3.3 3.0 2.4* 3.5 2.9  --   --    GFR: Estimated Creatinine Clearance: 76.2 mL/min (by C-G formula based on SCr of 0.81 mg/dL). Liver Function Tests: Recent Labs  Lab 11/16/17 0503  AST 19  ALT 14  ALKPHOS 55  BILITOT 0.5  PROT 7.8  ALBUMIN 3.1*   Recent Labs  Lab 11/16/17 0503  LIPASE 21   No results for input(s): AMMONIA in the last 168 hours. Coagulation Profile: Recent Labs  Lab 11/12/17 1113  INR 1.01   Cardiac Enzymes: No results for input(s): CKTOTAL, CKMB, CKMBINDEX, TROPONINI in the last 168 hours. BNP (last 3 results) No results for input(s): PROBNP in the last 8760 hours. HbA1C: No results for input(s): HGBA1C in the last 72 hours. CBG: Recent Labs  Lab 11/16/17 1517 11/16/17 1932 11/16/17 2333 11/17/17 0317 11/17/17 0740  GLUCAP 157* 170* 133* 138* 133*   Lipid Profile: No results for input(s): CHOL, HDL, LDLCALC, TRIG, CHOLHDL, LDLDIRECT in the last 72 hours. Thyroid Function Tests: No results for input(s): TSH,  T4TOTAL, FREET4, T3FREE, THYROIDAB in the last 72 hours. Anemia Panel: No results for input(s): VITAMINB12, FOLATE, FERRITIN, TIBC, IRON, RETICCTPCT in the last 72 hours. Sepsis Labs: Recent Labs  Lab 11/15/17 0355 11/16/17 0503 11/17/17 0318  PROCALCITON 0.16 0.17 0.11    Recent Results (from the past 240 hour(s))  Blood Culture (routine x 2)     Status: None   Collection Time: 11/07/17  6:23 PM  Result Value Ref Range Status   Specimen Description   Final    BLOOD RIGHT ANTECUBITAL Performed at [facility] [address] [place], [place] [address]    Special Requests   Final    BOTTLES DRAWN AEROBIC ONLY Blood Culture adequate volume Performed at [facility] [address], [place], [place] [address]    Culture   Final    NO GROWTH 5 DAYS Performed at [facility], [place] [address] [place], [place] [address]    Report Status 11/13/2017 FINAL  Final  Blood Culture (routine x 2)     Status: None   Collection Time: 11/07/17  8:36 PM  Result Value Ref Range Status   Specimen Description   Final    BLOOD BLOOD RIGHT HAND Performed at [facility] [address] [place], [place] [address]    Special Requests   Final    BOTTLES DRAWN AEROBIC ONLY Blood Culture adequate volume Performed at [facility] [address], [place], [place] [address]    Culture   Final    NO GROWTH 5 DAYS Performed at [facility], [place] [address] [place], [place] [address]    Report Status 11/13/2017 FINAL  Final  MRSA PCR Screening     Status: Abnormal   Collection Time: 11/08/17  4:00 AM  Result Value Ref Range Status   MRSA by PCR POSITIVE (A) NEGATIVE Final    Comment:        The GeneXpert MRSA Assay (FDA approved for NASAL specimens only), is one component of a comprehensive MRSA colonization surveillance program. It is not intended to diagnose MRSA infection nor to guide or monitor  treatment for MRSA infections. RESULT CALLED TO, READ BACK BY AND VERIFIED WITH: I.HABIB AT 1221 ON 11/08/17 BY N.THOMPSON Performed at [facility], [place] [address], [place], [place] [address]   Culture, Urine     Status: Abnormal   Collection Time: 11/08/17  7:42 AM  Result Value Ref Range Status   Specimen Description   Final    URINE, CLEAN CATCH Performed at [facility], [place] [address], [place], [place] [address]    Special Requests   Final    NONE Performed at [facility], [place] [address], [place], [place] [address]    Culture >=100,000 COLONIES/mL ESCHERICHIA COLI (A)  Final   Report Status 11/10/2017 FINAL  Final   Organism ID, Bacteria ESCHERICHIA COLI (A)  Final      Susceptibility   Escherichia coli - MIC*    AMPICILLIN >=32 RESISTANT Resistant     CEFAZOLIN <=4 SENSITIVE Sensitive     CEFTRIAXONE <=1 SENSITIVE Sensitive     CIPROFLOXACIN >=4 RESISTANT Resistant     GENTAMICIN <=1 SENSITIVE Sensitive     IMIPENEM <=0.25 SENSITIVE Sensitive     NITROFURANTOIN <=16 SENSITIVE Sensitive     TRIMETH/SULFA >=320 RESISTANT Resistant     AMPICILLIN/SULBACTAM 4 SENSITIVE Sensitive     PIP/TAZO <=4 SENSITIVE Sensitive     Extended ESBL NEGATIVE Sensitive     * >=100,000 COLONIES/mL ESCHERICHIA COLI  Culture, respiratory (NON-Expectorated)     Status: None (Preliminary result)   Collection Time: 11/15/17 10:26 AM  Result Value Ref Range Status   Specimen Description   Final    TRACHEAL ASPIRATE Performed at [facility] [address] [place], [place] [address]    Special Requests   Final    NONE Performed at [facility], [place] [address] [place], [place] [address]    Gram Stain   Final    FEW WBC PRESENT,BOTH PMN AND MONONUCLEAR RARE GRAM VARIABLE ROD    Culture   Final    CULTURE REINCUBATED FOR BETTER GROWTH Performed at [facility], [place] [address],  [place],  [address]    Report Status PENDING  Incomplete      Radiology Studies: Dg Chest Port 1 View  Result Date: 11/15/2017 CLINICAL DATA:  62 year old female admitted last week  with sepsis. EXAM: PORTABLE CHEST 1 VIEW COMPARISON:  11/13/2017 and earlier. FINDINGS: Portable AP semi upright view at 0857 hours. Stable tracheostomy tube. Enteric tube courses to the left abdomen, tip not included. Mildly larger lung volumes. Decreased streaky opacity at the lung bases, residual greater on the left. No pneumothorax, pulmonary edema, pleural effusion or consolidation. Mediastinal contours are stable and within normal limits. Negative visible bowel gas pattern. No acute osseous abnormality identified. IMPRESSION: 1.  Stable lines and tubes. 2. Mild atelectasis.  No other acute cardiopulmonary abnormality. Electronically Signed   By: Genevie [name] M.D.   On: 11/15/2017 09:16   Dg Abd Portable 1v  Result Date: 11/16/2017 CLINICAL DATA:  Tachycardia.  Vomiting. EXAM: PORTABLE ABDOMEN - 1 VIEW COMPARISON:  11/14/2017. FINDINGS: Gastric tube remains within the stomach. There is moderate gaseous distention of small and large bowel. Distal colonic gas appears to be present. Query LEFT lung base consolidation or atelectasis. IMPRESSION: Moderate gaseous distension appears slightly increased from priors. Query ileus. NG tube in stomach. Electronically Signed   By: Staci Righter M.D.   On: 11/16/2017 09:25      Scheduled Meds: . atorvastatin  30 mg Per Tube QHS  . bisacodyl  10 mg Rectal Daily  . chlorhexidine gluconate (MEDLINE KIT)  15 mL Mouth Rinse BID  . cloNIDine  0.1 mg Per Tube TID  . clopidogrel  75 mg Per Tube Daily  . enoxaparin (LOVENOX) injection  40 mg Subcutaneous Q24H  . feeding supplement (PRO-STAT SUGAR FREE 64)  30 mL Per Tube BID  . feeding supplement (VITAL HIGH PROTEIN)  1,000 mL Per Tube Q24H  . fluconazole  200 mg Per Tube Daily  . hydrALAZINE  100 mg Oral Q8H  . insulin aspart  0-15  Units Subcutaneous Q4H  . labetalol  200 mg Per Tube TID  . mouth rinse  15 mL Mouth Rinse QID  . metoCLOPramide (REGLAN) injection  5 mg Intravenous Q8H  . pantoprazole sodium  40 mg Per Tube Q1200  . polyethylene glycol  17 g Oral Daily  . sodium chloride flush  3 mL Intravenous Q12H   Continuous Infusions: . sodium chloride    . niCARDipine Stopped (11/16/17 1326)     LOS: 10 days    Time spent: Total of 35 minutes spent with pt, greater than 50% of which was spent in discussion of  treatment, counseling and coordination of care  Chipper Oman, MD Pager: Text Page via [email or web address]   If 7PM-7AM, please contact night-coverage [email or web address] 11/17/2017, 8:51 AM   Note - This record has been created using [employer]. Chart creation errors have been sought, but may not always have been located. Such creation errors do not reflect on the standard of medical care.
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
## 2017-11-17 NOTE — Care Management Note (Signed)
Case Management Note  Patient Details  Name: Grace [name] MRN: [id number] Date of Birth: 23-Nov-1955  Subjective/Objective: Patient is not LTACH appropriate. CSW following for SNF.                  Action/Plan:d/c plan SNF.   Expected Discharge Date:  (unknown)               Expected Discharge Plan:  Skilled Nursing Facility  In-House Referral:  Clinical Social Work  Discharge planning Services  CM Consult  Post Acute Care Choice:    Choice offered to:     DME Arranged:    DME Agency:     HH Arranged:    HH Agency:     Status of Service:  In process, will continue to follow  If discussed at Long Length of Stay Meetings, dates discussed:    Additional Comments:  Grace [name], Grace Mishra, RN 11/17/2017, 1:15 PM

## 2017-11-17 NOTE — Evaluation (Signed)
Passy-Muir Speaking Valve - Evaluation Patient Details  Name: Grace [name] MRN: [id number] Date of Birth: [birth year]  Today's Date: 11/17/2017 Time: 0950-1025 SLP Time Calculation (min) (ACUTE ONLY): 35 min  Past Medical History:  Past Medical History:  Diagnosis Date  . Hypertension   . Stroke [facility](HCC)    Left sided weakness    Past Surgical History: History reviewed. No pertinent surgical history. HPI:  Grace LefevreGwendolyn Dimario 62 year old female with past medical history of CVA with left hemiparesis, hypertension and CAD who presented to the emergency department with fevers and elevated blood pressure.  She was admitted on 3/24 after being found encephalopathic and septic due to UTI.  On 3/25 she had acute changes on mental status and PCCM was consulted. Patient was intubated for airway protection and worsening HCRF, patient self extubated on 3/26, failed with continued hypoxia and was remains intubated due to ongoing AMS.  Patient underwent trach 3/29.  Patient completed treatment for UTI per MD notes.  Patient developed possible ileus due to poor mobility.  Patient now on trach support with vent at night.  PMSV ordered.     Assessment / Plan / Recommendation Clinical Impression  Pt with good overall tolerance of PMSV with all vitals stable however she did present with sounds of increased laryngeal congestion and mild breath stacking after use of PMSV for 10 minutes.  Prior to placement of valve - RT suctioned pt and deflated cuff.    Pt's speech is clear and deliberate fortunately.  She has a prior CVA and this is normal rate of speech since per her statement.  Voice is mildly hoarse with decreased strength per pt.  Using picture of pt, written signs, and teach back, educated pt to plans to use valve with full supervision currently.   Will follow up - Please order MBS when/if desire as pt repeatedly indicates desire for water/food and instrumental eval indicatd given h/o CVA and respiratory  status.  Thanks.     SLP Visit Diagnosis: Aphonia (R49.1)    SLP Assessment  Patient needs continued Speech Lanaguage Pathology Services    Follow Up Recommendations  (TBD)    Frequency and Duration min 2x/week  2 weeks    PMSV Trial Able to redirect subglottic air through upper airway: Yes Able to Attain Phonation: Yes Voice Quality: Low vocal intensity;Hoarse Able to Expectorate Secretions: No Breath Support for Phonation: Moderately decreased Intelligibility: Intelligible Respirations During Trial: 23(20-25) SpO2 During Trial: 97 %(95-97) Pulse During Trial: 98(96-100) Behavior: Alert;Expresses self well;Responsive to questions   Tracheostomy Tube  Additional Tracheostomy Tube Assessment Fenestrated: No  #6 Shiley Cuffed    Vent Dependency  Vent Dependent: (? at night) FiO2 (%): 28 %    Cuff Deflation Trial  GO Tolerated Cuff Deflation: Yes Length of Time for Cuff Deflation Trial: 45 minutes and ongoing Behavior: Alert;Angry        [name], Isay Perleberg [name] 11/17/2017, 10:33 AM  Donavan Burnetamara Monaye Blackie, MS [facility] SLP [phone]

## 2017-11-17 NOTE — Progress Notes (Signed)
PT Cancellation Note  Patient Details Name: Grace [name] MRN: [id number] DOB: 21-Oct-1955   Cancelled Treatment:    Reason Eval/Treat Not Completed: PT screened, no needs identified, will sign off, per RN, patient is nonambulatory PTA, requires lifting bed to chair. Currently up in recliner by nursing.    Rada HayHill, Alquan Morrish Elizabeth 11/17/2017, 8:59 AM Blanchard KelchKaren Dquan Cortopassi PT [phone]

## 2017-11-18 ENCOUNTER — Inpatient Hospital Stay (HOSPITAL_COMMUNITY): Payer: Medicaid Other

## 2017-11-18 LAB — COMPREHENSIVE METABOLIC PANEL
ALK PHOS: 47 U/L (ref 38–126)
ALT: 12 U/L — ABNORMAL LOW (ref 14–54)
ANION GAP: 10 (ref 5–15)
AST: 16 U/L (ref 15–41)
Albumin: 2.7 g/dL — ABNORMAL LOW (ref 3.5–5.0)
BUN: 24 mg/dL — ABNORMAL HIGH (ref 6–20)
CALCIUM: 9.6 mg/dL (ref 8.9–10.3)
CO2: 31 mmol/L (ref 22–32)
Chloride: 101 mmol/L (ref 101–111)
Creatinine, Ser: 0.86 mg/dL (ref 0.44–1.00)
GFR calc non Af Amer: >60 mL/min (ref >60)
Glucose, Bld: 110 mg/dL — ABNORMAL HIGH (ref 65–99)
Potassium: 3.8 mmol/L (ref 3.5–5.1)
Sodium: 142 mmol/L (ref 135–145)
Total Bilirubin: 0.7 mg/dL (ref 0.3–1.2)
Total Protein: 6.9 g/dL (ref 6.5–8.1)

## 2017-11-18 LAB — CBC WITH DIFFERENTIAL/PLATELET
Basophils Absolute: 0.1 10*3/uL (ref 0.0–0.1)
Basophils Relative: 1 %
EOS ABS: 0.2 10*3/uL (ref 0.0–0.7)
EOS PCT: 2 %
HCT: 26.7 % — ABNORMAL LOW (ref 36.0–46.0)
HEMOGLOBIN: 8.2 g/dL — AB (ref 12.0–15.0)
LYMPHS ABS: 2.1 10*3/uL (ref 0.7–4.0)
Lymphocytes Relative: 19 %
MCH: 27.8 pg (ref 26.0–34.0)
MCHC: 30.7 g/dL (ref 30.0–36.0)
MCV: 90.5 fL (ref 78.0–100.0)
MONOS PCT: 18 %
Monocytes Absolute: 1.9 10*3/uL — ABNORMAL HIGH (ref 0.1–1.0)
Neutro Abs: 6.7 10*3/uL (ref 1.7–7.7)
Neutrophils Relative %: 60 %
PLATELETS: 423 10*3/uL — AB (ref 150–400)
RBC: 2.95 MIL/uL — ABNORMAL LOW (ref 3.87–5.11)
RDW: 15.4 % (ref 11.5–15.5)
WBC: 10.9 10*3/uL — ABNORMAL HIGH (ref 4.0–10.5)

## 2017-11-18 LAB — GLUCOSE, CAPILLARY
GLUCOSE-CAPILLARY: 105 mg/dL — AB (ref 65–99)
GLUCOSE-CAPILLARY: 108 mg/dL — AB (ref 65–99)
GLUCOSE-CAPILLARY: 106 mg/dL — AB (ref 65–99)
Glucose-Capillary: 111 mg/dL — ABNORMAL HIGH (ref 65–99)
Glucose-Capillary: 105 mg/dL — ABNORMAL HIGH (ref 65–99)
Glucose-Capillary: 131 mg/dL — ABNORMAL HIGH (ref 65–99)
Glucose-Capillary: 135 mg/dL — ABNORMAL HIGH (ref 65–99)

## 2017-11-18 LAB — CULTURE, RESPIRATORY W GRAM STAIN

## 2017-11-18 LAB — CULTURE, RESPIRATORY

## 2017-11-18 MED ORDER — ACETAMINOPHEN 160 MG/5ML PO SOLN
650.0000 mg | Freq: Four times a day (QID) | ORAL | Status: DC | PRN
  Administered 2017-11-18 – 2017-11-27 (×7): 650 mg
  Filled 2017-11-18 (×7): qty 20.3

## 2017-11-18 MED ORDER — ACETAMINOPHEN 650 MG RE SUPP
650.0000 mg | Freq: Four times a day (QID) | RECTAL | Status: DC | PRN
  Administered 2017-11-18: 650 mg via RECTAL

## 2017-11-18 NOTE — Progress Notes (Addendum)
PROGRESS NOTE [facility]   [place]   DXI:[id number] DOB: [birth year]  DOA: 11/07/2017 PCP: Earlyne Iba, MD   Brief Narrative:  Grace [name] 62 year old female with past medical history of CVA with left hemiparesis, hypertension and CAD who presented to the emergency department with fevers and elevated blood pressure.  She was admitted on 3/24 after being found encephalopathic and septic due to UTI.  On 3/25 she had acute changes on mental status and PCCM was consulted. Patient was intubated for airway protection and worsening HCRF, patient self extubated on 3/26, failed with continued hypoxia and was remains intubated due to ongoing AMS.  Patient underwent trach 3/29.  Patient blood pressure has improved.  Patient completed treatment for UTI.  Patient developed possible ileus due to poor mobility.  Patient now on trach support with vent at night.  Speech therapy to evaluate swallowing and speak.  Subjective: Patient seen and examined, she is drowsy today, not interactive, however responding appropriate to question. No acute events overnight. Had fever of 100.6 this AM. Removed hen NGT last night.   Assessment & Plan: Acute metabolic encephalopathy felt to be multifactorial from hypertensive encephalopathy (PRES), infectious process and hypercarbia. Slight drowsy today - concern for aspiration CXR ordered  Treating underlying causes  Acute respiratory failure with hypoxia and hypercarbia in setting of acute encephalopathy Status post trach/vent dependent Continue management per PCCM - appreciated  SLP for PMSV and swallow eval  Out of bed as tolerated  Sepsis E. coli UTI Completed treatment, initially treated with vancomycin and Zosyn subsequently changed to Rocephin x 5 days last day of treatment was 4/2.  Continue to monitor Sepsis physiology has resolved. If fevers recurs obtain blood cultures and start abx   Hypertensive urgency/Elevated  troponin In setting of sepsis, BP significantly improved  Felt to be due to demand ischemia from sepsis and hypertensive episode Echocardiogram LVEF 55-60% and grade 1 diastolic dysfunction no wall motion abnormalities No ischemic workup indicated Continue hydralazine 100 mg every 8 hours, labetalol 200 mg 3 times a day  clonidine to 0.2 mg and Norvasc 5 mg daily.  Continue hydralazine IV as needed Continue to monitor  Nausea and vomiting felt to be secondary to ileus - resolved  Continue MiraLAX, will schedule Senokot Out of bed as tolerated Continue to monitor  Vulvovaginal candidiasis Diflucan  Anemia of chronic disease Hemoglobin stable Continue to monitor  Diabetes mellitus type II A1c 6.7 CBGs stable Continue SSI Monitor 24-hour insulin requirement and resume Lantus in a.m.  Severe deconditioning Patient is LTAC appropriated but no insurance coverage. Continue nutrition per RD, and physical therapy   DVT prophylaxis: Lovenox Code Status: Full code Family Communication: None at bedside Disposition Plan: SNF when able to wean of vent.   Consultants:   PCCM  Procedures:   ETT 3/25>> 3/29  Trach 3/29  Antimicrobials:  Vancomycin 3/24>>> 3/27  Zosyn 3/24>>> 3/27   Rocephin 3/27>>> 4/2   Objective: Vitals:   11/18/17 0600 11/18/17 0700 11/18/17 0720 11/18/17 0800  BP:      Pulse:   93   Resp: (!) 21 18 20    Temp:    100.2 F (37.9 C)  TempSrc:    Axillary  SpO2: 99% 100% 96%   Weight:      Height:        Intake/Output Summary (Last 24 hours) at 11/18/2017 0844 Last data filed at 11/18/2017 0710 Gross per 24 hour  Intake 400 ml  Output 475 ml  Net -75 ml   Filed Weights   11/15/17 0318 11/16/17 0400 11/17/17 0326  Weight: 88.1 kg (194 lb 3.6 oz) 88 kg (194 lb 0.1 oz) 87.8 kg (193 lb 9 oz)    Examination:  General exam: NAD, somnolent  HEENT: +Trach  Respiratory system: Scattered rhonchi, bibasilar BS diminished  Cardiovascular system:  S1S2 Tachy @ 110, no murmurs  Gastrointestinal system: Abd Soft, NT ND  Central nervous system: Left side hemiparesis, follow commands. Mild lethargy  Extremities: LE edema 1+ Skin: No lesions   Data Reviewed: I have personally reviewed following labs and imaging studies  CBC: Recent Labs  Lab 11/12/17 0736 11/13/17 0521 11/14/17 0332 11/15/17 0355 11/16/17 0503  WBC 9.5 6.4 9.2 11.2* 10.9*  HGB 9.5* 10.0* 8.7* 10.4* 10.3*  HCT 30.6* 31.7* 27.5* 33.4* 33.3*  MCV 89.7 89.0 88.1 90.3 90.0  PLT 304 307 307 408* 767*   Basic Metabolic Panel: Recent Labs  Lab 11/12/17 0736 11/13/17 0521 11/14/17 0332 11/15/17 0355 11/16/17 0503 11/17/17 0318  NA 137 138 134* 135 138 139  K 3.6 3.8 3.7 4.7 4.3 3.7  CL 99* 94* 92* 91* 95* 96*  CO2 29 33* 32 33* 33* 33*  GLUCOSE 149* 143* 155* 222* 197* 145*  BUN 21* 19 25* 26* 26* 28*  CREATININE 0.75 0.72 0.76 0.80 0.76 0.81  CALCIUM 9.8 9.8 9.2 9.5 10.0 9.6  MG 1.8 1.8 2.0 1.9  --   --   PHOS 3.0 2.4* 3.5 2.9  --   --    GFR: Estimated Creatinine Clearance: 76.2 mL/min (by C-G formula based on SCr of 0.81 mg/dL). Liver Function Tests: Recent Labs  Lab 11/16/17 0503  AST 19  ALT 14  ALKPHOS 55  BILITOT 0.5  PROT 7.8  ALBUMIN 3.1*   Recent Labs  Lab 11/16/17 0503  LIPASE 21   No results for input(s): AMMONIA in the last 168 hours. Coagulation Profile: Recent Labs  Lab 11/12/17 1113  INR 1.01   Cardiac Enzymes: No results for input(s): CKTOTAL, CKMB, CKMBINDEX, TROPONINI in the last 168 hours. BNP (last 3 results) No results for input(s): PROBNP in the last 8760 hours. HbA1C: No results for input(s): HGBA1C in the last 72 hours. CBG: Recent Labs  Lab 11/17/17 1619 11/17/17 2001 11/17/17 2343 11/18/17 0316 11/18/17 0759  GLUCAP 72 87 108* 111* 105*   Lipid Profile: No results for input(s): CHOL, HDL, LDLCALC, TRIG, CHOLHDL, LDLDIRECT in the last 72 hours. Thyroid Function Tests: No results for input(s):  TSH, T4TOTAL, FREET4, T3FREE, THYROIDAB in the last 72 hours. Anemia Panel: No results for input(s): VITAMINB12, FOLATE, FERRITIN, TIBC, IRON, RETICCTPCT in the last 72 hours. Sepsis Labs: Recent Labs  Lab 11/15/17 0355 11/16/17 0503 11/17/17 0318  PROCALCITON 0.16 0.17 0.11    Recent Results (from the past 240 hour(s))  Culture, respiratory (NON-Expectorated)     Status: None (Preliminary result)   Collection Time: 11/15/17 10:26 AM  Result Value Ref Range Status   Specimen Description   Final    TRACHEAL ASPIRATE Performed at [facility] [address], [place], [place] [address]    Special Requests   Final    NONE Performed at [facility], [place] [address], [place], [place] [address]    Gram Stain   Final    FEW WBC PRESENT,BOTH PMN AND MONONUCLEAR RARE GRAM VARIABLE ROD    Culture   Final    FEW YEAST IDENTIFICATION TO FOLLOW Performed at  [facility], [place] [address] [place],  [address]    Report Status PENDING  Incomplete     Radiology Studies: Dg Abd 1 View  Result Date: 11/17/2017 CLINICAL DATA:  Nasogastric tube placement. EXAM: ABDOMEN - 1 VIEW COMPARISON:  Radiograph of November 16, 2017. FINDINGS: The bowel gas pattern is normal. Distal tip of nasogastric tube is seen in expected position of distal stomach. IMPRESSION: No evidence of bowel obstruction or ileus. Distal tip of nasogastric tube seen in expected position of distal stomach. Electronically Signed   By: Marijo Conception, M.D.   On: 11/17/2017 19:01   Dg Abd Portable 1v  Result Date: 11/16/2017 CLINICAL DATA:  Tachycardia.  Vomiting. EXAM: PORTABLE ABDOMEN - 1 VIEW COMPARISON:  11/14/2017. FINDINGS: Gastric tube remains within the stomach. There is moderate gaseous distention of small and large bowel. Distal colonic gas appears to be present. Query LEFT lung base consolidation or atelectasis. IMPRESSION: Moderate gaseous distension appears slightly increased  from priors. Query ileus. NG tube in stomach. Electronically Signed   By: Staci Righter M.D.   On: 11/16/2017 09:25    Scheduled Meds: . amLODipine  5 mg Per Tube Daily  . atorvastatin  30 mg Per Tube QHS  . bisacodyl  10 mg Rectal Daily  . chlorhexidine gluconate (MEDLINE KIT)  15 mL Mouth Rinse BID  . cloNIDine  0.2 mg Per Tube TID  . clopidogrel  75 mg Per Tube Daily  . enoxaparin (LOVENOX) injection  40 mg Subcutaneous Q24H  . feeding supplement (PRO-STAT SUGAR FREE 64)  30 mL Per Tube BID  . feeding supplement (VITAL HIGH PROTEIN)  1,000 mL Per Tube Q24H  . hydrALAZINE  100 mg Oral Q8H  . insulin aspart  0-15 Units Subcutaneous Q4H  . labetalol  200 mg Per Tube TID  . mouth rinse  15 mL Mouth Rinse QID  . metoCLOPramide (REGLAN) injection  5 mg Intravenous Q8H  . pantoprazole sodium  40 mg Per Tube Q1200  . polyethylene glycol  17 g Oral Daily  . senna-docusate  1 tablet Per Tube BID  . sodium chloride flush  3 mL Intravenous Q12H   Continuous Infusions: . sodium chloride       LOS: 11 days    Time spent: Total of 35 minutes spent with pt, greater than 50% of which was spent in discussion of  treatment, counseling and coordination of care  Chipper Oman, MD Pager: Text Page via [email or web address]   If 7PM-7AM, please contact night-coverage [email or web address] 11/18/2017, 8:44 AM   Note - This record has been created using [employer]. Chart creation errors have been sought, but may not always have been located. Such creation errors do not reflect on the standard of medical care.

## 2017-11-18 NOTE — Progress Notes (Signed)
CSW following to assist with SNF placement. CSW has been in contact with SNF they are hoping to continue managing the patient care with new Trach.   Vivi BarrackNicole Deardra Hinkley, Theresia MajorsLCSWA, MSW Clinical Social Worker  [phone] 11/18/2017  8:48 AM

## 2017-11-18 NOTE — Progress Notes (Signed)
SLP Cancellation Note  Patient Details Name: Grace LefevreGwendolyn Mynhier MRN: [id number] DOB: 12/22/1955   Cancelled treatment:       Reason Eval/Treat Not Completed: Other (comment)(spoke to RN re: potential for MBS especially given pt continues to pull out feeding tubes - RN reports pt drowsy today and concern for aspiration present, when MD deems appropriate, please order MBS, will follow up re: PMSV - thanks)   [name], Deuntae Kocsis [name] 11/18/2017, 9:50 AM  Donavan Burnetamara Risa Auman, MS [facility] SLP [phone]
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
## 2017-11-18 NOTE — Progress Notes (Signed)
SLP Cancellation Note  Patient Details Name: Grace LefevreGwendolyn [name] MRN: [id number] DOB: [birth year]   Cancelled treatment:       Reason Eval/Treat Not Completed: Fatigue/lethargy limiting ability to participate(pt fatigued today, not alert adequately for pmsv tx)   [name], Laksh Hinners [name] 11/18/2017, 11:22 AM  Donavan Burnetamara Damya Comley, MS [facility] SLP [phone]

## 2017-11-19 ENCOUNTER — Inpatient Hospital Stay (HOSPITAL_COMMUNITY): Payer: Medicaid Other

## 2017-11-19 DIAGNOSIS — Z930 Tracheostomy status: Secondary | ICD-10-CM

## 2017-11-19 LAB — BASIC METABOLIC PANEL
Anion gap: 9 (ref 5–15)
BUN: 22 mg/dL — AB (ref 6–20)
CALCIUM: 9.6 mg/dL (ref 8.9–10.3)
CO2: 29 mmol/L (ref 22–32)
Chloride: 103 mmol/L (ref 101–111)
Creatinine, Ser: 0.83 mg/dL (ref 0.44–1.00)
GFR calc Af Amer: >60 mL/min (ref >60)
Glucose, Bld: 145 mg/dL — ABNORMAL HIGH (ref 65–99)
POTASSIUM: 3.6 mmol/L (ref 3.5–5.1)
SODIUM: 141 mmol/L (ref 135–145)

## 2017-11-19 LAB — CBC WITH DIFFERENTIAL/PLATELET
BASOS ABS: 0 10*3/uL (ref 0.0–0.1)
BASOS PCT: 0 %
EOS ABS: 0.2 10*3/uL (ref 0.0–0.7)
EOS PCT: 2 %
HCT: 28.6 % — ABNORMAL LOW (ref 36.0–46.0)
Hemoglobin: 8.7 g/dL — ABNORMAL LOW (ref 12.0–15.0)
Lymphocytes Relative: 22 %
Lymphs Abs: 2.2 10*3/uL (ref 0.7–4.0)
MCH: 27.7 pg (ref 26.0–34.0)
MCHC: 30.4 g/dL (ref 30.0–36.0)
MCV: 91.1 fL (ref 78.0–100.0)
Monocytes Absolute: 1.5 10*3/uL — ABNORMAL HIGH (ref 0.1–1.0)
Monocytes Relative: 15 %
Neutro Abs: 6.2 10*3/uL (ref 1.7–7.7)
Neutrophils Relative %: 61 %
PLATELETS: 459 10*3/uL — AB (ref 150–400)
RBC: 3.14 MIL/uL — AB (ref 3.87–5.11)
RDW: 15.6 % — ABNORMAL HIGH (ref 11.5–15.5)
WBC: 10.2 10*3/uL (ref 4.0–10.5)

## 2017-11-19 LAB — GLUCOSE, CAPILLARY
GLUCOSE-CAPILLARY: 140 mg/dL — AB (ref 65–99)
GLUCOSE-CAPILLARY: 116 mg/dL — AB (ref 65–99)
GLUCOSE-CAPILLARY: 84 mg/dL (ref 65–99)
GLUCOSE-CAPILLARY: 111 mg/dL — AB (ref 65–99)
Glucose-Capillary: 131 mg/dL — ABNORMAL HIGH (ref 65–99)

## 2017-11-19 MED ORDER — FREE WATER
100.0000 mL | Freq: Three times a day (TID) | Status: DC
  Administered 2017-11-19 – 2017-11-24 (×15): 100 mL

## 2017-11-19 MED ORDER — OSMOLITE 1.2 CAL PO LIQD
1000.0000 mL | ORAL | Status: DC
  Administered 2017-11-19 – 2017-11-24 (×6): 1000 mL
  Filled 2017-11-19: qty 1000

## 2017-11-19 NOTE — Progress Notes (Signed)
OT Cancellation Note  Patient Details Name: Grace [name] MRN: [id number] DOB: 1955/11/28   Cancelled Treatment:    Reason Eval/Treat Not Completed: OT screened, no needs identified, will sign off.  [facility].  Will defer OT evaluation back to that venue.  Abena Erdman 11/19/2017, 2:04 PM  Marica OtterMaryellen Geanine Vandekamp, OTR/L [phone] 11/19/2017

## 2017-11-19 NOTE — Progress Notes (Signed)
PT Cancellation Note  Patient Details Name: Grace LefevreGwendolyn Haeberle MRN: [id number] DOB: 11-04-1955   Cancelled Treatment:    Reason Eval/Treat Not Completed: PT screened, no needs identified, will sign off Pt screened 11/17/17 and not appropriate for skilled acute PT.  Pt is nonambulatory at baseline and resident of SNF.  Recommend OOB with lift if OOB is desired.  PT to sign off.   Damaso Laday,KATHrine E 11/19/2017, 12:34 PM Zenovia JarredKati Jakyria Bleau, PT, DPT 11/19/2017 Pager: [phone]

## 2017-11-19 NOTE — Progress Notes (Signed)
  Speech Language Pathology Treatment: [name] Speaking valve  Patient Details Name: Grace [name] MRN: [id number] DOB: 03-15-1956 Today's Date: 11/19/2017 Time: 1410-1420 SLP Time Calculation (min) (ACUTE ONLY): 10 min  Assessment / Plan / Recommendation Clinical Impression  RN reported MD tried PMSV this morning, and pt was unable to achieve voicing. RN also indicated pt displeased with new NG tube. Pt was willing to try PMSV attempt today, however, she was unable to maintain O2 sats with immediate drop upon placement. Pt was unable to demonstrate voicing at this time. No air stacking evident upon removal of valve. ST will continue efforts for increasing tolerance of PMSV, and completion of MBS when appropriate.   HPI HPI: Grace LefevreGwendolyn Walsh 62 year old female with past medical history of CVA with left hemiparesis, hypertension and CAD who presented to the emergency department with fevers and elevated blood pressure.  She was admitted on 3/24 after being found encephalopathic and septic due to UTI.  On 3/25 she had acute changes on mental status and PCCM was consulted. Patient was intubated for airway protection and worsening HCRF, patient self extubated on 3/26, failed with continued hypoxia and was remains intubated due to ongoing AMS.  Patient underwent trach 3/29.  Patient completed treatment for UTI per MD notes.  Patient developed possible ileus due to poor mobility.  Patient now on trach support with vent at night.  PMSV ordered.        SLP Plan  Continue with current plan of care          Patient may use Passy-Muir Speech Valve: with SLP only;Intermittently with supervision PMSV Supervision: Full MD: Please consider changing trach tube to : Cuffless         Follow up Recommendations: (TBD) SLP Visit Diagnosis: Aphonia (R49.1) Plan: Continue with current plan of care       GO              Celia B. Murvin NatalBueche, [facility] Hermann Katy HospitalMSP, CCC-SLP Speech Language  Pathologist [phone]  Leigh AuroraBueche, Celia Brown 11/19/2017, 2:22 PM

## 2017-11-19 NOTE — Progress Notes (Signed)
Pt frustrated and pulled out NGT and IV.  Pt reassured and emotional support given.  Pt following all commands does not appear to be confused.  She said she got frustrated.

## 2017-11-19 NOTE — Progress Notes (Addendum)
Ok to leave NGT out for the rest of the night until re evaluate in the morning per Dr. Katrinka BlazingSmith.

## 2017-11-19 NOTE — Progress Notes (Signed)
Ok to use NGT per Dr. Burton Apleygden [facility] and Dr. Katrinka BlazingSmith.  TF restarted

## 2017-11-19 NOTE — Progress Notes (Signed)
Pt pulled out NGT.  She is alert and oriented.  Pt reminded of complications that occur with pulling out tubes.  Dr. Katrinka BlazingSmith paged to notify.

## 2017-11-19 NOTE — Progress Notes (Signed)
Nutrition Follow-up  DOCUMENTATION CODES:   Obesity unspecified  INTERVENTION:  - Will adjust TF regimen: Osmolite 1.2 @ 20 mL/hr to advance by 10 mL every 8 hours to reach goal are of Osmolite 1.2 @ 60 mL/hr. At goal rate, this regimen will provide 1728 kcal, 80 grams of protein, and 1181 mL free water. - Will order 100 mL free water TID (300 mL/day). - Will continue to monitor for GI symptoms and need to adjust TF d/t this.   NUTRITION DIAGNOSIS:   Inadequate oral intake related to inability to eat as evidenced by NPO status. -ongoing  GOAL:   Patient will meet greater than or equal to 90% of their needs -unmet   MONITOR:   TF tolerance, Weight trends, Labs  ASSESSMENT:   62 yo female admitted from SNF on 3/24 with abdominal pain secondary to sepsis, AMS. PMH HTN, stroke, DM  Pt now on trach collar, off of vent support. Updated estimated nutrition needs in light of this and needs based on current weight (88.4 kg). Reviewed RN notes which indicates that pt pulled NGT x2 since yesterday. NGT replaced and now with tube in R nare. Abdominal x-ray order in place for tube placement verification. TF on hold at this time.   Pt noted to have an ileus previously. Flow sheet indicates that pt had a large, soft BM yesterday at 1:50 PM, Will order TF as outlined above to start after tube placement verified. SLP working with pt for usage of PMV and hopeful for pt to be able to transition to oral diet in the future.   Medications reviewed; 10 mg rectal Dulcolax/day, sliding scale Novolog, 5 mg IV Reglan TID, 1 packet Miralax/day, 1 tablet Senokot BID.   Labs reviewed; CBGs: 140, 116, and 131 mg/dL this AM, BUN: 22 mg/dL.        Diet Order:  Diet NPO time specified  EDUCATION NEEDS:   Not appropriate for education at this time  Skin:  Other: excoriated marks BL arms with ecchymosis  Last BM:  4/4  Height:   Ht Readings from Last 1 Encounters:  11/11/17 5\' 4"  (1.626 m)     Weight:   Wt Readings from Last 1 Encounters:  11/19/17 194 lb 14.2 oz (88.4 kg)    Ideal Body Weight:  54.5 kg  BMI:  Body mass index is 33.45 kg/m.  Estimated Nutritional Needs:   Kcal:  1590-1770 (18-20 kcal/kg)  Protein:  80-90 grams  Fluid:  >=1.8 L/day     Trenton GammonJessica Ulyses Panico, MS, RD, LDN, [facility] Inpatient Clinical Dietitian Pager # [phone] After hours/weekend pager # [phone]
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
## 2017-11-19 NOTE — Progress Notes (Signed)
PROGRESS NOTE [facility]   [place]   KGM:[id number] DOB: March 06, 1956  DOA: 11/07/2017 PCP: Earlyne Iba, MD   Brief Narrative:  Grace [name] 62 year old female with past medical history of CVA with left hemiparesis, hypertension and CAD who presented to the emergency department with fevers and elevated blood pressure.  She was admitted on 3/24 after being found encephalopathic and septic due to UTI.  On 3/25 she had acute changes on mental status and PCCM was consulted. Patient was intubated for airway protection and worsening HCRF, patient self extubated on 3/26, failed with continued hypoxia and was remains intubated due to ongoing AMS.  Patient underwent trach 3/29.  Patient blood pressure has improved.  Patient completed treatment for UTI.  Patient developed possible ileus due to poor mobility.  Patient now on trach support with vent at night.  Speech therapy to evaluate swallowing and speak.  Subjective: Patient seen and examined, awake and alert today, removed NG tube overnight  for the second time.  Afebrile.  Denies chest pain, shortness of breath and abdominal pain.  Assessment & Plan: Acute metabolic encephalopathy felt to be multifactorial from hypertensive encephalopathy (PRES), infectious process and hypercarbia. Continues to improve Treating underlying causes  Acute respiratory failure with hypoxia and hypercarbia in setting of acute encephalopathy Status post trach/vent dependent Management per pulmonary/critical care Follow-up with speech therapy for PMSV and swallow assessment OOB, PT as able  Sepsis E. coli UTI Completed treatment, initially treated with vancomycin and Zosyn subsequently changed to Rocephin x 5 days last day of treatment was 4/2.  Continue to monitor Sepsis physiology has resolved.  Remains afebrile.  Chest x-ray with no changes  Hypertensive urgency/Elevated troponin In setting of sepsis, BP significantly improved    Felt to be due to demand ischemia from sepsis and hypertensive episode Echocardiogram LVEF 55-60% and grade 1 diastolic dysfunction no wall motion abnormalities No ischemic workup indicated Continue hydralazine 100 mg every 8 hours, labetalol 200 mg 3 times a day  clonidine to 0.2 mg and Norvasc 5 mg daily.  Continue hydralazine IV as needed Continue to monitor  Nausea and vomiting felt to be secondary to ileus - resolved  Continue MiraLAX, Senokot Out of bed as tolerated Continue to monitor  Vulvovaginal candidiasis Completed treatment with Diflucan  Anemia of chronic disease Slight drop in H&H, no overt bleeding. Continue to monitor.  Transfuse if less than 7  Diabetes mellitus type II A1c 6.7 CBGs stable Continue SSI Monitor 24-hour insulin requirement and resume Lantus in a.m.  Severe deconditioning PT as tolerated  DVT prophylaxis: Lovenox Code Status: Full code Family Communication: None at bedside Disposition Plan: SNF when able to wean off the vent, she will stay of dependent for this weekend and if she does well trach will be downsized.  Will continue working on nutrition and physical strengthening  Consultants:   PCCM  Procedures:   ETT 3/25>> 3/29  Trach 3/29  Antimicrobials:  Vancomycin 3/24>>> 3/27  Zosyn 3/24>>> 3/27   Rocephin 3/27>>> 4/2   Objective: Vitals:   11/19/17 0500 11/19/17 0600 11/19/17 0728 11/19/17 0812  BP: (!) 172/73 (!) 181/74 (!) 178/85   Pulse:   79   Resp: 17 (!) 30 16   Temp:    99.1 F (37.3 C)  TempSrc:    Oral  SpO2: 100% 99% 98%   Weight: 88.4 kg (194 lb 14.2 oz)     Height:        Intake/Output Summary (Last 24  hours) at 11/19/2017 0945 Last data filed at 11/19/2017 0400 Gross per 24 hour  Intake 849.33 ml  Output 850 ml  Net -0.67 ml   Filed Weights   11/16/17 0400 11/17/17 0326 11/19/17 0500  Weight: 88 kg (194 lb 0.1 oz) 87.8 kg (193 lb 9 oz) 88.4 kg (194 lb 14.2 oz)    Examination:  General: Pt is  alert, awake, not in acute distress Cardiovascular: RRR, S1/S2 + Respiratory: Good air entry, bronchial sounds + trach Abdominal: Soft, NT, ND, bowel sounds + Extremities: no edema  Data Reviewed: I have personally reviewed following labs and imaging studies  CBC: Recent Labs  Lab 11/14/17 0332 11/15/17 0355 11/16/17 0503 11/18/17 0946 11/19/17 0314  WBC 9.2 11.2* 10.9* 10.9* 10.2  NEUTROABS  --   --   --  6.7 6.2  HGB 8.7* 10.4* 10.3* 8.2* 8.7*  HCT 27.5* 33.4* 33.3* 26.7* 28.6*  MCV 88.1 90.3 90.0 90.5 91.1  PLT 307 408* 462* 423* 299*   Basic Metabolic Panel: Recent Labs  Lab 11/13/17 0521 11/14/17 0332 11/15/17 0355 11/16/17 0503 11/17/17 0318 11/18/17 0946 11/19/17 0314  NA 138 134* 135 138 139 142 141  K 3.8 3.7 4.7 4.3 3.7 3.8 3.6  CL 94* 92* 91* 95* 96* 101 103  CO2 33* 32 33* 33* 33* 31 29  GLUCOSE 143* 155* 222* 197* 145* 110* 145*  BUN 19 25* 26* 26* 28* 24* 22*  CREATININE 0.72 0.76 0.80 0.76 0.81 0.86 0.83  CALCIUM 9.8 9.2 9.5 10.0 9.6 9.6 9.6  MG 1.8 2.0 1.9  --   --   --   --   PHOS 2.4* 3.5 2.9  --   --   --   --    GFR: Estimated Creatinine Clearance: 74.7 mL/min (by C-G formula based on SCr of 0.83 mg/dL). Liver Function Tests: Recent Labs  Lab 11/16/17 0503 11/18/17 0946  AST 19 16  ALT 14 12*  ALKPHOS 55 47  BILITOT 0.5 0.7  PROT 7.8 6.9  ALBUMIN 3.1* 2.7*   Recent Labs  Lab 11/16/17 0503  LIPASE 21   No results for input(s): AMMONIA in the last 168 hours. Coagulation Profile: Recent Labs  Lab 11/12/17 1113  INR 1.01   Cardiac Enzymes: No results for input(s): CKTOTAL, CKMB, CKMBINDEX, TROPONINI in the last 168 hours. BNP (last 3 results) No results for input(s): PROBNP in the last 8760 hours. HbA1C: No results for input(s): HGBA1C in the last 72 hours. CBG: Recent Labs  Lab 11/18/17 1632 11/18/17 1959 11/18/17 2320 11/19/17 0313 11/19/17 0801  GLUCAP 131* 135* 106* 140* 116*   Lipid Profile: No results for  input(s): CHOL, HDL, LDLCALC, TRIG, CHOLHDL, LDLDIRECT in the last 72 hours. Thyroid Function Tests: No results for input(s): TSH, T4TOTAL, FREET4, T3FREE, THYROIDAB in the last 72 hours. Anemia Panel: No results for input(s): VITAMINB12, FOLATE, FERRITIN, TIBC, IRON, RETICCTPCT in the last 72 hours. Sepsis Labs: Recent Labs  Lab 11/15/17 0355 11/16/17 0503 11/17/17 0318  PROCALCITON 0.16 0.17 0.11    Recent Results (from the past 240 hour(s))  Culture, respiratory (NON-Expectorated)     Status: None   Collection Time: 11/15/17 10:26 AM  Result Value Ref Range Status   Specimen Description   Final    TRACHEAL ASPIRATE Performed at [facility] [address], [place], [place] [address]    Special Requests   Final    NONE Performed at [facility], [address] [place], [place] [address]    Gram Stain   Final    FEW WBC PRESENT,BOTH PMN AND MONONUCLEAR RARE GRAM VARIABLE ROD Performed at [facility], [place] [address] [place], [place] [address]    Culture FEW CANDIDA DUBLINIENSIS  Final   Report Status 11/18/2017 FINAL  Final     Radiology Studies: Dg Abd 1 View  Result Date: 11/18/2017 CLINICAL DATA:  NG tube placement EXAM: ABDOMEN - 1 VIEW COMPARISON:  11/18/2017 FINDINGS: Esophageal tube tip overlies the expected location of gastric body. Right upper quadrant calcifications corresponding to gallstones. Visible upper gas pattern nonobstructed. IMPRESSION: Esophageal tube tip overlies expected location of gastric body. Electronically Signed   By: Donavan Foil M.D.   On: 11/18/2017 20:56   Dg Abd 1 View  Result Date: 11/18/2017 CLINICAL DATA:  Encounter for nasogastric tube placement. EXAM: ABDOMEN - 1 VIEW COMPARISON:  Radiograph of November 18, 2017. FINDINGS: The bowel gas pattern is normal. Distal tip of nasogastric tube is seen in expected position of proximal stomach. Surgical clips are noted in the abdomen. Cholelithiasis is  noted. IMPRESSION: Distal tip of nasogastric tube seen in expected position of proximal stomach. No evidence of bowel obstruction or ileus. Electronically Signed   By: Marijo Conception, M.D.   On: 11/18/2017 12:19   Dg Abd 1 View  Result Date: 11/18/2017 CLINICAL DATA:  Nasogastric tube placement EXAM: ABDOMEN - 1 VIEW COMPARISON:  Portable exam 1013 hours compared to 11/17/2017 FINDINGS: Tip of nasogastric tube projects over the distal esophagus, recommend advancing tube 12 cm into stomach. Bowel gas pattern normal. Calcified gallstones in  RIGHT upper quadrant. LEFT mid abdomen surgical clips. No acute osseous findings. IMPRESSION: Tip of nasogastric tube is at the distal esophagus; recommend advancing tube 12 cm. Cholelithiasis. Electronically Signed   By: Lavonia Dana M.D.   On: 11/18/2017 10:53   Dg Abd 1 View  Result Date: 11/17/2017 CLINICAL DATA:  Nasogastric tube placement. EXAM: ABDOMEN - 1 VIEW COMPARISON:  Radiograph of November 16, 2017. FINDINGS: The bowel gas pattern is normal. Distal tip of nasogastric tube is seen in expected position of distal stomach. IMPRESSION: No evidence of bowel obstruction or ileus. Distal tip of nasogastric tube seen in expected position of distal stomach. Electronically Signed   By: Marijo Conception, M.D.   On: 11/17/2017 19:01   Dg Chest Port 1 View  Result Date: 11/18/2017 CLINICAL DATA:  Possible UTI. EXAM: PORTABLE CHEST 1 VIEW COMPARISON:  11/15/2017. FINDINGS: Tracheostomy tube noted with tip over the trachea. Interval removal of NG tube. Cardiomegaly with normal pulmonary vascularity. No focal infiltrate. No pleural effusion or pneumothorax. Degenerative change thoracic spine. IMPRESSION: 1. Interval removal of NG tube. Tracheostomy tube noted with tip over the trachea. 2. Cardiomegaly with normal pulmonary vascularity. No acute pulmonary disease noted. Electronically Signed   By: Marcello Moores  Register   On: 11/18/2017 09:41    Scheduled Meds: . amLODipine  5 mg  Per Tube Daily  . atorvastatin  30 mg Per Tube QHS  . bisacodyl  10 mg Rectal Daily  . chlorhexidine gluconate (MEDLINE KIT)  15 mL Mouth Rinse BID  . cloNIDine  0.2 mg Per Tube TID  . clopidogrel  75 mg Per Tube Daily  . enoxaparin (LOVENOX) injection  40 mg Subcutaneous Q24H  . feeding supplement (PRO-STAT SUGAR FREE 64)  30 mL Per Tube BID  . feeding supplement (VITAL HIGH PROTEIN)  1,000 mL Per Tube Q24H  .  hydrALAZINE  100 mg Oral Q8H  . insulin aspart  0-15 Units Subcutaneous Q4H  . labetalol  200 mg Per Tube TID  . mouth rinse  15 mL Mouth Rinse QID  . metoCLOPramide (REGLAN) injection  5 mg Intravenous Q8H  . pantoprazole sodium  40 mg Per Tube Q1200  . polyethylene glycol  17 g Oral Daily  . senna-docusate  1 tablet Per Tube BID  . sodium chloride flush  3 mL Intravenous Q12H   Continuous Infusions: . sodium chloride 10 mL/hr at 11/19/17 0400     LOS: 12 days    Time spent: Total of 25 minutes spent with pt, greater than 50% of which was spent in discussion of  treatment, counseling and coordination of care  Chipper Oman, MD Pager: Text Page via [email or web address]   If 7PM-7AM, please contact night-coverage [email or web address] 11/19/2017, 9:45 AM   Note - This record has been created using [employer]. Chart creation errors have been sought, but may not always have been located. Such creation errors do not reflect on the standard of medical care.

## 2017-11-20 LAB — CBC
HCT: 30 % — ABNORMAL LOW (ref 36.0–46.0)
HEMOGLOBIN: 8.9 g/dL — AB (ref 12.0–15.0)
MCH: 27.3 pg (ref 26.0–34.0)
MCHC: 29.7 g/dL — ABNORMAL LOW (ref 30.0–36.0)
MCV: 92 fL (ref 78.0–100.0)
PLATELETS: 480 10*3/uL — AB (ref 150–400)
RBC: 3.26 MIL/uL — AB (ref 3.87–5.11)
RDW: 15.5 % (ref 11.5–15.5)
WBC: 9.6 10*3/uL (ref 4.0–10.5)

## 2017-11-20 LAB — GLUCOSE, CAPILLARY
GLUCOSE-CAPILLARY: 111 mg/dL — AB (ref 65–99)
GLUCOSE-CAPILLARY: 146 mg/dL — AB (ref 65–99)
GLUCOSE-CAPILLARY: 161 mg/dL — AB (ref 65–99)
GLUCOSE-CAPILLARY: 169 mg/dL — AB (ref 65–99)
GLUCOSE-CAPILLARY: 170 mg/dL — AB (ref 65–99)
Glucose-Capillary: 148 mg/dL — ABNORMAL HIGH (ref 65–99)
Glucose-Capillary: 175 mg/dL — ABNORMAL HIGH (ref 65–99)

## 2017-11-20 MED ORDER — MUPIROCIN 2 % EX OINT
1.0000 "application " | TOPICAL_OINTMENT | Freq: Two times a day (BID) | CUTANEOUS | Status: AC
  Administered 2017-11-20 – 2017-11-24 (×10): 1 via NASAL
  Filled 2017-11-20: qty 22

## 2017-11-20 MED ORDER — CHLORHEXIDINE GLUCONATE CLOTH 2 % EX PADS
6.0000 | MEDICATED_PAD | Freq: Every day | CUTANEOUS | Status: DC
  Administered 2017-11-20: 6 via TOPICAL

## 2017-11-20 NOTE — Progress Notes (Signed)
PROGRESS NOTE [facility]   [place]   KWI:[id number] DOB: 08/07/1956  DOA: 11/07/2017 PCP: Earlyne Iba, MD   Brief Narrative:  [name] 62 year old female with past medical history of CVA with left hemiparesis, hypertension and CAD who presented to the emergency department with fevers and elevated blood pressure.  She was admitted on 3/24 after being found encephalopathic and septic due to UTI.  On 3/25 she had acute changes on mental status and PCCM was consulted. Patient was intubated for airway protection and worsening HCRF, patient self extubated on 3/26, failed with continued hypoxia and was re-intubated due to ongoing AMS.  Patient underwent trach 3/29.  Patient blood pressure has improved.  Patient completed treatment for UTI.  Patient developed possible ileus due to poor mobility, which now has resolved.  Patient now on trach collar. Nutrition via NGT. SLP working on [employer] and voice.   Subjective: Patient reports being tired and having slight difficulty taking a deep breath.  Was of vent support last night.  Assessment & Plan: Acute metabolic encephalopathy felt to be multifactorial from hypertensive encephalopathy (PRES), infectious process and hypercarbia. Slowly improving Treating underlying causes  Acute respiratory failure with hypoxia and hypercarbia in setting of acute encephalopathy Status post trach/vent dependent Continue oxygenation through trach collar only.  Hold vent support Management per pulmonary/critical care Follow-up with speech therapy for PMSV and swallow assessment OOB, PT as able  Sepsis E. coli UTI - resolved Completed treatment, initially treated with vancomycin and Zosyn subsequently changed to Rocephin x 5 days last day of treatment was 4/2.  Continue to monitor Sepsis physiology has resolved.  Remains afebrile  Hypertensive urgency/Elevated troponin In setting of sepsis, BP remains stable Felt to be due to  demand ischemia from sepsis and hypertensive episode Echocardiogram LVEF 55-60% and grade 1 diastolic dysfunction no wall motion abnormalities No ischemic workup indicated Continue hydralazine 100 mg every 8 hours, labetalol 200 mg 3 times a day  clonidine to 0.2 mg and Norvasc 5 mg daily.  Continue hydralazine IV as needed Continue to monitor  Nausea and vomiting felt to be secondary to ileus - resolved  Continue MiraLAX, Senokot Out of bed as tolerated Continue to monitor  Vulvovaginal candidiasis Completed treatment with Diflucan  Anemia of chronic disease Hemoglobin stable.  No signs of overt bleeding Monitor intermittently  Diabetes mellitus type II A1c 6.7 CBGs remained stable Continue SSI Not requiring much insulin, will continue with sliding scale only.  Severe deconditioning PT as tolerated  DVT prophylaxis: Lovenox Code Status: Full code Family Communication: None at bedside Disposition Plan: SNF when able to wean off the vent, she will stay of the vent for this weekend and if she does well trach will be downsized.  Will continue working on nutrition and physical strengthening  Consultants:   PCCM  Procedures:   ETT 3/25>> 3/29  Trach 3/29  Antimicrobials:  Vancomycin 3/24>>> 3/27  Zosyn 3/24>>> 3/27   Rocephin 3/27>>> 4/2   Objective: Vitals:   11/20/17 0400 11/20/17 0600 11/20/17 0659 11/20/17 0800  BP: (!) 157/60 (!) 165/64 (!) 165/64   Pulse:      Resp: (!) 31 (!) 23    Temp: 99.1 F (37.3 C)   99.1 F (37.3 C)  TempSrc: Axillary   Oral  SpO2: 98% 98%    Weight:      Height:        Intake/Output Summary (Last 24 hours) at 11/20/2017 0847 Last data filed at 11/20/2017 0800  Gross per 24 hour  Intake 642.33 ml  Output 650 ml  Net -7.67 ml   Filed Weights   11/16/17 0400 11/17/17 0326 11/19/17 0500  Weight: 88 kg (194 lb 0.1 oz) 87.8 kg (193 lb 9 oz) 88.4 kg (194 lb 14.2 oz)    Examination: No change in exam from 11/19/2017  General:  Pt is alert, awake, not in acute distress Cardiovascular: RRR, S1/S2 + Respiratory: Good air entry, bronchial sounds + trach Abdominal: Soft, NT, ND, bowel sounds + Extremities: no edema  Data Reviewed: I have personally reviewed following labs and imaging studies  CBC: Recent Labs  Lab 11/15/17 0355 11/16/17 0503 11/18/17 0946 11/19/17 0314 11/20/17 0324  WBC 11.2* 10.9* 10.9* 10.2 9.6  NEUTROABS  --   --  6.7 6.2  --   HGB 10.4* 10.3* 8.2* 8.7* 8.9*  HCT 33.4* 33.3* 26.7* 28.6* 30.0*  MCV 90.3 90.0 90.5 91.1 92.0  PLT 408* 462* 423* 459* 354*   Basic Metabolic Panel: Recent Labs  Lab 11/14/17 0332 11/15/17 0355 11/16/17 0503 11/17/17 0318 11/18/17 0946 11/19/17 0314  NA 134* 135 138 139 142 141  K 3.7 4.7 4.3 3.7 3.8 3.6  CL 92* 91* 95* 96* 101 103  CO2 32 33* 33* 33* 31 29  GLUCOSE 155* 222* 197* 145* 110* 145*  BUN 25* 26* 26* 28* 24* 22*  CREATININE 0.76 0.80 0.76 0.81 0.86 0.83  CALCIUM 9.2 9.5 10.0 9.6 9.6 9.6  MG 2.0 1.9  --   --   --   --   PHOS 3.5 2.9  --   --   --   --    GFR: Estimated Creatinine Clearance: 74.7 mL/min (by C-G formula based on SCr of 0.83 mg/dL). Liver Function Tests: Recent Labs  Lab 11/16/17 0503 11/18/17 0946  AST 19 16  ALT 14 12*  ALKPHOS 55 47  BILITOT 0.5 0.7  PROT 7.8 6.9  ALBUMIN 3.1* 2.7*   Recent Labs  Lab 11/16/17 0503  LIPASE 21   No results for input(s): AMMONIA in the last 168 hours. Coagulation Profile: No results for input(s): INR, PROTIME in the last 168 hours. Cardiac Enzymes: No results for input(s): CKTOTAL, CKMB, CKMBINDEX, TROPONINI in the last 168 hours. BNP (last 3 results) No results for input(s): PROBNP in the last 8760 hours. HbA1C: No results for input(s): HGBA1C in the last 72 hours. CBG: Recent Labs  Lab 11/19/17 0801 11/19/17 1122 11/19/17 1641 11/19/17 2001 11/19/17 2358  GLUCAP 116* 131* 84 111* 111*   Lipid Profile: No results for input(s): CHOL, HDL, LDLCALC, TRIG,  CHOLHDL, LDLDIRECT in the last 72 hours. Thyroid Function Tests: No results for input(s): TSH, T4TOTAL, FREET4, T3FREE, THYROIDAB in the last 72 hours. Anemia Panel: No results for input(s): VITAMINB12, FOLATE, FERRITIN, TIBC, IRON, RETICCTPCT in the last 72 hours. Sepsis Labs: Recent Labs  Lab 11/15/17 0355 11/16/17 0503 11/17/17 0318  PROCALCITON 0.16 0.17 0.11    Recent Results (from the past 240 hour(s))  Culture, respiratory (NON-Expectorated)     Status: None   Collection Time: 11/15/17 10:26 AM  Result Value Ref Range Status   Specimen Description   Final    TRACHEAL ASPIRATE Performed at [facility] [address], [place], [place] [address]    Special Requests   Final    NONE Performed at [facility], [place] [address], [place], [place] [address]    Gram Stain   Final    FEW  WBC PRESENT,BOTH PMN AND MONONUCLEAR RARE GRAM VARIABLE ROD Performed at [facility], [place] [address] [place], [place] [address]    Culture FEW CANDIDA DUBLINIENSIS  Final   Report Status 11/18/2017 FINAL  Final     Radiology Studies: Dg Abd 1 View  Result Date: 11/19/2017 CLINICAL DATA:  62 y/o  F; NG tube placement. EXAM: ABDOMEN - 1 VIEW COMPARISON:  11/19/2017 abdomen radiograph. FINDINGS: Normal bowel gas pattern. Enteric tube tip projects over gastric body. Mild levocurvature of the thoracolumbar junction. Bones are otherwise unremarkable. Surgical clips project over left hemiabdomen IMPRESSION: Enteric tube tip projects over gastric body. Electronically Signed   By: Kristine Garbe M.D.   On: 11/19/2017 22:22   Dg Abd 1 View  Result Date: 11/19/2017 CLINICAL DATA:  Patient pulled NG tube, NG tube placement EXAM: ABDOMEN - 1 VIEW COMPARISON:  11/19/2017 at 1312 hours FINDINGS: Nasogastric tube passes below the diaphragm to lie within the mid stomach. Multiple gallstones and right upper quadrant. Bowel gas pattern is unremarkable.  IMPRESSION: Well-positioned nasogastric tube. Electronically Signed   By: Lajean Manes M.D.   On: 11/19/2017 16:47   Dg Abd 1 View  Result Date: 11/19/2017 CLINICAL DATA:  Check nasogastric catheter placement EXAM: ABDOMEN - 1 VIEW COMPARISON:  11/19/2017 FINDINGS: Nasogastric catheter is again coiled upon itself but appears to be at least partially across the gastroesophageal junction. This should be advanced several cm. Multiple gallstones are again seen. Scattered nonobstructive bowel gas pattern is noted. IMPRESSION: Nasogastric catheter folded upon itself distally but appears to be at least partially in the stomach. This should be advanced several cm. Electronically Signed   By: Inez Catalina M.D.   On: 11/19/2017 14:05   Dg Abd 1 View  Result Date: 11/19/2017 CLINICAL DATA:  Evaluate nasogastric tube placement. EXAM: ABDOMEN - 1 VIEW COMPARISON:  11/18/2017.  CT 11/07/2017 FINDINGS: The nasogastric tube appears to be coiled up near the GE junction. The tube has been pulled back since the previous examination. Nonobstructive bowel gas pattern with gas in the transverse colon. IMPRESSION: Nasogastric tube has been pulled back compared to the previous examination. The tube appears to be coiled up near the GE junction. Electronically Signed   By: Markus Daft M.D.   On: 11/19/2017 10:22   Dg Abd 1 View  Result Date: 11/18/2017 CLINICAL DATA:  NG tube placement EXAM: ABDOMEN - 1 VIEW COMPARISON:  11/18/2017 FINDINGS: Esophageal tube tip overlies the expected location of gastric body. Right upper quadrant calcifications corresponding to gallstones. Visible upper gas pattern nonobstructed. IMPRESSION: Esophageal tube tip overlies expected location of gastric body. Electronically Signed   By: Donavan Foil M.D.   On: 11/18/2017 20:56   Dg Abd 1 View  Result Date: 11/18/2017 CLINICAL DATA:  Encounter for nasogastric tube placement. EXAM: ABDOMEN - 1 VIEW COMPARISON:  Radiograph of November 18, 2017. FINDINGS: The  bowel gas pattern is normal. Distal tip of nasogastric tube is seen in expected position of proximal stomach. Surgical clips are noted in the abdomen. Cholelithiasis is noted. IMPRESSION: Distal tip of nasogastric tube seen in expected position of proximal stomach. No evidence of bowel obstruction or ileus. Electronically Signed   By: Marijo Conception, M.D.   On: 11/18/2017 12:19   Dg Abd 1 View  Result Date: 11/18/2017 CLINICAL DATA:  Nasogastric tube placement EXAM: ABDOMEN - 1 VIEW COMPARISON:  Portable exam 1013 hours compared to 11/17/2017 FINDINGS: Tip of nasogastric tube projects over the distal  esophagus, recommend advancing tube 12 cm into stomach. Bowel gas pattern normal. Calcified gallstones in  RIGHT upper quadrant. LEFT mid abdomen surgical clips. No acute osseous findings. IMPRESSION: Tip of nasogastric tube is at the distal esophagus; recommend advancing tube 12 cm. Cholelithiasis. Electronically Signed   By: Lavonia Dana M.D.   On: 11/18/2017 10:53   Dg Chest Port 1 View  Result Date: 11/18/2017 CLINICAL DATA:  Possible UTI. EXAM: PORTABLE CHEST 1 VIEW COMPARISON:  11/15/2017. FINDINGS: Tracheostomy tube noted with tip over the trachea. Interval removal of NG tube. Cardiomegaly with normal pulmonary vascularity. No focal infiltrate. No pleural effusion or pneumothorax. Degenerative change thoracic spine. IMPRESSION: 1. Interval removal of NG tube. Tracheostomy tube noted with tip over the trachea. 2. Cardiomegaly with normal pulmonary vascularity. No acute pulmonary disease noted. Electronically Signed   By: Marcello Moores  Register   On: 11/18/2017 09:41    Scheduled Meds: . amLODipine  5 mg Per Tube Daily  . atorvastatin  30 mg Per Tube QHS  . bisacodyl  10 mg Rectal Daily  . chlorhexidine gluconate (MEDLINE KIT)  15 mL Mouth Rinse BID  . Chlorhexidine Gluconate Cloth  6 each Topical Q0600  . cloNIDine  0.2 mg Per Tube TID  . clopidogrel  75 mg Per Tube Daily  . enoxaparin (LOVENOX)  injection  40 mg Subcutaneous Q24H  . free water  100 mL Per Tube Q8H  . hydrALAZINE  100 mg Oral Q8H  . insulin aspart  0-15 Units Subcutaneous Q4H  . labetalol  200 mg Per Tube TID  . mouth rinse  15 mL Mouth Rinse QID  . metoCLOPramide (REGLAN) injection  5 mg Intravenous Q8H  . mupirocin ointment  1 application Nasal BID  . pantoprazole sodium  40 mg Per Tube Q1200  . polyethylene glycol  17 g Oral Daily  . senna-docusate  1 tablet Per Tube BID  . sodium chloride flush  3 mL Intravenous Q12H   Continuous Infusions: . sodium chloride 10 mL/hr at 11/19/17 0400  . feeding supplement (OSMOLITE 1.2 CAL) 40 mL/hr at 11/20/17 0840     LOS: 13 days    Time spent: Total of 25 minutes spent with pt, greater than 50% of which was spent in discussion of  treatment, counseling and coordination of care  Chipper Oman, MD Pager: Text Page via [email or web address]   If 7PM-7AM, please contact night-coverage [email or web address] 11/20/2017, 8:47 AM   Note - This record has been created using [employer]. Chart creation errors have been sought, but may not always have been located. Such creation errors do not reflect on the standard of medical care.

## 2017-11-21 LAB — GLUCOSE, CAPILLARY
GLUCOSE-CAPILLARY: 183 mg/dL — AB (ref 65–99)
Glucose-Capillary: 226 mg/dL — ABNORMAL HIGH (ref 65–99)
Glucose-Capillary: 172 mg/dL — ABNORMAL HIGH (ref 65–99)
Glucose-Capillary: 188 mg/dL — ABNORMAL HIGH (ref 65–99)
Glucose-Capillary: 232 mg/dL — ABNORMAL HIGH (ref 65–99)
Glucose-Capillary: 184 mg/dL — ABNORMAL HIGH (ref 65–99)

## 2017-11-21 MED ORDER — AMLODIPINE BESYLATE 10 MG PO TABS
10.0000 mg | ORAL_TABLET | Freq: Every day | ORAL | Status: DC
  Administered 2017-11-22 – 2017-11-25 (×4): 10 mg
  Filled 2017-11-21 (×4): qty 1

## 2017-11-21 MED ORDER — INSULIN GLARGINE 100 UNIT/ML ~~LOC~~ SOLN
10.0000 [IU] | Freq: Every day | SUBCUTANEOUS | Status: DC
  Administered 2017-11-21 – 2017-11-22 (×2): 10 [IU] via SUBCUTANEOUS
  Filled 2017-11-21 (×2): qty 0.1

## 2017-11-21 NOTE — Progress Notes (Addendum)
PROGRESS NOTE [facility]   [place]   DXA:[id number] DOB: April 13, 1956  DOA: 11/07/2017 PCP: Earlyne Iba, MD   Brief Narrative:  Grace [name] 62 year old female with past medical history of CVA with left hemiparesis, hypertension and CAD who presented to the emergency department with fevers and elevated blood pressure.  She was admitted on 3/24 after being found encephalopathic and septic due to UTI.  On 3/25 she had acute changes on mental status and PCCM was consulted. Patient was intubated for airway protection and worsening HCRF, patient self extubated on 3/26, failed with continued hypoxia and was re-intubated due to ongoing AMS.  Patient underwent trach 3/29.  Patient blood pressure has improved.  Patient completed treatment for UTI.  Patient developed possible ileus due to poor mobility, which now has resolved.  Patient now on trach collar. Nutrition via NGT. SLP working on [employer] and voice.   Subjective: Patient doing okay, having a lot of secretions.  No complaints today  Assessment & Plan: Acute metabolic encephalopathy felt to be multifactorial from hypertensive encephalopathy (PRES), infectious process and hypercarbia. Improved Treating underlying causes  Acute respiratory failure with hypoxia and hypercarbia in setting of acute encephalopathy Status post trach/vent dependent -now off the vent for over 48 hours, hopefully can decrease trach size in a.m. and continue speech therapy for PMSV and swallow assessment Continue oxygen supplementation through trach collar only.  Management per pulmonary/critical care Out of bed as tolerated Check chest x-ray  Sepsis E. coli UTI - resolved Completed treatment, initially treated with vancomycin and Zosyn subsequently changed to Rocephin x 5 days last day of treatment was 4/2.  Continue to monitor Sepsis physiology has resolved.  Remains afebrile  Hypertensive urgency/Elevated troponin In setting of  sepsis. BP stable but above goal Troponin elevation due to demand ischemia from sepsis and hypertensive episode Echocardiogram LVEF 55-60% and grade 1 diastolic dysfunction no wall motion abnormalities No ischemic workup indicated Continue hydralazine 100 mg every 8 hours, labetalol 200 mg 3 times a day  clonidine to 0.2 mg and increase Norvasc to 10 mg daily.  Continue hydralazine IV as needed Continue to monitor  Nausea and vomiting felt to be secondary to ileus - resolved  Continue MiraLAX, Senokot Mobilize as able, patient at baseline non-mobile Continue to monitor  Vulvovaginal candidiasis Completed treatment with Diflucan  Anemia of chronic disease Hemoglobin stable.  No signs of overt bleeding Check CBC in a.m.  Diabetes mellitus type II A1c 6.7 CBGs now starting to trend up with feedings will add Lantus 10 units and continue sliding scale Continue to adjust Lantus as needed  Chronic diastolic CHF  Compensated  Monitor   Severe deconditioning PT as tolerated  DVT prophylaxis: Lovenox Code Status: Full code Family Communication: None at bedside Disposition Plan: SNF when able to wean off the vent, she will stay of the vent for this weekend and if she does well trach will be downsized.  Will continue working on nutrition and physical strengthening  Consultants:   PCCM  Procedures:   ETT 3/25>> 3/29  Trach 3/29  Antimicrobials:  Vancomycin 3/24>>> 3/27  Zosyn 3/24>>> 3/27   Rocephin 3/27>>> 4/2   Objective: Vitals:   11/21/17 0440 11/21/17 0800 11/21/17 0805 11/21/17 0918  BP:    (!) 159/69  Pulse: 86  85   Resp: (!) 28  (!) 25   Temp:  99 F (37.2 C)    TempSrc:  Oral    SpO2: 100%  99%  Weight:      Height:        Intake/Output Summary (Last 24 hours) at 11/21/2017 0948 Last data filed at 11/21/2017 0421 Gross per 24 hour  Intake 1816 ml  Output 1000 ml  Net 816 ml   Filed Weights   11/17/17 0326 11/19/17 0500 11/21/17 0427  Weight:  87.8 kg (193 lb 9 oz) 88.4 kg (194 lb 14.2 oz) 84.9 kg (187 lb 2.7 oz)    Examination:   General: NAD, alert and awake Cardiovascular: RRR, S1/S2 + Respiratory: Moving air well, bronchial/rhonchi diffuse.  No wheezing Abdominal: Soft, NT, ND Extremities: no edema  Data Reviewed: I have personally reviewed following labs and imaging studies  CBC: Recent Labs  Lab 11/15/17 0355 11/16/17 0503 11/18/17 0946 11/19/17 0314 11/20/17 0324  WBC 11.2* 10.9* 10.9* 10.2 9.6  NEUTROABS  --   --  6.7 6.2  --   HGB 10.4* 10.3* 8.2* 8.7* 8.9*  HCT 33.4* 33.3* 26.7* 28.6* 30.0*  MCV 90.3 90.0 90.5 91.1 92.0  PLT 408* 462* 423* 459* 982*   Basic Metabolic Panel: Recent Labs  Lab 11/15/17 0355 11/16/17 0503 11/17/17 0318 11/18/17 0946 11/19/17 0314  NA 135 138 139 142 141  K 4.7 4.3 3.7 3.8 3.6  CL 91* 95* 96* 101 103  CO2 33* 33* 33* 31 29  GLUCOSE 222* 197* 145* 110* 145*  BUN 26* 26* 28* 24* 22*  CREATININE 0.80 0.76 0.81 0.86 0.83  CALCIUM 9.5 10.0 9.6 9.6 9.6  MG 1.9  --   --   --   --   PHOS 2.9  --   --   --   --    GFR: Estimated Creatinine Clearance: 73.2 mL/min (by C-G formula based on SCr of 0.83 mg/dL). Liver Function Tests: Recent Labs  Lab 11/16/17 0503 11/18/17 0946  AST 19 16  ALT 14 12*  ALKPHOS 55 47  BILITOT 0.5 0.7  PROT 7.8 6.9  ALBUMIN 3.1* 2.7*   Recent Labs  Lab 11/16/17 0503  LIPASE 21   No results for input(s): AMMONIA in the last 168 hours. Coagulation Profile: No results for input(s): INR, PROTIME in the last 168 hours. Cardiac Enzymes: No results for input(s): CKTOTAL, CKMB, CKMBINDEX, TROPONINI in the last 168 hours. BNP (last 3 results) No results for input(s): PROBNP in the last 8760 hours. HbA1C: No results for input(s): HGBA1C in the last 72 hours. CBG: Recent Labs  Lab 11/20/17 1630 11/20/17 1944 11/20/17 2339 11/21/17 0342 11/21/17 0732  GLUCAP 175* 161* 170* 183* 226*   Lipid Profile: No results for input(s):  CHOL, HDL, LDLCALC, TRIG, CHOLHDL, LDLDIRECT in the last 72 hours. Thyroid Function Tests: No results for input(s): TSH, T4TOTAL, FREET4, T3FREE, THYROIDAB in the last 72 hours. Anemia Panel: No results for input(s): VITAMINB12, FOLATE, FERRITIN, TIBC, IRON, RETICCTPCT in the last 72 hours. Sepsis Labs: Recent Labs  Lab 11/15/17 0355 11/16/17 0503 11/17/17 0318  PROCALCITON 0.16 0.17 0.11    Recent Results (from the past 240 hour(s))  Culture, respiratory (NON-Expectorated)     Status: None   Collection Time: 11/15/17 10:26 AM  Result Value Ref Range Status   Specimen Description   Final    TRACHEAL ASPIRATE Performed at [facility] [address], [place], [place] [address]    Special Requests   Final    NONE Performed at [facility], [place] [address], [place], [place] [address]    Gram Stain  Final    FEW WBC PRESENT,BOTH PMN AND MONONUCLEAR RARE GRAM VARIABLE ROD Performed at [facility], [place] [address] [place], [place] [address]    Culture FEW CANDIDA DUBLINIENSIS  Final   Report Status 11/18/2017 FINAL  Final     Radiology Studies: Dg Abd 1 View  Result Date: 11/19/2017 CLINICAL DATA:  62 y/o  F; NG tube placement. EXAM: ABDOMEN - 1 VIEW COMPARISON:  11/19/2017 abdomen radiograph. FINDINGS: Normal bowel gas pattern. Enteric tube tip projects over gastric body. Mild levocurvature of the thoracolumbar junction. Bones are otherwise unremarkable. Surgical clips project over left hemiabdomen IMPRESSION: Enteric tube tip projects over gastric body. Electronically Signed   By: Kristine Garbe M.D.   On: 11/19/2017 22:22   Dg Abd 1 View  Result Date: 11/19/2017 CLINICAL DATA:  Patient pulled NG tube, NG tube placement EXAM: ABDOMEN - 1 VIEW COMPARISON:  11/19/2017 at 1312 hours FINDINGS: Nasogastric tube passes below the diaphragm to lie within the mid stomach. Multiple gallstones and right upper quadrant. Bowel gas pattern  is unremarkable. IMPRESSION: Well-positioned nasogastric tube. Electronically Signed   By: Lajean Manes M.D.   On: 11/19/2017 16:47   Dg Abd 1 View  Result Date: 11/19/2017 CLINICAL DATA:  Check nasogastric catheter placement EXAM: ABDOMEN - 1 VIEW COMPARISON:  11/19/2017 FINDINGS: Nasogastric catheter is again coiled upon itself but appears to be at least partially across the gastroesophageal junction. This should be advanced several cm. Multiple gallstones are again seen. Scattered nonobstructive bowel gas pattern is noted. IMPRESSION: Nasogastric catheter folded upon itself distally but appears to be at least partially in the stomach. This should be advanced several cm. Electronically Signed   By: Inez Catalina M.D.   On: 11/19/2017 14:05   Dg Abd 1 View  Result Date: 11/19/2017 CLINICAL DATA:  Evaluate nasogastric tube placement. EXAM: ABDOMEN - 1 VIEW COMPARISON:  11/18/2017.  CT 11/07/2017 FINDINGS: The nasogastric tube appears to be coiled up near the GE junction. The tube has been pulled back since the previous examination. Nonobstructive bowel gas pattern with gas in the transverse colon. IMPRESSION: Nasogastric tube has been pulled back compared to the previous examination. The tube appears to be coiled up near the GE junction. Electronically Signed   By: Markus Daft M.D.   On: 11/19/2017 10:22    Scheduled Meds: . amLODipine  5 mg Per Tube Daily  . atorvastatin  30 mg Per Tube QHS  . bisacodyl  10 mg Rectal Daily  . chlorhexidine gluconate (MEDLINE KIT)  15 mL Mouth Rinse BID  . cloNIDine  0.2 mg Per Tube TID  . clopidogrel  75 mg Per Tube Daily  . enoxaparin (LOVENOX) injection  40 mg Subcutaneous Q24H  . free water  100 mL Per Tube Q8H  . hydrALAZINE  100 mg Oral Q8H  . insulin aspart  0-15 Units Subcutaneous Q4H  . labetalol  200 mg Per Tube TID  . mouth rinse  15 mL Mouth Rinse QID  . metoCLOPramide (REGLAN) injection  5 mg Intravenous Q8H  . mupirocin ointment  1 application  Nasal BID  . pantoprazole sodium  40 mg Per Tube Q1200  . polyethylene glycol  17 g Oral Daily  . senna-docusate  1 tablet Per Tube BID  . sodium chloride flush  3 mL Intravenous Q12H   Continuous Infusions: . sodium chloride 10 mL/hr at 11/19/17 0400  . feeding supplement (OSMOLITE 1.2 CAL) 60 mL/hr at 11/21/17 0401  LOS: 14 days    Time spent: Total of 25 minutes spent with pt, greater than 50% of which was spent in discussion of  treatment, counseling and coordination of care  Chipper Oman, MD Pager: Text Page via [email or web address]   If 7PM-7AM, please contact night-coverage [email or web address] 11/21/2017, 9:48 AM   Note - This record has been created using [employer]. Chart creation errors have been sought, but may not always have been located. Such creation errors do not reflect on the standard of medical care.

## 2017-11-22 LAB — GLUCOSE, CAPILLARY
GLUCOSE-CAPILLARY: 192 mg/dL — AB (ref 65–99)
GLUCOSE-CAPILLARY: 203 mg/dL — AB (ref 65–99)
GLUCOSE-CAPILLARY: 200 mg/dL — AB (ref 65–99)
GLUCOSE-CAPILLARY: 184 mg/dL — AB (ref 65–99)
Glucose-Capillary: 211 mg/dL — ABNORMAL HIGH (ref 65–99)
Glucose-Capillary: 206 mg/dL — ABNORMAL HIGH (ref 65–99)

## 2017-11-22 MED ORDER — LABETALOL HCL 5 MG/ML IV SOLN
10.0000 mg | INTRAVENOUS | Status: DC | PRN
  Filled 2017-11-22: qty 4

## 2017-11-22 MED ORDER — INSULIN GLARGINE 100 UNIT/ML ~~LOC~~ SOLN
20.0000 [IU] | Freq: Every day | SUBCUTANEOUS | Status: DC
  Administered 2017-11-23 – 2017-11-24 (×2): 20 [IU] via SUBCUTANEOUS
  Filled 2017-11-22 (×2): qty 0.2

## 2017-11-22 MED ORDER — HYDRALAZINE HCL 20 MG/ML IJ SOLN: 10.0000 mg | INTRAMUSCULAR | Status: DC | PRN

## 2017-11-22 NOTE — Progress Notes (Signed)
PROGRESS NOTE [facility]   [place]   HQP:[id number] DOB: [birth year]  DOA: 11/07/2017 PCP: Earlyne Iba, MD   Brief Narrative:  Grace [name] 62 year old female with past medical history of CVA with left hemiparesis, hypertension and CAD who presented to the emergency department with fevers and elevated blood pressure.  She was admitted on 3/24 after being found encephalopathic and septic due to UTI.  On 3/25 she had acute changes on mental status and PCCM was consulted. Patient was intubated for airway protection and worsening HCRF, patient self extubated on 3/26, failed with continued hypoxia and was re-intubated due to ongoing AMS.  Patient underwent trach 3/29, ventilator was weaned slowly.  Patient blood pressure has improved.  Patient completed treatment for UTI.  Patient developed possible ileus due to poor mobility, which now has resolved.  Patient now on trach collar. Nutrition via NGT. SLP working on [employer] and voice.   Subjective: Patient seen and examined doing well, remains afebrile, c/o itchiness this am. No acute events overnight, remains off vent since 4/5 and tolerating well.   Assessment & Plan: Acute metabolic encephalopathy felt to be multifactorial from hypertensive encephalopathy (PRES), infectious process and hypercarbia. Resolved  Treating underlying causes  Acute respiratory failure with hypoxia and hypercarbia in setting of acute encephalopathy Status post trach/vent dependent - slow weaned off the vent, trach has ben changed to # cuffless 4/8, tolerated well. Continue supportive O2 supplement. Speech to work on [place] and swallow eval. Continue nutrition via NGT for now. Continue management per PCCM. Continue pulmonary toilet.  Check chest x-ray.   Sepsis E. coli UTI - resolved Completed treatment, initially treated with vancomycin and Zosyn subsequently changed to Rocephin x 5 days last day of treatment was 4/2.  Remains afebrile,  sepsis physiology has resolved.  Hypertensive urgency/Elevated troponin - stable In setting of sepsis. BP significantly improved on current regimen Troponin elevation due to demand ischemia from sepsis and hypertensive episode Echocardiogram LVEF 55-60% and grade 1 diastolic dysfunction no wall motion abnormalities No ischemic workup indicated Continue hydralazine 100 mg every 8 hours, labetalol 200 mg 3 times a day  clonidine to 0.2 mg and increase Norvasc to 10 mg daily.  Continue hydralazine IV as needed Continue to monitor  Nausea and vomiting felt to be secondary to ileus - resolved  Continue MiraLAX, Senokot Mobilize as able, patient at baseline non-mobile Continue to monitor  Vulvovaginal candidiasis Completed treatment with Diflucan  Anemia of chronic disease Hemoglobin stable.  No signs of overt bleeding Will check CBC 4/9  Diabetes mellitus type II A1c 6.7 CBG slightly above goal, Lantus was started at 10 units with increased to 20 and continue adjusting pending 24 hours of insulin requirement.  Continue sliding scale  Chronic diastolic CHF  Seems to be compensated at this time Monitor for signs of fluid overload  Severe deconditioning PT as tolerated  DVT prophylaxis: Lovenox Code Status: Full code Family Communication: None at bedside Disposition Plan: SNF pending nutrition status and speech therapy.  Currently 3-4 more days.  Consultants:   PCCM  Procedures:   ETT 3/25>> 3/29  Trach 3/29  Antimicrobials:  Vancomycin 3/24>>> 3/27  Zosyn 3/24>>> 3/27   Rocephin 3/27>>> 4/2   Objective: Vitals:   11/22/17 0800 11/22/17 0833 11/22/17 1140   BP: (!) 146/57 (!) 146/57 (!) 146/55   Pulse:  81 86   Resp:  (!) 22 (!) 100   Temp: 99.1 F (37.3 C)     TempSrc: Oral  SpO2: 96% 98%    Weight:      Height:        Intake/Output Summary (Last 24 hours) at 11/22/2017 1253 Last data filed at 11/22/2017 0930 Gross per 24 hour  Intake 1930 ml    Output 350 ml  Net 1580 ml   Filed Weights   11/21/17 0427 11/22/17 0205 11/22/17 0441  Weight: 84.9 kg (187 lb 2.7 oz) 84.9 kg (187 lb 2.7 oz) 84.9 kg (187 lb 2.7 oz)    Examination:   General: NAD, Cardiovascular: RRR, S1/S2  Respiratory: Good air entry, bronchial sounds, no wheezing or crackles Abdominal: Soft, NT, ND, Extremities: No lower extremity edema, dry skin   Data Reviewed: I have personally reviewed following labs and imaging studies  CBC: Recent Labs  Lab 11/16/17 0503 11/18/17 0946 11/19/17 0314 11/20/17 0324  WBC 10.9* 10.9* 10.2 9.6  NEUTROABS  --  6.7 6.2  --   HGB 10.3* 8.2* 8.7* 8.9*  HCT 33.3* 26.7* 28.6* 30.0*  MCV 90.0 90.5 91.1 92.0  PLT 462* 423* 459* 680*   Basic Metabolic Panel: Recent Labs  Lab 11/16/17 0503 11/17/17 0318 11/18/17 0946 11/19/17 0314  NA 138 139 142 141  K 4.3 3.7 3.8 3.6  CL 95* 96* 101 103  CO2 33* 33* 31 29  GLUCOSE 197* 145* 110* 145*  BUN 26* 28* 24* 22*  CREATININE 0.76 0.81 0.86 0.83  CALCIUM 10.0 9.6 9.6 9.6   GFR: Estimated Creatinine Clearance: 73.2 mL/min (by C-G formula based on SCr of 0.83 mg/dL). Liver Function Tests: Recent Labs  Lab 11/16/17 0503 11/18/17 0946  AST 19 16  ALT 14 12*  ALKPHOS 55 47  BILITOT 0.5 0.7  PROT 7.8 6.9  ALBUMIN 3.1* 2.7*   Recent Labs  Lab 11/16/17 0503  LIPASE 21   No results for input(s): AMMONIA in the last 168 hours. Coagulation Profile: No results for input(s): INR, PROTIME in the last 168 hours. Cardiac Enzymes: No results for input(s): CKTOTAL, CKMB, CKMBINDEX, TROPONINI in the last 168 hours. BNP (last 3 results) No results for input(s): PROBNP in the last 8760 hours. HbA1C: No results for input(s): HGBA1C in the last 72 hours. CBG: Recent Labs  Lab 11/21/17 1618 11/21/17 1937 11/21/17 2301 11/22/17 0341 11/22/17 0747  GLUCAP 188* 232* 184* 192* 211*   Lipid Profile: No results for input(s): CHOL, HDL, LDLCALC, TRIG, CHOLHDL, LDLDIRECT  in the last 72 hours. Thyroid Function Tests: No results for input(s): TSH, T4TOTAL, FREET4, T3FREE, THYROIDAB in the last 72 hours. Anemia Panel: No results for input(s): VITAMINB12, FOLATE, FERRITIN, TIBC, IRON, RETICCTPCT in the last 72 hours. Sepsis Labs: Recent Labs  Lab 11/16/17 0503 11/17/17 0318  PROCALCITON 0.17 0.11    Recent Results (from the past 240 hour(s))  Culture, respiratory (NON-Expectorated)     Status: None   Collection Time: 11/15/17 10:26 AM  Result Value Ref Range Status   Specimen Description   Final    TRACHEAL ASPIRATE Performed at [facility] [address], [place], [place] [address]    Special Requests   Final    NONE Performed at [facility], [place] [address] [place], [place] [address]    Gram Stain   Final    FEW WBC PRESENT,BOTH PMN AND MONONUCLEAR RARE GRAM VARIABLE ROD Performed at [facility], [place] [address], [place], [place] [address]    Culture FEW CANDIDA DUBLINIENSIS  Final   Report Status 11/18/2017 FINAL  Final     Radiology Studies: No results found.  Scheduled Meds: . amLODipine  10 mg Per Tube Daily  . atorvastatin  30 mg Per Tube QHS  . bisacodyl  10 mg Rectal Daily  . chlorhexidine gluconate (MEDLINE KIT)  15 mL Mouth Rinse BID  . cloNIDine  0.2 mg Per Tube TID  . clopidogrel  75 mg Per Tube Daily  . enoxaparin (LOVENOX) injection  40 mg Subcutaneous Q24H  . free water  100 mL Per Tube Q8H  . hydrALAZINE  100 mg Oral Q8H  . insulin aspart  0-15 Units Subcutaneous Q4H  . insulin glargine  10 Units Subcutaneous Daily  . labetalol  200 mg Per Tube TID  . mouth rinse  15 mL Mouth Rinse QID  . metoCLOPramide (REGLAN) injection  5 mg Intravenous Q8H  . mupirocin ointment  1 application Nasal BID  . pantoprazole sodium  40 mg Per Tube Q1200  . polyethylene glycol  17 g Oral Daily  . senna-docusate  1 tablet Per Tube BID  . sodium chloride flush  3 mL Intravenous Q12H    Continuous Infusions: . sodium chloride 10 mL/hr at 11/22/17 0300  . feeding supplement (OSMOLITE 1.2 CAL) 60 mL/hr at 11/22/17 0800     LOS: 15 days    Time spent: Total of 25 minutes spent with pt, greater than 50% of which was spent in discussion of  treatment, counseling and coordination of care  Chipper Oman, MD Pager: Text Page via [email or web address]   If 7PM-7AM, please contact night-coverage [email or web address] 11/22/2017, 12:53 PM   Note - This record has been created using [employer]. Chart creation errors have been sought, but may not always have been located. Such creation errors do not reflect on the standard of medical care.
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
## 2017-11-22 NOTE — Progress Notes (Signed)
Pt's #6 cuffed trach changed to a #6 uncuffed per protocol. Pt had a small amount of secretions and her vitals were stable, positive CO2  Color change after #6 cuffless was placed. RT will continue to monitor

## 2017-11-22 NOTE — Progress Notes (Signed)
  Speech Language Pathology Treatment: [name] Speaking valve  Patient Details Name: Jacalyn LefevreGwendolyn Tipler MRN: [id number] DOB: December 06, 1955 Today's Date: 11/22/2017 Time: 1530-1600 SLP Time Calculation (min) (ACUTE ONLY): 30 min  Assessment / Plan / Recommendation Clinical Impression  SLP trial for PMSV to take place = pt's valve was not on her new trach.  Located the valve attached to her old trach and in the trash. Respiratory made aware of this finding.  Obtained a new PMSV from our dept for replacement.    Today pt is lethargic and will awaken momentarily to open eyes, find items on communication board but does not maintain attention.  Left communication board for immediate needs with pt and she was able to correctly identify items from narrowed choice of 4.    Congested breathing noted - pt able to phonate with weak, dysphonic voice "ah" with max verbal/visual cues.   SLP did not place PMSV due to pt's lethargy and gross weakness.  Of note, pt was fully alert Wednesday April 3rd during SLP evaluation thus she appears different than last week. RN reports pt did not receive sedating medications.  Pt admits she is tired.  Note pt with temperature of 100.6.    Pt appears with white coating on tongue that is concerning for possible oral candidiasis. RN made aware following evaluation.    Will follow up for PMSV tolerance and indication for swallow evaluation.    HPI HPI: Jacalyn LefevreGwendolyn Rotert 62 year old female with past medical history of CVA with left hemiparesis, hypertension and CAD who presented to the emergency department with fevers and elevated blood pressure.  She was admitted on 3/24 after being found encephalopathic and septic due to UTI.  On 3/25 she had acute changes on mental status and PCCM was consulted. Patient was intubated for airway protection and worsening HCRF, patient self extubated on 3/26, failed with continued hypoxia and was remains intubated due to ongoing AMS.  Patient  underwent trach 3/29.  Patient completed treatment for UTI per MD notes.  Patient developed possible ileus due to poor mobility.  Patient now on trach support with vent at night.  PMSV ordered.        SLP Plan  Continue with current plan of care       Recommendations         Patient may use Passy-Muir Speech Valve: with SLP only PMSV Supervision: Full         Oral Care Recommendations: Oral care QID Follow up Recommendations: Skilled Nursing facility SLP Visit Diagnosis: Aphonia (R49.1) Plan: Continue with current plan of care       GO                [name], Saiya Crist [name] 11/22/2017, 4:24 PM Donavan Burnetamara Anniemae Haberkorn, MS [facility] SLP [phone]

## 2017-11-23 ENCOUNTER — Inpatient Hospital Stay (HOSPITAL_COMMUNITY): Payer: Medicaid Other

## 2017-11-23 DIAGNOSIS — R509 Fever, unspecified: Secondary | ICD-10-CM

## 2017-11-23 LAB — URINALYSIS, ROUTINE W REFLEX MICROSCOPIC
BILIRUBIN URINE: NEGATIVE
Glucose, UA: 50 mg/dL — AB
HGB URINE DIPSTICK: NEGATIVE
Ketones, ur: NEGATIVE mg/dL
Leukocytes, UA: NEGATIVE
NITRITE: NEGATIVE
PROTEIN: NEGATIVE mg/dL
SPECIFIC GRAVITY, URINE: 1.018 (ref 1.005–1.030)
pH: 6 (ref 5.0–8.0)

## 2017-11-23 LAB — GLUCOSE, CAPILLARY
GLUCOSE-CAPILLARY: 222 mg/dL — AB (ref 65–99)
GLUCOSE-CAPILLARY: 257 mg/dL — AB (ref 65–99)
GLUCOSE-CAPILLARY: 227 mg/dL — AB (ref 65–99)
GLUCOSE-CAPILLARY: 181 mg/dL — AB (ref 65–99)
Glucose-Capillary: 210 mg/dL — ABNORMAL HIGH (ref 65–99)
Glucose-Capillary: 214 mg/dL — ABNORMAL HIGH (ref 65–99)

## 2017-11-23 LAB — CBC
HEMATOCRIT: 30.1 % — AB (ref 36.0–46.0)
Hemoglobin: 9.2 g/dL — ABNORMAL LOW (ref 12.0–15.0)
MCH: 27.4 pg (ref 26.0–34.0)
MCHC: 30.6 g/dL (ref 30.0–36.0)
MCV: 89.6 fL (ref 78.0–100.0)
PLATELETS: 563 10*3/uL — AB (ref 150–400)
RBC: 3.36 MIL/uL — AB (ref 3.87–5.11)
RDW: 14.9 % (ref 11.5–15.5)
WBC: 10.9 10*3/uL — AB (ref 4.0–10.5)

## 2017-11-23 LAB — BLOOD GAS, ARTERIAL
Acid-Base Excess: 7 mmol/L — ABNORMAL HIGH (ref 0.0–2.0)
Bicarbonate: 31.4 mmol/L — ABNORMAL HIGH (ref 20.0–28.0)
FIO2: 28
O2 SAT: 96.1 %
PATIENT TEMPERATURE: 98.6
pCO2 arterial: 46 mmHg (ref 32.0–48.0)
pH, Arterial: 7.449 (ref 7.350–7.450)
pO2, Arterial: 83.2 mmHg (ref 83.0–108.0)

## 2017-11-23 LAB — BASIC METABOLIC PANEL
Anion gap: 10 (ref 5–15)
BUN: 12 mg/dL (ref 6–20)
CHLORIDE: 102 mmol/L (ref 101–111)
CO2: 27 mmol/L (ref 22–32)
CREATININE: 0.74 mg/dL (ref 0.44–1.00)
Calcium: 9.3 mg/dL (ref 8.9–10.3)
GFR calc non Af Amer: >60 mL/min (ref >60)
Glucose, Bld: 217 mg/dL — ABNORMAL HIGH (ref 65–99)
POTASSIUM: 4.3 mmol/L (ref 3.5–5.1)
SODIUM: 139 mmol/L (ref 135–145)

## 2017-11-23 LAB — MAGNESIUM: Magnesium: 2 mg/dL (ref 1.7–2.4)

## 2017-11-23 MED ORDER — FUROSEMIDE 10 MG/ML IJ SOLN
60.0000 mg | Freq: Once | INTRAMUSCULAR | Status: AC
  Administered 2017-11-23: 60 mg via INTRAVENOUS
  Filled 2017-11-23: qty 6

## 2017-11-23 MED ORDER — SODIUM CHLORIDE 0.9 % IV SOLN
3.0000 g | Freq: Four times a day (QID) | INTRAVENOUS | Status: DC
  Administered 2017-11-23 – 2017-11-24 (×4): 3 g via INTRAVENOUS
  Filled 2017-11-23 (×5): qty 3

## 2017-11-23 MED ORDER — FLUCONAZOLE IN SODIUM CHLORIDE 200-0.9 MG/100ML-% IV SOLN
200.0000 mg | INTRAVENOUS | Status: AC
  Administered 2017-11-23 – 2017-11-29 (×7): 200 mg via INTRAVENOUS
  Filled 2017-11-23 (×7): qty 100

## 2017-11-23 NOTE — Progress Notes (Signed)
PT Cancellation Note  Patient Details Name: Jacalyn LefevreGwendolyn Hillock MRN: [id number] DOB: [birth year]   Cancelled Treatment:    Reason Eval/Treat Not Completed: PT screened, no needs identified, will sign off, please refer to previous PT notes. Patient  Is at baseline, total care PTA.   Rada HayHill, Evy Lutterman Elizabeth 11/23/2017, 8:47 AM  Blanchard KelchKaren Aivan Fillingim PT [phone]

## 2017-11-23 NOTE — Progress Notes (Signed)
Pharmacy Antibiotic Note  Grace LefevreGwendolyn [name] is a 62 y.o. female admitted on 11/07/2017 with aspiration PNA.  Pharmacy has been consulted for Unasyn dosing.  Plan:  Unasyn 3 gr IV q6h   Monitor clinical course, renal function, cultures as available  Height: 5\' 4"  (162.6 cm) Weight: 187 lb 2.7 oz (84.9 kg) IBW/kg (Calculated) : 54.7  Temp (24hrs), Avg:100.3 F (37.9 C), Min:98.6 F (37 C), Max:102.2 F (39 C)  Recent Labs  Lab 11/17/17 0318 11/18/17 0946 11/19/17 0314 11/20/17 0324 11/23/17 0309  WBC  --  10.9* 10.2 9.6 10.9*  CREATININE 0.81 0.86 0.83  --  0.74    Estimated Creatinine Clearance: 75.9 mL/min (by C-G formula based on SCr of 0.74 mg/dL).    No Known Allergies  Antimicrobials this admission: 3/24 zosyn >> 3/27 3/24 vancomycin >> 3/27 3/27 rocephin >> 4/1 4/1 fluconazole x 3 doses >> 4/3, 4/9 >> 4/9 Unasyn >>    Dose adjustments this admission: ---  Microbiology results: 3/24 BCx x2: neg FINAL 3/25 UCx: >100k E.coli (S rocephin, cefazolin, imip, unasyn, zosyn) FINAL 3/25 MRSA PCR: positive 3/25 HIV Ab: NR 4/1: Tracheal aspirate: few Candida dublinensis 4/9 BCx:  4/9 Urine BCx:   Thank you for allowing pharmacy to be a part of this patient's care.   Adalberto ColeNikola Khary Schaben, PharmD, BCPS Pager [phone] 11/23/2017 4:47 PM

## 2017-11-23 NOTE — Progress Notes (Addendum)
  Speech Language Pathology Treatment: Grace [name] Speaking valve  Patient Details Name: Grace [name] MRN: [id number] DOB: Mar 10, 1956 Today's Date: 11/23/2017 Time: [id number] SLP Time Calculation (min) (ACUTE ONLY): 31 min  Assessment / Plan / Recommendation Clinical Impression  Pt remains sleepy today but agreed to participate in pmsv trial to allow more efficient communication.  Suctioning provided by RN per SLP request.  Pt tolerated valve for a few minutes at a time due to significant breath stacking.  She benefited from max cues to increase effort with phonation.  Pt's reported goal is to "sleep".  MD arrived and advised with look toward pt having a PEG.  After Md left, SLP advised pt to plan and she was agreeable saying "Rather in my stomach than my nose".    All vitals remains stable 4 minutes at a time of valve use - SLP did not leave beyond this time frame to significant breath stacking.  Pt continues with whitish coating on tongue - concerning for oral candidiasis - MD aware.   Recommend continue PMSV for short amounts of time for communicating needs/want with RN supervision.       HPI HPI: Grace [name] 62 year old female with past medical history of CVA with left hemiparesis, hypertension and CAD who presented to the emergency department with fevers and elevated blood pressure.  She was admitted on 3/24 after being found encephalopathic and septic due to UTI.  On 3/25 she had acute changes on mental status and PCCM was consulted. Patient was intubated for airway protection and worsening HCRF, patient self extubated on 3/26, failed with continued hypoxia and was remains intubated due to ongoing AMS.  Patient underwent trach 3/29.  Patient completed treatment for UTI per MD notes.  Patient developed possible ileus due to poor mobility.  Patient now on trach support with vent at night.  PMSV ordered.        SLP Plan  Continue with current plan of care       Recommendations          Patient may use Passy-[name] Speech Valve: with SLP only;Intermittently with supervision(for short amount of time with full supervision) PMSV Supervision: Full         Oral Care Recommendations: Oral care QID Follow up Recommendations: Skilled Nursing facility SLP Visit Diagnosis: Aphonia (R49.1) Plan: Continue with current plan of care       GO                [name], Grace [name] [name] 11/23/2017, 9:35 AM  Donavan Burnetamara Twanda Stakes, MS [facility] SLP [phone]
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
## 2017-11-23 NOTE — Progress Notes (Signed)
PROGRESS NOTE [facility]   [place]   QIW:[id number] DOB: 1955-11-02  DOA: 11/07/2017 PCP: Earlyne Iba, MD   Brief Narrative:  [name] 62 year old female with past medical history of CVA with left hemiparesis, hypertension and CAD who presented to the emergency department with fevers and elevated blood pressure.  She was admitted on 3/24 after being found encephalopathic and septic due to UTI.  On 3/25 she had acute changes on mental status and PCCM was consulted. Patient was intubated for airway protection and worsening HCRF, patient self extubated on 3/26, failed with continued hypoxia and was re-intubated due to ongoing AMS.  Patient underwent trach 3/29, ventilator was weaned slowly.  Patient blood pressure has improved.  Patient completed treatment for UTI.  Patient developed possible ileus due to poor mobility, which now has resolved.  Patient now on trach collar. Nutrition via NGT. SLP working on [employer] and voice.   Subjective: Patient seen and examined, sleepy this morning.  Not very interactive.  A lot of secretions on trach.  Fever this morning.  Remain of dependents.  Assessment & Plan: Acute metabolic encephalopathy felt to be multifactorial from hypertensive encephalopathy (PRES), infectious process and hypercarbia. Sleepy today, ABG has been ordered to assess for worsening  hypercarbia.  Acute respiratory failure with hypoxia and hypercarbia in setting of acute encephalopathy Status post trach/vent dependent - slow weaned off the vent, trach has ben changed to #6 cuffless 4/8, tolerated well. Continue supportive O2 supplement. Speech working on PMSV and swallow eval. patient with very slow recovery, she may need a PEG tube.  When afebrile discussed with family and patient option of possible PEG tube feedings.  Chest x-ray looks hazy with increasing vascular congestion, 1 dose of Lasix 60 mg IV given.  Continue management per PCCM. Continue  pulmonary toilet.  Repeat chest x-ray in 4/10  Sepsis E. coli UTI - resolved Completed treatment, initially treated with vancomycin and Zosyn subsequently changed to Rocephin x 5 days last day of treatment was 4/2.  Remains afebrile, sepsis physiology has resolved.  Fever - persistent  Unclear source at this point, she was treated for E. coli UTI completed antibiotic treatment. Sent for urine and blood culture.  Chest x-ray does not show any signs of pneumonia, patient has NG tube could have had aspiration we will repeat chest x-ray on 4/10. Will start empirically on Unasyn.     Hypertensive urgency/Elevated troponin - stable In setting of sepsis. BP significantly improved on current regimen Troponin elevation due to demand ischemia from sepsis and hypertensive episode Echocardiogram LVEF 55-60% and grade 1 diastolic dysfunction no wall motion abnormalities No ischemic workup indicated Continue hydralazine 100 mg every 8 hours, labetalol 200 mg 3 times a day  clonidine to 0.2 mg and increase Norvasc to 10 mg daily.  Continue hydralazine IV as needed Continue to monitor.  Oral candidiasis Started on Diflucan she had a course of 5 days for vulvovaginal candida, will complete treatment for 10 days.   Nausea and vomiting felt to be secondary to ileus - resolved  Continue MiraLAX, Senokot Mobilize as able, patient at baseline non-mobile Continue to monitor  Vulvovaginal candidiasis Completed treatment with Diflucan  Anemia of chronic disease Hemoglobin stable.  No signs of overt bleeding Will check CBC 4/9  Diabetes mellitus type II A1c 6.7 CBGs remains above goal, will increase Lantus again to 25 units. Continue sliding scale and adjust Lantus per 24-hour insulin requirement.  Chronic diastolic CHF  Seems to be  compensated at this time Monitor for signs of fluid overload  Severe deconditioning  DVT prophylaxis: Lovenox Code Status: Full code Family Communication: None at  bedside Disposition Plan: SNF pending nutrition status and speech therapy.  Consultants:   PCCM  Procedures:   ETT 3/25>> 3/29  Trach 3/29  Antimicrobials:  Vancomycin 3/24>>> 3/27  Zosyn 3/24>>> 3/27   Rocephin 3/27>>> 4/2   Objective: Vitals:   11/22/17 0800 11/22/17 0833 11/22/17 1140   BP: (!) 146/57 (!) 146/57 (!) 146/55   Pulse:  81 86   Resp:  (!) 22 (!) 100   Temp: 99.1 F (37.3 C)     TempSrc: Oral     SpO2: 96% 98%    Weight:      Height:        Intake/Output Summary (Last 24 hours) at 11/23/2017 0927 Last data filed at 11/23/2017 0500 Gross per 24 hour  Intake 1810 ml  Output 600 ml  Net 1210 ml   Filed Weights   11/21/17 0427 11/22/17 0205 11/22/17 0441  Weight: 84.9 kg (187 lb 2.7 oz) 84.9 kg (187 lb 2.7 oz) 84.9 kg (187 lb 2.7 oz)    Examination:   General: Somnolent, responds to verbal command Cardiovascular: RRR, S1/S2 + Respiratory: Decreased breath sounds bilaterally, rhonchi.  Trach in place Abdominal: Soft, NT, ND, bowel sounds + Extremities: Trace lower extremity edema   Data Reviewed: I have personally reviewed following labs and imaging studies  CBC: Recent Labs  Lab 11/18/17 0946 11/19/17 0314 11/20/17 0324 11/23/17 0309  WBC 10.9* 10.2 9.6 10.9*  NEUTROABS 6.7 6.2  --   --   HGB 8.2* 8.7* 8.9* 9.2*  HCT 26.7* 28.6* 30.0* 30.1*  MCV 90.5 91.1 92.0 89.6  PLT 423* 459* 480* 194*   Basic Metabolic Panel: Recent Labs  Lab 11/17/17 0318 11/18/17 0946 11/19/17 0314 11/23/17 0309  NA 139 142 141 139  K 3.7 3.8 3.6 4.3  CL 96* 101 103 102  CO2 33* 31 29 27   GLUCOSE 145* 110* 145* 217*  BUN 28* 24* 22* 12  CREATININE 0.81 0.86 0.83 0.74  CALCIUM 9.6 9.6 9.6 9.3  MG  --   --   --  2.0   GFR: Estimated Creatinine Clearance: 75.9 mL/min (by C-G formula based on SCr of 0.74 mg/dL). Liver Function Tests: Recent Labs  Lab 11/18/17 0946  AST 16  ALT 12*  ALKPHOS 47  BILITOT 0.7  PROT 6.9  ALBUMIN 2.7*   No  results for input(s): LIPASE, AMYLASE in the last 168 hours. No results for input(s): AMMONIA in the last 168 hours. Coagulation Profile: No results for input(s): INR, PROTIME in the last 168 hours. Cardiac Enzymes: No results for input(s): CKTOTAL, CKMB, CKMBINDEX, TROPONINI in the last 168 hours. BNP (last 3 results) No results for input(s): PROBNP in the last 8760 hours. HbA1C: No results for input(s): HGBA1C in the last 72 hours. CBG: Recent Labs  Lab 11/22/17 1624 11/22/17 1952 11/22/17 2316 11/23/17 0308 11/23/17 0754  GLUCAP 203* 200* 184* 222* 257*   Lipid Profile: No results for input(s): CHOL, HDL, LDLCALC, TRIG, CHOLHDL, LDLDIRECT in the last 72 hours. Thyroid Function Tests: No results for input(s): TSH, T4TOTAL, FREET4, T3FREE, THYROIDAB in the last 72 hours. Anemia Panel: No results for input(s): VITAMINB12, FOLATE, FERRITIN, TIBC, IRON, RETICCTPCT in the last 72 hours. Sepsis Labs: Recent Labs  Lab 11/17/17 0318  PROCALCITON 0.11    Recent Results (from the past 240 hour(s))  Culture, respiratory (NON-Expectorated)     Status: None   Collection Time: 11/15/17 10:26 AM  Result Value Ref Range Status   Specimen Description   Final    TRACHEAL ASPIRATE Performed at [facility] [address], [place], [place] [address]    Special Requests   Final    NONE Performed at [facility], [place] [address] [place], [place] [address]    Gram Stain   Final    FEW WBC PRESENT,BOTH PMN AND MONONUCLEAR RARE GRAM VARIABLE ROD Performed at [facility], [place] [address], [place], [place] [address]    Culture FEW CANDIDA DUBLINIENSIS  Final   Report Status 11/18/2017 FINAL  Final     Radiology Studies: Dg Chest Port 1 View  Result Date: 11/23/2017 CLINICAL DATA:  Acute respiratory failure, tracheostomy patient, sepsis, acute encephalopathy, former smoker. EXAM: PORTABLE CHEST 1 VIEW COMPARISON:  Portable chest x-ray of  November 18, 2017 FINDINGS: The lungs are well-expanded. The interstitial markings in the right lung are mildly increased overall as compared to the left. There is no significant pleural effusion and no pneumothorax. The cardiac silhouette remains enlarged. The pulmonary vascularity is mildly engorged. The tracheostomy tube tip projects between the clavicular heads. The esophagogastric tube tip in proximal port project below the GE junction. IMPRESSION: Mild overall increase in the prominence of the pulmonary interstitium on the right. This may reflect asymmetric pulmonary edema of cardiac or noncardiac cause. A diffuse interstitial pneumonia is felt less likely. There is no alveolar pneumonia on the right or left. Mild cardiomegaly with increased pulmonary vascular prominence. The support tubes are in reasonable position. Electronically Signed   By: David  Martinique M.D.   On: 11/23/2017 07:16    Scheduled Meds: . amLODipine  10 mg Per Tube Daily  . atorvastatin  30 mg Per Tube QHS  . bisacodyl  10 mg Rectal Daily  . chlorhexidine gluconate (MEDLINE KIT)  15 mL Mouth Rinse BID  . cloNIDine  0.2 mg Per Tube TID  . clopidogrel  75 mg Per Tube Daily  . enoxaparin (LOVENOX) injection  40 mg Subcutaneous Q24H  . free water  100 mL Per Tube Q8H  . furosemide  60 mg Intravenous Once  . hydrALAZINE  100 mg Oral Q8H  . insulin aspart  0-15 Units Subcutaneous Q4H  . insulin glargine  20 Units Subcutaneous Daily  . labetalol  200 mg Per Tube TID  . mouth rinse  15 mL Mouth Rinse QID  . metoCLOPramide (REGLAN) injection  5 mg Intravenous Q8H  . mupirocin ointment  1 application Nasal BID  . pantoprazole sodium  40 mg Per Tube Q1200  . polyethylene glycol  17 g Oral Daily  . senna-docusate  1 tablet Per Tube BID  . sodium chloride flush  3 mL Intravenous Q12H   Continuous Infusions: . sodium chloride 10 mL/hr at 11/22/17 1200  . feeding supplement (OSMOLITE 1.2 CAL) 60 mL/hr at 11/22/17 1200  . fluconazole  (DIFLUCAN) IV       LOS: 16 days    Time spent: Total of 25 minutes spent with pt, greater than 50% of which was spent in discussion of  treatment, counseling and coordination of care  Chipper Oman, MD Pager: Text Page via [email or web address]   If 7PM-7AM, please contact night-coverage [email or web address] 11/23/2017, 9:27 AM   Note - This record has been created using [employer]. Chart creation errors have been sought, but may not always have  been located. Such creation errors do not reflect on the standard of medical care.

## 2017-11-24 ENCOUNTER — Inpatient Hospital Stay (HOSPITAL_COMMUNITY): Payer: Medicaid Other

## 2017-11-24 LAB — BLOOD CULTURE ID PANEL (REFLEXED)
ACINETOBACTER BAUMANNII: NOT DETECTED
CANDIDA PARAPSILOSIS: NOT DETECTED
Candida albicans: NOT DETECTED
Candida glabrata: NOT DETECTED
Candida krusei: NOT DETECTED
Candida tropicalis: NOT DETECTED
ENTEROBACTERIACEAE SPECIES: NOT DETECTED
ENTEROCOCCUS SPECIES: NOT DETECTED
Enterobacter cloacae complex: NOT DETECTED
Escherichia coli: NOT DETECTED
HAEMOPHILUS INFLUENZAE: NOT DETECTED
KLEBSIELLA OXYTOCA: NOT DETECTED
Klebsiella pneumoniae: NOT DETECTED
LISTERIA MONOCYTOGENES: NOT DETECTED
METHICILLIN RESISTANCE: DETECTED — AB
NEISSERIA MENINGITIDIS: NOT DETECTED
Proteus species: NOT DETECTED
Pseudomonas aeruginosa: NOT DETECTED
SERRATIA MARCESCENS: NOT DETECTED
STAPHYLOCOCCUS AUREUS BCID: NOT DETECTED
STREPTOCOCCUS PNEUMONIAE: NOT DETECTED
STREPTOCOCCUS SPECIES: NOT DETECTED
Staphylococcus species: DETECTED — AB
Streptococcus agalactiae: NOT DETECTED
Streptococcus pyogenes: NOT DETECTED

## 2017-11-24 LAB — GLUCOSE, CAPILLARY
GLUCOSE-CAPILLARY: 203 mg/dL — AB (ref 65–99)
GLUCOSE-CAPILLARY: 257 mg/dL — AB (ref 65–99)
GLUCOSE-CAPILLARY: 113 mg/dL — AB (ref 65–99)
Glucose-Capillary: 214 mg/dL — ABNORMAL HIGH (ref 65–99)
Glucose-Capillary: 195 mg/dL — ABNORMAL HIGH (ref 65–99)
Glucose-Capillary: 70 mg/dL (ref 65–99)
Glucose-Capillary: 86 mg/dL (ref 65–99)
Glucose-Capillary: 115 mg/dL — ABNORMAL HIGH (ref 65–99)

## 2017-11-24 LAB — BASIC METABOLIC PANEL
Anion gap: 12 (ref 5–15)
BUN: 14 mg/dL (ref 6–20)
CO2: 26 mmol/L (ref 22–32)
Calcium: 9.1 mg/dL (ref 8.9–10.3)
Chloride: 97 mmol/L — ABNORMAL LOW (ref 101–111)
Creatinine, Ser: 0.81 mg/dL (ref 0.44–1.00)
GFR calc Af Amer: >60 mL/min (ref >60)
GFR calc non Af Amer: >60 mL/min (ref >60)
GLUCOSE: 214 mg/dL — AB (ref 65–99)
POTASSIUM: 4.1 mmol/L (ref 3.5–5.1)
SODIUM: 135 mmol/L (ref 135–145)

## 2017-11-24 LAB — CBC
HCT: 29.4 % — ABNORMAL LOW (ref 36.0–46.0)
HEMOGLOBIN: 9.1 g/dL — AB (ref 12.0–15.0)
MCH: 27.4 pg (ref 26.0–34.0)
MCHC: 31 g/dL (ref 30.0–36.0)
MCV: 88.6 fL (ref 78.0–100.0)
Platelets: 570 10*3/uL — ABNORMAL HIGH (ref 150–400)
RBC: 3.32 MIL/uL — ABNORMAL LOW (ref 3.87–5.11)
RDW: 14.8 % (ref 11.5–15.5)
WBC: 9.7 10*3/uL (ref 4.0–10.5)

## 2017-11-24 LAB — URINE CULTURE: Culture: NO GROWTH

## 2017-11-24 MED ORDER — JEVITY 1.2 CAL PO LIQD
1000.0000 mL | ORAL | Status: DC
Start: 2017-11-24 — End: 2017-11-27
  Administered 2017-11-24 – 2017-11-25 (×2): 1000 mL
  Filled 2017-11-24 (×3): qty 1000

## 2017-11-24 MED ORDER — INSULIN ASPART 100 UNIT/ML ~~LOC~~ SOLN
3.0000 [IU] | SUBCUTANEOUS | Status: DC
  Administered 2017-11-24 – 2017-11-27 (×7): 3 [IU] via SUBCUTANEOUS

## 2017-11-24 MED ORDER — VANCOMYCIN HCL 10 G IV SOLR
1250.0000 mg | INTRAVENOUS | Status: DC
  Administered 2017-11-25 – 2017-11-28 (×4): 1250 mg via INTRAVENOUS
  Filled 2017-11-24 (×5): qty 1250

## 2017-11-24 MED ORDER — VANCOMYCIN HCL 10 G IV SOLR
1250.0000 mg | Freq: Once | INTRAVENOUS | Status: AC
  Administered 2017-11-24: 1250 mg via INTRAVENOUS
  Filled 2017-11-24: qty 1250

## 2017-11-24 MED ORDER — INSULIN GLARGINE 100 UNIT/ML ~~LOC~~ SOLN
25.0000 [IU] | Freq: Every day | SUBCUTANEOUS | Status: DC
  Administered 2017-11-25 – 2017-11-30 (×5): 25 [IU] via SUBCUTANEOUS
  Filled 2017-11-24 (×6): qty 0.25

## 2017-11-24 MED ORDER — FREE WATER
100.0000 mL | Status: DC
  Administered 2017-11-24 – 2017-11-25 (×10): 100 mL

## 2017-11-24 NOTE — Progress Notes (Signed)
SLP Cancellation Note  Patient Details Name: Grace [name] MRN: [id number] DOB: 1956/07/06   Cancelled treatment:       Reason Eval/Treat Not Completed: Other (comment);Fatigue/lethargy limiting ability to participate   Grace [name], Grace [name] 11/24/2017, 10:44 AM  Grace Burnetamara Ulus Hazen, MS [facility] SLP [phone]

## 2017-11-24 NOTE — Progress Notes (Signed)
PHARMACY - PHYSICIAN COMMUNICATION CRITICAL VALUE ALERT - BLOOD CULTURE IDENTIFICATION (BCID)  Grace [name] is an 62 y.o. female who [facility] with a chief complaint of altered mental status  Assessment:  Pt underwent full course of abx but does not seem to be improving. Currently on Unasyn for aspiration PNA (include suspected source if known)  Name of physician (or Provider) Contacted: Dr. Alvino Chapelhoi  Current antibiotics: Unasyn 3 gr IV q6h  Changes to prescribed antibiotics recommended:  Recommendations accepted by provider   - change to vancomycin   Results for orders placed or performed during the hospital encounter of 11/07/17  Blood Culture ID Panel (Reflexed) (Collected: 11/23/2017 11:11 AM)  Result Value Ref Range   Enterococcus species NOT DETECTED NOT DETECTED   Listeria monocytogenes NOT DETECTED NOT DETECTED   Staphylococcus species DETECTED (A) NOT DETECTED   Staphylococcus aureus NOT DETECTED NOT DETECTED   Methicillin resistance DETECTED (A) NOT DETECTED   Streptococcus species NOT DETECTED NOT DETECTED   Streptococcus agalactiae NOT DETECTED NOT DETECTED   Streptococcus pneumoniae NOT DETECTED NOT DETECTED   Streptococcus pyogenes NOT DETECTED NOT DETECTED   Acinetobacter baumannii NOT DETECTED NOT DETECTED   Enterobacteriaceae species NOT DETECTED NOT DETECTED   Enterobacter cloacae complex NOT DETECTED NOT DETECTED   Escherichia coli NOT DETECTED NOT DETECTED   Klebsiella oxytoca NOT DETECTED NOT DETECTED   Klebsiella pneumoniae NOT DETECTED NOT DETECTED   Proteus species NOT DETECTED NOT DETECTED   Serratia marcescens NOT DETECTED NOT DETECTED   Haemophilus influenzae NOT DETECTED NOT DETECTED   Neisseria meningitidis NOT DETECTED NOT DETECTED   Pseudomonas aeruginosa NOT DETECTED NOT DETECTED   Candida albicans NOT DETECTED NOT DETECTED   Candida glabrata NOT DETECTED NOT DETECTED   Candida krusei NOT DETECTED NOT DETECTED   Candida parapsilosis NOT DETECTED NOT DETECTED   Candida tropicalis NOT DETECTED NOT DETECTED     Adalberto ColeNikola Dearis [name], PharmD, BCPS Pager [phone] 11/24/2017 2:39 PM

## 2017-11-24 NOTE — Progress Notes (Signed)
Pharmacy Antibiotic Note  Grace [name] is a 62 y.o. female admitted on 11/07/2017 with bacteremia.  Pharmacy has been consulted for vancomycin dosing. GPC clusters (BCID = MR-CoNS) currently growing in Bx 1/2 sets (on both aerobic and anaerobic bottles on the one set).  Due to clinical picture, decision made to treat  Plan:  Vancomycin 1250mg  IV q24h  Check SCr every other day  Monitor renal function  Follow cultures  Check levels as indicated  Height: 5\' 4"  (162.6 cm) Weight: 187 lb 2.7 oz (84.9 kg) IBW/kg (Calculated) : 54.7  Temp (24hrs), Avg:100.5 F (38.1 C), Min:99.7 F (37.6 C), Max:101.1 F (38.4 C)  Recent Labs  Lab 11/18/17 0946 11/19/17 0314 11/20/17 0324 11/23/17 0309 11/24/17 0814  WBC 10.9* 10.2 9.6 10.9* 9.7  CREATININE 0.86 0.83  --  0.74 0.81    Estimated Creatinine Clearance: 75 mL/min (by C-G formula based on SCr of 0.81 mg/dL).    No Known Allergies  Antimicrobials this admission: 3/24 zosyn >> 3/27 3/24 vancomycin >> 3/27 3/27 rocephin >> 4/1 4/1 fluconazole x 3 doses >> 4/3, 4/9 >> 4/9 Unasyn >> 4/10 4/10 vanco >>  Dose adjustments this admission:  Microbiology results: 3/24 BCx x2: neg FINAL 3/25 UCx: >100k E.coli (S rocephin, cefazolin, imip, unasyn, zosyn) FINAL 3/25 MRSA PCR: positive 3/25 HIV Ab: NR 4/1: Tracheal aspirate: few Candida dublinensis 4/9 BCx: GPC 1/2 sets (in both bottles on the one set) BCID = MR-CoNS 4/9 Urine BCx: sent   Thank you for allowing pharmacy to be a part of this patient's care.  Juliette Alcideustin Chanoch Mccleery, PharmD, BCPS.   Pager: [phone] 11/24/2017 2:48 PM

## 2017-11-24 NOTE — Progress Notes (Signed)
Dr Alvino Chapelhoi notified that pt pulled out NG

## 2017-11-24 NOTE — Progress Notes (Signed)
NG placed in left nare, x-ray ordered for placement. Pt tolerated well.

## 2017-11-24 NOTE — Progress Notes (Signed)
PROGRESS NOTE    Grace [name]  BXI:[id number] DOB: 30-Dec-1955 DOA: 11/07/2017 PCP: Earlyne Iba, MD    Brief Narrative:  Grace [name] 62 year old female with past medical history of CVA with left hemiparesis, hypertension and CAD who presented to the emergency department with fevers and elevated blood pressure.  She was admitted on 3/24 after being found encephalopathic and septic due to UTI.  On 3/25 she had acute changes on mental status and PCCM was consulted. Patient was intubated for airway protection and worsening HCRF, patient self extubated on 3/26, failed with continued hypoxia and was re-intubated due to ongoing AMS.  Patient underwent trach 3/29, ventilator was weaned slowly.  Patient blood pressure has improved.  Patient completed treatment for UTI.  Patient developed possible ileus due to poor mobility, which now has resolved.  Patient now on trach collar. Nutrition via NGT. SLP working on [employer] and voice. Due to continued fevers, repeat cultures were obtained and with concern for aspiration (patient had removed her NGT numerous times), empiric unasyn was started.   Assessment & Plan:   Active Problems:   Sepsis ([place])   Acute respiratory failure with hypoxemia (HCC)   Hypertensive emergency   Hypoxia   Acute encephalopathy   Acute metabolic encephalopathy felt to be multifactorial from hypertensive encephalopathy (PRES), infectious process and hypercarbia. Stable over last 24 hours per staff. Patient alert to voice but does not answer questions or follow commands.  Acute respiratory failure with hypoxia and hypercarbia in setting of acute encephalopathy Status post trach/vent dependent - slow weaned off the vent, trach has ben changed to #6 cuffless 4/8, tolerated well. Continue supportive O2 supplement. Speech working on PMSV and swallow eval. patient with very slow recovery, she may need a PEG tube once fever resolves.   Sepsis E. coli UTI -  resolved Completed treatment, initially treated with vancomycin and Zosyn subsequently changed to Rocephin x 5 days last day of treatment was 4/2.   Fever - persistent  Unclear source at this point, she was treated for E. coli UTI completed antibiotic treatment as above. Repeat blood culture and urine culture pending. Repeat CXR without consolidation. Question aspiration due to repeatedly self-removing NGT. Started empirically on Unasyn.     Hypertensive urgency/Elevated troponin - stable Troponin elevation due to demand ischemia from sepsis and hypertensive episode Echocardiogram LVEF 55-60% and grade 1 diastolic dysfunction no wall motion abnormalities No ischemic workup indicated Continue hydralazine 100 mg every 8 hours, labetalol 200 mg TID, clonidine to 0.2 mg TID and Norvasc to 10 mg daily.  Continue hydralazine IV as needed Continue to monitor  HLD Continue lipitor   Oral candidiasis and vulvovaginal candidiasis Treat with Diflucan   Nausea and vomiting felt to be secondary to ileus - resolved  Continue MiraLAX, Senokot Mobilize as able, patient at baseline non-mobile Continue to monitor  Anemia of chronic disease Hemoglobin stable.  No signs of overt bleeding  Diabetes mellitus type II A1c 6.7 Lantus, Novolog adjusted per DM coordinator recommendations   Chronic diastolic CHF  Seems to be compensated at this time Monitor for signs of fluid overload  Severe deconditioning Will need SNF on discharge     DVT prophylaxis: Lovenox Code Status: Full Family Communication: No family at bedside, spoke with husband at bedside  Disposition Plan: Will need resolution of fever, PEG placement, and eventually SNF placement   Consultants:   PCCM   Procedures:   ETT 3/25>> 3/29  Trach 3/29  Antimicrobials:  Anti-infectives (From  admission, onward)   Start     Dose/Rate Route Frequency Ordered Stop   11/23/17 1800  Ampicillin-Sulbactam (UNASYN) 3 g in sodium  chloride 0.9 % 100 mL IVPB     3 g 200 mL/hr over 30 Minutes Intravenous Every 6 hours 11/23/17 1549     11/23/17 1100  fluconazole (DIFLUCAN) IVPB 200 mg     200 mg 100 mL/hr over 60 Minutes Intravenous Every 24 hours 11/23/17 0926 11/30/17 1059   11/15/17 2245  fluconazole (DIFLUCAN) 40 MG/ML suspension 200 mg     200 mg Per Tube Daily 11/15/17 2243 11/17/17 0931   11/10/17 1400  cefTRIAXone (ROCEPHIN) 2 g in sodium chloride 0.9 % 100 mL IVPB  Status:  Discontinued     2 g 200 mL/hr over 30 Minutes Intravenous Every 24 hours 11/10/17 1002 11/15/17 0849   11/08/17 1000  vancomycin (VANCOCIN) IVPB 1000 mg/200 mL premix  Status:  Discontinued     1,000 mg 200 mL/hr over 60 Minutes Intravenous Every 24 hours 11/08/17 0409 11/10/17 0958   11/08/17 0400  piperacillin-tazobactam (ZOSYN) IVPB 3.375 g  Status:  Discontinued     3.375 g 12.5 mL/hr over 240 Minutes Intravenous Every 8 hours 11/07/17 2325 11/10/17 1001   11/07/17 1845  piperacillin-tazobactam (ZOSYN) IVPB 3.375 g     3.375 g 100 mL/hr over 30 Minutes Intravenous STAT 11/07/17 1835 11/07/17 2131   11/07/17 1830  vancomycin (VANCOCIN) IVPB 1000 mg/200 mL premix     1,000 mg 200 mL/hr over 60 Minutes Intravenous  Once 11/07/17 1828 11/07/17 2345   11/07/17 1830  piperacillin-tazobactam (ZOSYN) IVPB 2.25 g  Status:  Discontinued     2.25 g 100 mL/hr over 30 Minutes Intravenous Every 6 hours 11/07/17 1828 11/07/17 1835       Subjective: Nonverbal this morning   Objective: Vitals:   11/24/17 0800 11/24/17 0930 11/24/17 1000 11/24/17 1154  BP: (!) 152/55 (!) 156/61 (!) 172/68   Pulse:  100    Resp:      Temp: (!) 101 F (38.3 C)   99.9 F (37.7 C)  TempSrc: Oral   Oral  SpO2: 96%  98%   Weight:      Height:        Intake/Output Summary (Last 24 hours) at 11/24/2017 1303 Last data filed at 11/24/2017 0600 Gross per 24 hour  Intake 1750 ml  Output 1950 ml  Net -200 ml   Filed Weights   11/21/17 0427 11/22/17 0205  11/22/17 0441  Weight: 84.9 kg (187 lb 2.7 oz) 84.9 kg (187 lb 2.7 oz) 84.9 kg (187 lb 2.7 oz)    Examination:  General exam: Appears calm and comfortable  Respiratory system: Clear to auscultation. Respiratory effort normal on trach collar  Cardiovascular system: S1 & S2 heard, RRR. No JVD, murmurs, rubs, gallops or clicks. No pedal edema. Gastrointestinal system: Abdomen is nondistended, soft and nontender. No organomegaly or masses felt. Normal bowel sounds heard. +NGT  Central nervous system: Alert but nonverbal  Extremities: Symmetric  Skin: No rashes, lesions or ulcers  Data Reviewed: I have personally reviewed following labs and imaging studies  CBC: Recent Labs  Lab 11/18/17 0946 11/19/17 0314 11/20/17 0324 11/23/17 0309 11/24/17 0814  WBC 10.9* 10.2 9.6 10.9* 9.7  NEUTROABS 6.7 6.2  --   --   --   HGB 8.2* 8.7* 8.9* 9.2* 9.1*  HCT 26.7* 28.6* 30.0* 30.1* 29.4*  MCV 90.5 91.1 92.0 89.6 88.6  PLT 423* 459*  480* 563* 863*   Basic Metabolic Panel: Recent Labs  Lab 11/18/17 0946 11/19/17 0314 11/23/17 0309 11/24/17 0814  NA 142 141 139 135  K 3.8 3.6 4.3 4.1  CL 101 103 102 97*  CO2 _0 GLUCOSE 110* 145* 217* 214*  BUN 24* 22* 12 14  CREATININE 0.86 0.83 0.74 0.81  CALCIUM 9.6 9.6 9.3 9.1  MG  --   --  2.0  --    GFR: Estimated Creatinine Clearance: 75 mL/min (by C-G formula based on SCr of 0.81 mg/dL). Liver Function Tests: Recent Labs  Lab 11/18/17 0946  AST 16  ALT 12*  ALKPHOS 47  BILITOT 0.7  PROT 6.9  ALBUMIN 2.7*   No results for input(s): LIPASE, AMYLASE in the last 168 hours. No results for input(s): AMMONIA in the last 168 hours. Coagulation Profile: No results for input(s): INR, PROTIME in the last 168 hours. Cardiac Enzymes: No results for input(s): CKTOTAL, CKMB, CKMBINDEX, TROPONINI in the last 168 hours. BNP (last 3 results) No results for input(s): PROBNP in the last 8760 hours. HbA1C: No results for input(s): HGBA1C in  the last 72 hours. CBG: Recent Labs  Lab 11/23/17 1935 11/23/17 2316 11/24/17 0310 11/24/17 0839 11/24/17 1144  GLUCAP 181* 214* 214* 203* 257*   Lipid Profile: No results for input(s): CHOL, HDL, LDLCALC, TRIG, CHOLHDL, LDLDIRECT in the last 72 hours. Thyroid Function Tests: No results for input(s): TSH, T4TOTAL, FREET4, T3FREE, THYROIDAB in the last 72 hours. Anemia Panel: No results for input(s): VITAMINB12, FOLATE, FERRITIN, TIBC, IRON, RETICCTPCT in the last 72 hours. Sepsis Labs: No results for input(s): PROCALCITON, LATICACIDVEN in the last 168 hours.  Recent Results (from the past 240 hour(s))  Culture, respiratory (NON-Expectorated)     Status: None   Collection Time: 11/15/17 10:26 AM  Result Value Ref Range Status   Specimen Description   Final    TRACHEAL ASPIRATE Performed at [facility] [address] [place], [place] [address]    Special Requests   Final    NONE Performed at [facility], [place] [address], [place], [place] [address]    Gram Stain   Final    FEW WBC PRESENT,BOTH PMN AND MONONUCLEAR RARE GRAM VARIABLE ROD Performed at [facility], [place] [address] [place], [place] [address]    Culture FEW CANDIDA DUBLINIENSIS  Final   Report Status 11/18/2017 FINAL  Final  Urine Culture     Status: None   Collection Time: 11/23/17 10:41 AM  Result Value Ref Range Status   Specimen Description   Final    URINE, CATHETERIZED Performed at [facility] [address], [place], [place] [address]    Special Requests   Final    NONE Performed at [facility], [place] [address], [place], [place] [address]    Culture   Final    NO GROWTH Performed at [facility], [place] [address] [place], [place] [address]    Report Status 11/24/2017 FINAL  Final  Culture, blood (Routine X 2) w Reflex to ID Panel     Status: None (Preliminary result)   Collection Time: 11/23/17 11:11 AM   Result Value Ref Range Status   Specimen Description   Final    BLOOD RIGHT HAND Performed at [facility] [address] [place], [place] [address]    Special Requests   Final    BOTTLES DRAWN AEROBIC  AND ANAEROBIC Blood Culture adequate volume Performed at [facility] [address] [place], [place] [address]    Culture  Setup Time   Final    GRAM POSITIVE COCCI ANAEROBIC BOTTLE ONLY Organism ID to follow Performed at [facility], [place] [address], [place], [place] [address]    Culture [facility] POSITIVE COCCI  Final   Report Status PENDING  Incomplete       Radiology Studies: Dg Chest Port 1 View  Result Date: 11/24/2017 CLINICAL DATA:  Respiratory failure EXAM: PORTABLE CHEST 1 VIEW COMPARISON:  11/23/2017 FINDINGS: Cardiac shadow is mildly enlarged but stable. Tracheostomy tube and nasogastric catheter are again seen and stable. Lungs are well aerated bilaterally without focal infiltrate or sizable effusion. IMPRESSION: No acute abnormality noted. Electronically Signed   By: Inez Catalina M.D.   On: 11/24/2017 07:29   Dg Chest Port 1 View  Result Date: 11/23/2017 CLINICAL DATA:  Acute respiratory failure, tracheostomy patient, sepsis, acute encephalopathy, former smoker. EXAM: PORTABLE CHEST 1 VIEW COMPARISON:  Portable chest x-ray of November 18, 2017 FINDINGS: The lungs are well-expanded. The interstitial markings in the right lung are mildly increased overall as compared to the left. There is no significant pleural effusion and no pneumothorax. The cardiac silhouette remains enlarged. The pulmonary vascularity is mildly engorged. The tracheostomy tube tip projects between the clavicular heads. The esophagogastric tube tip in proximal port project below the GE junction. IMPRESSION: Mild overall increase in the prominence of the pulmonary interstitium on the right. This may reflect asymmetric pulmonary edema of cardiac or noncardiac cause. A  diffuse interstitial pneumonia is felt less likely. There is no alveolar pneumonia on the right or left. Mild cardiomegaly with increased pulmonary vascular prominence. The support tubes are in reasonable position. Electronically Signed   By: David  Martinique M.D.   On: 11/23/2017 07:16      Scheduled Meds: . amLODipine  10 mg Per Tube Daily  . atorvastatin  30 mg Per Tube QHS  . bisacodyl  10 mg Rectal Daily  . chlorhexidine gluconate (MEDLINE KIT)  15 mL Mouth Rinse BID  . cloNIDine  0.2 mg Per Tube TID  . clopidogrel  75 mg Per Tube Daily  . enoxaparin (LOVENOX) injection  40 mg Subcutaneous Q24H  . free water  100 mL Per Tube Q4H  . hydrALAZINE  100 mg Oral Q8H  . insulin aspart  0-15 Units Subcutaneous Q4H  . insulin aspart  3 Units Subcutaneous Q4H  . [START ON 11/25/2017] insulin glargine  25 Units Subcutaneous Daily  . labetalol  200 mg Per Tube TID  . mouth rinse  15 mL Mouth Rinse QID  . metoCLOPramide (REGLAN) injection  5 mg Intravenous Q8H  . mupirocin ointment  1 application Nasal BID  . pantoprazole sodium  40 mg Per Tube Q1200  . polyethylene glycol  17 g Oral Daily  . senna-docusate  1 tablet Per Tube BID  . sodium chloride flush  3 mL Intravenous Q12H   Continuous Infusions: . sodium chloride 10 mL/hr at 11/24/17 0600  . ampicillin-sulbactam (UNASYN) IV Stopped (11/24/17 1146)  . feeding supplement (JEVITY 1.2 CAL) 1,000 mL (11/24/17 1116)  . fluconazole (DIFLUCAN) IV 200 mg (11/24/17 1150)     LOS: 17 days    Time spent: 25 minutes   Dessa Phi, DO [facility] [email or web address] Password [email or web address] 11/24/2017, 1:03 PM

## 2017-11-24 NOTE — Progress Notes (Signed)
Inpatient Diabetes Program Recommendations  AACE/ADA: New Consensus Statement on Inpatient Glycemic Control (2015)  Target Ranges:  Prepandial:   less than 140 mg/dL      Peak postprandial:   less than 180 mg/dL (1-2 hours)      Critically ill patients:  140 - 180 mg/dL   Results for Jacalyn LefevreBORDEAUX, Aziah (MRN [id number]) as of 11/24/2017 11:00  Ref. Range 11/22/2017 23:16 11/23/2017 03:08 11/23/2017 07:54 11/23/2017 13:08 11/23/2017 16:04 11/23/2017 19:35  Glucose-Capillary Latest Ref Range: 65 - 99 mg/dL 409184 (H) 811222 (H) 914257 (H) 210 (H) 227 (H) 181 (H)   Results for Jacalyn LefevreBORDEAUX, Promyse (MRN [id number]) as of 11/24/2017 11:00  Ref. Range 11/23/2017 23:16 11/24/2017 03:10 11/24/2017 08:39  Glucose-Capillary Latest Ref Range: 65 - 99 mg/dL 086214 (H) 578214 (H) 469203 (H)    Home DM Meds: Metformin 500 mg BID       Lantus 20 units QHS       Humalog 2-8 units BID per SSI         Current Insulin Orders: Lantus 20 units daily      Novolog Moderate Correction Scale/ SSI (0-15 units) Q4 hours       Note patient getting tube feedings of Jevity (goal rate 60cc/hour).  Has been getting Osmolite TF at 60cc/hour.  CBGs elevated >200 mg/dl.    MD- Please consider the following in-hospital insulin adjustments:  1. Increase Lantus slightly to 25 units daily  2. Start Novolog Tube Feed Coverage: Novolog 3 units Q4 hours (hold if tube feeds held for any reason)      --Will follow patient during hospitalization--  Ambrose FinlandJeannine Johnston Caitlyn Buchanan RN, MSN, CDE Diabetes Coordinator Inpatient Glycemic Control Team Team Pager: [phone] (8a-5p)

## 2017-11-24 NOTE — Progress Notes (Signed)
  Speech Language Pathology Treatment: Grace [name] Speaking valve  Patient Details Name: Grace [name] MRN: [id number] DOB: [birth year] Today's Date: 11/24/2017 Time: 1430-1500 SLP Time Calculation (min) (ACUTE ONLY): 30 min  Assessment / Plan / Recommendation Clinical Impression  Pt upright in bed today, alert but does become lethargic easily during session.  SLP occluded trach and pt was able to obtain voicing despite wet/gurgly breathing.  She was able to cough and expectorate frothy thin secretions orally with pmsv in place - which effectively cleared gurgly breathing quality.  No breath stacking noted today and pt's vitals remained stable during 25 minute session. She did require max cues to stay alert during treatment session.  Pt reports she wants "to leave", "her husband" and "to eat".    Note plan for possible PEG tube = but would recommend pt have an MBS during this hospital coarse to determine possible therapeutic feeds for comfort/swallow rehab.    Using teach back, pt able to state why she remains in hospital and potential plans for PEG.     Advised RN to pt's ability to hock and clear secretions with PMSV in place - suspect secretions are above trach tube and use of valve faciliates clearance.  Recommend to continue valve with full supervision.   HPI HPI: Grace [name] 62 year old female with past medical history of CVA with left hemiparesis, hypertension and CAD who presented to the emergency department with fevers and elevated blood pressure.  She was admitted on 3/24 after being found encephalopathic and septic due to UTI.  On 3/25 she had acute changes on mental status and PCCM was consulted. Patient was intubated for airway protection and worsening HCRF, patient self extubated on 3/26, failed with continued hypoxia and was remains intubated due to ongoing AMS.  Patient underwent trach 3/29.  Patient completed treatment for UTI per MD notes.  Patient developed possible ileus  due to poor mobility.  Patient now on trach support with vent at night.  PMSV ordered.        SLP Plan  Continue with current plan of care       Recommendations         Patient may use Passy-[name] Speech Valve: Intermittently with supervision(for short amount of time with full supervision) PMSV Supervision: Full         Oral Care Recommendations: Oral care QID Follow up Recommendations: Skilled Nursing facility SLP Visit Diagnosis: Aphonia (R49.1) Plan: Continue with current plan of care       GO                [name], [name] 11/24/2017, 3:18 PM  [name], MS [facility] SLP [phone]

## 2017-11-24 NOTE — Progress Notes (Signed)
Nutrition Follow-up  DOCUMENTATION CODES:   Obesity unspecified  INTERVENTION:  - Will adjust TF regimen in hopes of improved glycemic control. - Will order Jevity 1.2 @ 30 mL/hr to advance by 10 mL every 12 hours to reach goal rate of Jevity 1.2 @ 60 mL/hr. At goal rate, this regimen will provide 1728 kcal, 80 grams of protein, 26 grams of fiber, and 1162 mL free water. - Will increase free water flush to 100 mL every 4 hours (600 mL/day) d/t addition of fiber and recent hx of ileus.    NUTRITION DIAGNOSIS:   Inadequate oral intake related to inability to eat as evidenced by NPO status. -ongoing  GOAL:   Patient will meet greater than or equal to 90% of their needs -met with TF regimen.  MONITOR:   TF tolerance, Weight trends, Labs  ASSESSMENT:   62 yo female admitted from SNF on 3/24 with abdominal pain secondary to sepsis, AMS. PMH HTN, stroke, DM  Weight trending down with Lasix; current weight is -13 lbs/5.9 kg compared to admission weight. Pt sleeping at this time with no family/visitors present. NGT in place. Current NGT placed on 4/5. Previous NGT placed on 4/3 and pt pulled it x2 requiring replacement. Dr. Elza Rafter note from yesterday morning states "pt with very slow recovery, she may need a PEG tube. When afebrile discuss with family and patient option of possible PEG tube feedings." She is working with SLP with PMV. Pt currently receiving Osmolite 1.2 @ 60 mL/hr (goal rate) with 100 mL free water TID via NGT. This regimen is providing 1728 kcal, 80 grams of protein, and 1481 mL free water.  Dr. Elza Rafter note states that ileus has resolved. Noted CBGs trending up, remaining in the high 100s to mid 200s since 4/5. Will adjust TF as outlined above and monitor CBGs. RD is hopeful that with the change to a fiber-containing feeding CBGs will improve. Will make further adjustment if warranted moving forward. Will monitor for plan concerning PEG.  Medications reviewed; 60 mg IV  Lasix x1 dose yesterday, sliding scale Novolog, 20 units Lantus/day, 5 mg IV Reglan TID, 40 mg Protonix per NGT/day, 1 packet Miralax/day, 1 tablet Senokot BID. Labs reviewed; CBGs: 214 and 203 mg/dL this AM, Cl: 97 mL/min.     Diet Order:  Diet NPO time specified  EDUCATION NEEDS:   Not appropriate for education at this time  Skin:  Other: excoriated marks BL arms with ecchymosis  Last BM:  4/7  Height:   Ht Readings from Last 1 Encounters:  11/11/17 5' 4"  (1.626 m)    Weight:   Wt Readings from Last 1 Encounters:  11/22/17 187 lb 2.7 oz (84.9 kg)    Ideal Body Weight:  54.5 kg  BMI:  Body mass index is 32.13 kg/m.  Estimated Nutritional Needs:   Kcal:  1590-1770 (18-20 kcal/kg)  Protein:  80-90 grams  Fluid:  >=1.8 L/day      Jarome Matin, MS, RD, LDN, [facility] Inpatient Clinical Dietitian Pager # [phone] After hours/weekend pager # [phone]

## 2017-11-24 NOTE — Progress Notes (Signed)
Pt pulled out NG tube, will replace, get an xray and place mitten on pt's right hand

## 2017-11-24 NOTE — Progress Notes (Signed)
I spoke over the phone with husband regarding family's request for transfer [place] in [place]. He understands that patient does not meet medical criteria for transfer to a different hospital system. I also spoke with brother over the phone and explained this to him. He asks for the transfer as family lives [place]5 hour drive away and it is too far. I explained to him that patient must have a medical care need that cannot be fulfilled from our hospital before any facility would be accepting patient for a transfer. He voiced understanding.    Noralee StainJennifer Amalee Olsen, DO [facility] [email or web address] Password [email or web address] 11/24/2017, 4:38 PM

## 2017-11-24 NOTE — Progress Notes (Addendum)
Patient family requested to see CSW. CSW met with family pt. Brother and sister in law at bedside. Patient family is requesting patient be transferred to [facility] in [place] to be closer to her spouse and other family members.  CSW informed family this is a matter that would have to be discussed with physician/ Medical need? CSW informed patient nurse about patient concerns.  Patient physician aware and will follow up with patient family.   Kathrin Greathouse, Latanya Presser, MSW Clinical Social Worker  [phone] 11/24/2017  4:20 PM

## 2017-11-25 LAB — CBC
HCT: 28.7 % — ABNORMAL LOW (ref 36.0–46.0)
HEMOGLOBIN: 8.8 g/dL — AB (ref 12.0–15.0)
MCH: 27.5 pg (ref 26.0–34.0)
MCHC: 30.7 g/dL (ref 30.0–36.0)
MCV: 89.7 fL (ref 78.0–100.0)
Platelets: 572 10*3/uL — ABNORMAL HIGH (ref 150–400)
RBC: 3.2 MIL/uL — AB (ref 3.87–5.11)
RDW: 14.9 % (ref 11.5–15.5)
WBC: 7.3 10*3/uL (ref 4.0–10.5)

## 2017-11-25 LAB — GLUCOSE, CAPILLARY
GLUCOSE-CAPILLARY: 129 mg/dL — AB (ref 65–99)
Glucose-Capillary: 126 mg/dL — ABNORMAL HIGH (ref 65–99)
Glucose-Capillary: 160 mg/dL — ABNORMAL HIGH (ref 65–99)
Glucose-Capillary: 152 mg/dL — ABNORMAL HIGH (ref 65–99)
Glucose-Capillary: 89 mg/dL (ref 65–99)

## 2017-11-25 LAB — PROCALCITONIN: Procalcitonin: <0.1 ng/mL

## 2017-11-25 LAB — BASIC METABOLIC PANEL
ANION GAP: 9 (ref 5–15)
BUN: 11 mg/dL (ref 6–20)
CHLORIDE: 98 mmol/L — AB (ref 101–111)
CO2: 30 mmol/L (ref 22–32)
Calcium: 9.3 mg/dL (ref 8.9–10.3)
Creatinine, Ser: 0.74 mg/dL (ref 0.44–1.00)
GFR calc non Af Amer: >60 mL/min (ref >60)
Glucose, Bld: 139 mg/dL — ABNORMAL HIGH (ref 65–99)
POTASSIUM: 4.3 mmol/L (ref 3.5–5.1)
SODIUM: 137 mmol/L (ref 135–145)

## 2017-11-25 NOTE — Progress Notes (Signed)
Patient Demographics:    Grace [name], is a 62 y.o. female, DOB - 11/21/1955, NKN:[id number]  Admit date - 11/07/2017   Admitting Physician Elwin Mocha, MD  Outpatient Primary MD for the patient is Duffy Bruce, Manya Silvas, MD  LOS - 18   Chief Complaint  Patient presents with  . Abdominal Pain  . Fever        Subjective:    Grace [name] today has no fevers, no emesis,  No chest pain, tolerating Dobbhoff tube feeding well, follows commands  Assessment  & Plan :    Principal Problem:   Acute encephalopathy Active Problems:   Sepsis ([place])   Acute respiratory failure with hypoxemia [facility])   Hypertensive emergency   Hypoxia   Brief Narrative:  Grace Bordeaux48 year old female with past medical history of CVA with left hemiparesis, hypertension and CAD who presented to the emergency department with fevers and elevated blood pressure. She was admitted on 3/24 after being found encephalopathic and septic due to UTI. On 3/25 she had acute changes on mental status and PCCM was consulted. Patient was intubated for airway protection and worsening HCRF, patient self extubated on 3/26, failed with continued hypoxia and was re-intubated due to ongoing AMS. Patient underwent trach 3/29, ventilator was weaned slowly. Patient blood pressure has improved. Patient completed treatment for UTI. Patient developed possible ileus due to poor mobility, which now has resolved. Patient now on trach collar. Nutrition via NGT. SLP working on [employer] and voice. Due to continued fevers, repeat cultures were obtained and with concern for aspiration (patient had removed her NGT numerous times), empiric unasyn was started.   Assessment & Plan:   Active Problems:   Sepsis ([place])   Acute respiratory failure with hypoxemia (HCC)   Hypertensive emergency   Hypoxia   Acute encephalopathy   Acute metabolic  encephalopathy felt to be multifactorial from hypertensive encephalopathy (PRES), infectious process and hypercarbia. Stable ,  follow commands.   Acute respiratory failure with hypoxia and hypercarbia in setting of acute encephalopathy Status post trach/vent dependent - slow weaned off the vent, trach has ben changed to #6cuffless 4/8, tolerated well. Continue supportive O2 supplement. Speech workingon PMSV and swallow eval.patient with very slow recovery, patient's husband agreeable to PEG tube  placement once fevers resolved.   Sepsis E. coli UTI - resolved Completed treatment, initially treated with vancomycin and Zosyn subsequently changed to Rocephin x 5 days last day of treatment was 4/2.   Fever-  Unclear source at this point, she was treated for E. coli UTI completed antibiotic treatment as above. Repeat blood culture and urine culture pending. Repeat CXR without consolidation. Question aspiration due to repeatedly self-removing NGT. Started empirically on Unasyn.  Hypertensive urgency/Elevated troponin - stable Troponin elevation due to demand ischemia from sepsis and hypertensive episode Echocardiogram LVEF 55-60% and grade 1 diastolic dysfunction no wall motion abnormalities No ischemic workup indicated Continue hydralazine 100 mg every 8 hours, labetalol 200 mg TID,clonidine to 0.2 mg TID and Norvasc to 10 mg daily. Continue hydralazine IV as needed Continue to monitor  HLD Continue lipitor   Oral candidiasis and vulvovaginal candidiasis Treat with Diflucan   Nausea and vomiting felt to be secondary to ileus - resolved  Continue MiraLAX, Senokot  Mobilize as able, patient at baseline non-mobile Continue to monitor  Anemia of chronic disease Hemoglobin stable. No signs of overt bleeding  Diabetes mellitus type II A1c 6.7 Lantus, Novolog adjusted per DM coordinator recommendations   Chronic diastolic CHF  Seems to be compensated at this time Monitor  for signs of fluid overload  Severe deconditioning Will need SNF on discharge   FEN-tolerating Dobbhoff tube feeding well, patient's husband Grace [name] is agreeable to PEG tube placement, get IR consult for possible PEG tube placement on 11/26/2017 if patient has no more fevers   DVT prophylaxis: Lovenox Code Status: Full Family Communication:  spoke with husband Johhnny by phone Disposition Plan: Will need resolution of fever, PEG placement, and eventually SNF placement   Consultants:   PCCM   Procedures:   ETT 3/25>> 3/29  Trach 3/29   DVT Prophylaxis  :  Lovenox   Lab Results  Component Value Date   PLT 572 (H) 11/25/2017    Inpatient Medications  Scheduled Meds: . amLODipine  10 mg Per Tube Daily  . atorvastatin  30 mg Per Tube QHS  . bisacodyl  10 mg Rectal Daily  . chlorhexidine gluconate (MEDLINE KIT)  15 mL Mouth Rinse BID  . cloNIDine  0.2 mg Per Tube TID  . clopidogrel  75 mg Per Tube Daily  . enoxaparin (LOVENOX) injection  40 mg Subcutaneous Q24H  . free water  100 mL Per Tube Q4H  . hydrALAZINE  100 mg Oral Q8H  . insulin aspart  0-15 Units Subcutaneous Q4H  . insulin aspart  3 Units Subcutaneous Q4H  . insulin glargine  25 Units Subcutaneous Daily  . labetalol  200 mg Per Tube TID  . mouth rinse  15 mL Mouth Rinse QID  . metoCLOPramide (REGLAN) injection  5 mg Intravenous Q8H  . pantoprazole sodium  40 mg Per Tube Q1200  . polyethylene glycol  17 g Oral Daily  . senna-docusate  1 tablet Per Tube BID  . sodium chloride flush  3 mL Intravenous Q12H   Continuous Infusions: . sodium chloride 10 mL/hr at 11/25/17 0600  . feeding supplement (JEVITY 1.2 CAL) 30 mL/hr at 11/25/17 0600  . fluconazole (DIFLUCAN) IV Stopped (11/25/17 1431)  . vancomycin Stopped (11/25/17 1240)   PRN Meds:.sodium chloride, acetaminophen (TYLENOL) oral liquid 160 mg/5 mL **OR** acetaminophen, hydrALAZINE, iopamidol, labetalol, LORazepam, nitroGLYCERIN, ondansetron  **OR** ondansetron (ZOFRAN) IV    Anti-infectives (From admission, onward)   Start     Dose/Rate Route Frequency Ordered Stop   11/25/17 1000  vancomycin (VANCOCIN) 1,250 mg in sodium chloride 0.9 % 250 mL IVPB     1,250 mg 166.7 mL/hr over 90 Minutes Intravenous Every 24 hours 11/24/17 1449     11/24/17 1600  vancomycin (VANCOCIN) 1,250 mg in sodium chloride 0.9 % 250 mL IVPB     1,250 mg 166.7 mL/hr over 90 Minutes Intravenous  Once 11/24/17 1449 11/24/17 1734   11/23/17 1800  Ampicillin-Sulbactam (UNASYN) 3 g in sodium chloride 0.9 % 100 mL IVPB  Status:  Discontinued     3 g 200 mL/hr over 30 Minutes Intravenous Every 6 hours 11/23/17 1549 11/24/17 1437   11/23/17 1100  fluconazole (DIFLUCAN) IVPB 200 mg     200 mg 100 mL/hr over 60 Minutes Intravenous Every 24 hours 11/23/17 0926 11/30/17 1059   11/15/17 2245  fluconazole (DIFLUCAN) 40 MG/ML suspension 200 mg     200 mg Per Tube Daily 11/15/17 2243 11/17/17 0931   11/10/17  1400  cefTRIAXone (ROCEPHIN) 2 g in sodium chloride 0.9 % 100 mL IVPB  Status:  Discontinued     2 g 200 mL/hr over 30 Minutes Intravenous Every 24 hours 11/10/17 1002 11/15/17 0849   11/08/17 1000  vancomycin (VANCOCIN) IVPB 1000 mg/200 mL premix  Status:  Discontinued     1,000 mg 200 mL/hr over 60 Minutes Intravenous Every 24 hours 11/08/17 0409 11/10/17 0958   11/08/17 0400  piperacillin-tazobactam (ZOSYN) IVPB 3.375 g  Status:  Discontinued     3.375 g 12.5 mL/hr over 240 Minutes Intravenous Every 8 hours 11/07/17 2325 11/10/17 1001   11/07/17 1845  piperacillin-tazobactam (ZOSYN) IVPB 3.375 g     3.375 g 100 mL/hr over 30 Minutes Intravenous STAT 11/07/17 1835 11/07/17 2131   11/07/17 1830  vancomycin (VANCOCIN) IVPB 1000 mg/200 mL premix     1,000 mg 200 mL/hr over 60 Minutes Intravenous  Once 11/07/17 1828 11/07/17 2345   11/07/17 1830  piperacillin-tazobactam (ZOSYN) IVPB 2.25 g  Status:  Discontinued     2.25 g 100 mL/hr over 30 Minutes  Intravenous Every 6 hours 11/07/17 1828 11/07/17 1835        Objective:   Vitals:   11/25/17 1300 11/25/17 1400 11/25/17 1536 11/25/17 1600  BP: (!) 135/55 (!) 147/62    Pulse:      Resp: (!) 26 (!) 26    Temp:    99.4 F (37.4 C)  TempSrc:    Axillary  SpO2:   99%   Weight:      Height:        Wt Readings from Last 3 Encounters:  11/25/17 85.1 kg (187 lb 9.8 oz)     Intake/Output Summary (Last 24 hours) at 11/25/2017 1700 Last data filed at 11/25/2017 1429 Gross per 24 hour  Intake 1173 ml  Output 800 ml  Net 373 ml    Physical Exam  Gen:- Awake Alert, to follow commands HEENT:- feeding tube in Nare Neck- trach with trach collar  lungs-scattered rhonchi bilaterally and movement is fairly symmetrical  CV- S1, S2 normal Abd-  +ve B.Sounds, Abd Soft, No tenderness,    Extremity/Skin:-Skin is warm and dry  psych-affect is appropriate, able to follow commands  neuro-left hemiplegia   Data Review:   Micro Results Recent Results (from the past 240 hour(s))  Culture, blood (Routine X 2) w Reflex to ID Panel     Status: None (Preliminary result)   Collection Time: 11/23/17 10:33 AM  Result Value Ref Range Status   Specimen Description   Final    BLOOD RIGHT HAND Performed at [facility] [address] [place], [place] [address]    Special Requests   Final    BOTTLES DRAWN AEROBIC AND ANAEROBIC Blood Culture adequate volume Performed at [facility] [address], [place], [place] [address]    Culture   Final    NO GROWTH 2 DAYS Performed at [facility] [address], [place], [place] [address]    Report Status PENDING  Incomplete  Urine Culture     Status: None   Collection Time: 11/23/17 10:41 AM  Result Value Ref Range Status   Specimen Description   Final    URINE, CATHETERIZED Performed at [facility] [address], [place], [place] [address]    Special Requests   Final     NONE Performed at [facility], [place] [address] [place], [place] [address]  Culture   Final    NO GROWTH Performed at [facility], [place] [address], [place], [place] [address]    Report Status 11/24/2017 FINAL  Final  Culture, blood (Routine X 2) w Reflex to ID Panel     Status: Abnormal (Preliminary result)   Collection Time: 11/23/17 11:11 AM  Result Value Ref Range Status   Specimen Description   Final    BLOOD RIGHT HAND Performed at [facility] [address], [place], [place] [address]    Special Requests   Final    BOTTLES DRAWN AEROBIC AND ANAEROBIC Blood Culture adequate volume Performed at [facility] [address], [place], [place] [address]    Culture  Setup Time   Final    GRAM POSITIVE COCCI IN BOTH AEROBIC AND ANAEROBIC BOTTLES Organism ID to follow CRITICAL RESULT CALLED TO, READ BACK BY AND VERIFIED WITH: Guadlupe Spanish PharmD 14:25 11/24/17 ([email or web address])    Culture (A)  Final    STAPHYLOCOCCUS SPECIES (COAGULASE NEGATIVE) THE SIGNIFICANCE OF ISOLATING THIS ORGANISM FROM A SINGLE SET OF BLOOD CULTURES WHEN MULTIPLE SETS ARE DRAWN IS UNCERTAIN. PLEASE NOTIFY THE MICROBIOLOGY DEPARTMENT WITHIN ONE WEEK IF SPECIATION AND SENSITIVITIES ARE REQUIRED. Performed at [facility], [place] [address] [place], [place] [address]    Report Status PENDING  Incomplete  Blood Culture ID Panel (Reflexed)     Status: Abnormal   Collection Time: 11/23/17 11:11 AM  Result Value Ref Range Status   Enterococcus species NOT DETECTED NOT DETECTED Final   Listeria monocytogenes NOT DETECTED NOT DETECTED Final   Staphylococcus species DETECTED (A) NOT DETECTED Final    Comment: Methicillin (oxacillin) resistant coagulase negative staphylococcus. Possible blood culture contaminant (unless isolated from more than one blood culture draw or clinical case suggests pathogenicity). No antibiotic treatment is indicated for blood   culture contaminants. CRITICAL RESULT CALLED TO, READ BACK BY AND VERIFIED WITH: Guadlupe Spanish PharmD 14:25 11/24/17 ([email or web address])    Staphylococcus aureus NOT DETECTED NOT DETECTED Final   Methicillin resistance DETECTED (A) NOT DETECTED Final    Comment: CRITICAL RESULT CALLED TO, READ BACK BY AND VERIFIED WITH: Guadlupe Spanish PharmD 14:25 11/24/17 ([email or web address])    Streptococcus species NOT DETECTED NOT DETECTED Final   Streptococcus agalactiae NOT DETECTED NOT DETECTED Final   Streptococcus pneumoniae NOT DETECTED NOT DETECTED Final   Streptococcus pyogenes NOT DETECTED NOT DETECTED Final   Acinetobacter baumannii NOT DETECTED NOT DETECTED Final   Enterobacteriaceae species NOT DETECTED NOT DETECTED Final   Enterobacter cloacae complex NOT DETECTED NOT DETECTED Final   Escherichia coli NOT DETECTED NOT DETECTED Final   Klebsiella oxytoca NOT DETECTED NOT DETECTED Final   Klebsiella pneumoniae NOT DETECTED NOT DETECTED Final   Proteus species NOT DETECTED NOT DETECTED Final   Serratia marcescens NOT DETECTED NOT DETECTED Final   Haemophilus influenzae NOT DETECTED NOT DETECTED Final   Neisseria meningitidis NOT DETECTED NOT DETECTED Final   Pseudomonas aeruginosa NOT DETECTED NOT DETECTED Final   Candida albicans NOT DETECTED NOT DETECTED Final   Candida glabrata NOT DETECTED NOT DETECTED Final   Candida krusei NOT DETECTED NOT DETECTED Final   Candida parapsilosis NOT DETECTED NOT DETECTED Final   Candida tropicalis NOT DETECTED NOT DETECTED Final    Comment: Performed at [facility], [place] [address] [place], [place] [address]  Culture, blood (routine x 2)     Status: None (Preliminary result)   Collection Time: 11/25/17  5:38 AM  Result Value Ref Range Status   Specimen Description   Final    BLOOD LEFT HAND Performed at [facility] [address], [place], [place] [address]    Special Requests   Final    BOTTLES DRAWN AEROBIC AND ANAEROBIC Blood Culture  adequate volume Performed at [facility], [place] [address], [place], [place] [address]    Culture PENDING  Incomplete   Report Status PENDING  Incomplete    Radiology Reports Ct Abdomen Pelvis Wo Contrast  Result Date: 11/07/2017 CLINICAL DATA:  Abdominal pain EXAM: CT ABDOMEN AND PELVIS WITHOUT CONTRAST TECHNIQUE: Multidetector CT imaging of the abdomen and pelvis was performed following the standard protocol without IV contrast. The examination was ordered as a CT angiography study, but there was infiltration during the contrast injection. COMPARISON:  None. FINDINGS: Lower chest: Coronary artery calcifications.  No pleural effusion. Hepatobiliary: Normal hepatic contours and density. No intra- or extrahepatic biliary dilatation. Cholelithiasis without acute inflammation. Pancreas: Normal parenchymal contours without ductal dilatation. No peripancreatic fluid collection. Spleen: Small spleen. Adrenals/Urinary Tract: --Adrenal glands: Normal. --Right kidney/ureter: Suspected excretion of contrast. Otherwise normal kidneys. --Left kidney/ureter: Suspected excretion of contrast, otherwise normal kidneys. --Urinary bladder: Small amount of excreted contrast in the urinary bladder. Stomach/Bowel: --Stomach/Duodenum: No hiatal hernia or other gastric abnormality. Normal duodenal course. --Small bowel: No dilatation or inflammation. --Colon: No focal abnormality. --Appendix: Normal. Vascular/Lymphatic: Atherosclerotic calcification is present within the non-aneurysmal abdominal aorta, without hemodynamically significant stenosis. No abdominal or pelvic lymphadenopathy. Reproductive: Status post hysterectomy. No adnexal mass. Musculoskeletal. No bony spinal canal stenosis or focal osseous abnormality. Other: None. IMPRESSION: 1. No acute abnormality of the abdomen or pelvis. 2. Aortic Atherosclerosis (ICD10-I70.0). Coronary artery atherosclerosis. Electronically Signed   By: Ulyses Jarred M.D.   On:  11/07/2017 22:41   Dg Chest 1 View  Result Date: 11/09/2017 CLINICAL DATA:  Endotracheal tube placement EXAM: CHEST  1 VIEW COMPARISON:  11/09/2017 FINDINGS: Endotracheal tube remains in good position. Gastric tube in the stomach Left lower lobe consolidation unchanged from earlier today. Right lung clear. Mild hypoventilation with decreased lung volume IMPRESSION: Endotracheal tube in good position. Left lower lobe atelectasis/infiltrate unchanged. Electronically Signed   By: Franchot Gallo M.D.   On: 11/09/2017 19:03   Dg Chest 2 View  Result Date: 11/07/2017 CLINICAL DATA:  Abdominal pain and shortness of Breath EXAM: CHEST - 2 VIEW COMPARISON:  None. FINDINGS: Cardiac shadow is mildly enlarged. The lungs are well aerated bilaterally. No sizable effusion is seen. Downward displacement of the left humeral head is noted. This is likely related to some chronic subluxation. Clinical correlation is recommended. IMPRESSION: No acute abnormality in the chest. Downward displacement of the left humeral head likely related to subluxation. Correlate with the physical exam. Electronically Signed   By: Inez Catalina M.D.   On: 11/07/2017 19:08   Dg Abd 1 View  Result Date: 11/24/2017 CLINICAL DATA:  NG tube placement EXAM: ABDOMEN - 1 VIEW COMPARISON:  11/19/2017 FINDINGS: Esophageal tube tip overlies the gastric body, side-port overlies the proximal stomach. Slight increased bowel gas within the abdomen without definitive obstructive pattern. IMPRESSION: Esophageal tube tip overlies the gastric body region. Electronically Signed   By: Donavan Foil M.D.   On: 11/24/2017 19:29   Dg Abd 1 View  Result Date: 11/19/2017 CLINICAL DATA:  62 y/o  F; NG tube placement. EXAM: ABDOMEN - 1 VIEW COMPARISON:  11/19/2017 abdomen radiograph. FINDINGS: Normal bowel gas pattern. Enteric tube tip  projects over gastric body. Mild levocurvature of the thoracolumbar junction. Bones are otherwise unremarkable. Surgical clips project  over left hemiabdomen IMPRESSION: Enteric tube tip projects over gastric body. Electronically Signed   By: Kristine Garbe M.D.   On: 11/19/2017 22:22   Dg Abd 1 View  Result Date: 11/19/2017 CLINICAL DATA:  Patient pulled NG tube, NG tube placement EXAM: ABDOMEN - 1 VIEW COMPARISON:  11/19/2017 at 1312 hours FINDINGS: Nasogastric tube passes below the diaphragm to lie within the mid stomach. Multiple gallstones and right upper quadrant. Bowel gas pattern is unremarkable. IMPRESSION: Well-positioned nasogastric tube. Electronically Signed   By: Lajean Manes M.D.   On: 11/19/2017 16:47   Dg Abd 1 View  Result Date: 11/19/2017 CLINICAL DATA:  Check nasogastric catheter placement EXAM: ABDOMEN - 1 VIEW COMPARISON:  11/19/2017 FINDINGS: Nasogastric catheter is again coiled upon itself but appears to be at least partially across the gastroesophageal junction. This should be advanced several cm. Multiple gallstones are again seen. Scattered nonobstructive bowel gas pattern is noted. IMPRESSION: Nasogastric catheter folded upon itself distally but appears to be at least partially in the stomach. This should be advanced several cm. Electronically Signed   By: Inez Catalina M.D.   On: 11/19/2017 14:05   Dg Abd 1 View  Result Date: 11/19/2017 CLINICAL DATA:  Evaluate nasogastric tube placement. EXAM: ABDOMEN - 1 VIEW COMPARISON:  11/18/2017.  CT 11/07/2017 FINDINGS: The nasogastric tube appears to be coiled up near the GE junction. The tube has been pulled back since the previous examination. Nonobstructive bowel gas pattern with gas in the transverse colon. IMPRESSION: Nasogastric tube has been pulled back compared to the previous examination. The tube appears to be coiled up near the GE junction. Electronically Signed   By: Markus Daft M.D.   On: 11/19/2017 10:22   Dg Abd 1 View  Result Date: 11/18/2017 CLINICAL DATA:  NG tube placement EXAM: ABDOMEN - 1 VIEW COMPARISON:  11/18/2017 FINDINGS: Esophageal  tube tip overlies the expected location of gastric body. Right upper quadrant calcifications corresponding to gallstones. Visible upper gas pattern nonobstructed. IMPRESSION: Esophageal tube tip overlies expected location of gastric body. Electronically Signed   By: Donavan Foil M.D.   On: 11/18/2017 20:56   Dg Abd 1 View  Result Date: 11/18/2017 CLINICAL DATA:  Encounter for nasogastric tube placement. EXAM: ABDOMEN - 1 VIEW COMPARISON:  Radiograph of November 18, 2017. FINDINGS: The bowel gas pattern is normal. Distal tip of nasogastric tube is seen in expected position of proximal stomach. Surgical clips are noted in the abdomen. Cholelithiasis is noted. IMPRESSION: Distal tip of nasogastric tube seen in expected position of proximal stomach. No evidence of bowel obstruction or ileus. Electronically Signed   By: Marijo Conception, M.D.   On: 11/18/2017 12:19   Dg Abd 1 View  Result Date: 11/18/2017 CLINICAL DATA:  Nasogastric tube placement EXAM: ABDOMEN - 1 VIEW COMPARISON:  Portable exam 1013 hours compared to 11/17/2017 FINDINGS: Tip of nasogastric tube projects over the distal esophagus, recommend advancing tube 12 cm into stomach. Bowel gas pattern normal. Calcified gallstones in  RIGHT upper quadrant. LEFT mid abdomen surgical clips. No acute osseous findings. IMPRESSION: Tip of nasogastric tube is at the distal esophagus; recommend advancing tube 12 cm. Cholelithiasis. Electronically Signed   By: Lavonia Dana M.D.   On: 11/18/2017 10:53   Dg Abd 1 View  Result Date: 11/17/2017 CLINICAL DATA:  Nasogastric tube placement. EXAM: ABDOMEN - 1 VIEW COMPARISON:  Radiograph of November 16, 2017. FINDINGS: The bowel gas pattern is normal. Distal tip of nasogastric tube is seen in expected position of distal stomach. IMPRESSION: No evidence of bowel obstruction or ileus. Distal tip of nasogastric tube seen in expected position of distal stomach. Electronically Signed   By: Marijo Conception, M.D.   On: 11/17/2017 19:01    Dg Abd 1 View  Result Date: 11/14/2017 CLINICAL DATA:  Tracheostomy patient.  Nasogastric tube placement. EXAM: ABDOMEN - 1 VIEW COMPARISON:  Two days ago FINDINGS: Nasogastric tube tip overlaps the mid stomach. Diffuse bowel gas without obstructive pattern. No concerning mass effect or gas collection. Mild left base atelectasis. IMPRESSION: Nasogastric tube tip over the mid stomach. Electronically Signed   By: Monte Fantasia M.D.   On: 11/14/2017 14:39   Dg Abd 1 View  Result Date: 11/12/2017 CLINICAL DATA:  NG tube placement. EXAM: ABDOMEN - 1 VIEW COMPARISON:  11/09/2017 FINDINGS: An enteric tube terminates over the distal stomach, slightly more distal than on the prior study. Surgical clips are present in the mid abdomen. There is mild gaseous distension of predominantly large bowel loops in the included portion of the abdomen. No dilated loops of bowel suggestive of mechanical obstruction are identified. No intraperitoneal free air is identified on this supine study. No acute osseous abnormality is seen. IMPRESSION: Enteric tube in the distal stomach. Electronically Signed   By: Logan Bores M.D.   On: 11/12/2017 14:14   Dg Abd 1 View  Result Date: 11/09/2017 CLINICAL DATA:  Orogastric tube placement EXAM: ABDOMEN - 1 VIEW COMPARISON:  11/08/2017 FINDINGS: Gastric tube in the stomach with the tip in the body the stomach. Normal bowel gas pattern. Negative for bowel obstruction. IMPRESSION: Gastric tube body of stomach.  Normal bowel gas pattern. Electronically Signed   By: Franchot Gallo M.D.   On: 11/09/2017 19:02   Dg Abd 1 View  Result Date: 11/08/2017 CLINICAL DATA:  Orogastric tube placement. EXAM: ABDOMEN - 1 VIEW COMPARISON:  None. FINDINGS: The orogastric tube extends well below the diaphragm. Tip projects in the distal stomach. IMPRESSION: Well-positioned orogastric tube, tip projecting in the distal stomach. Electronically Signed   By: Lajean Manes M.D.   On: 11/08/2017 17:07    Ct Head Wo Contrast  Result Date: 11/08/2017 CLINICAL DATA:  62 year old female with altered mental status. EXAM: CT HEAD WITHOUT CONTRAST TECHNIQUE: Contiguous axial images were obtained from the base of the skull through the vertex without intravenous contrast. COMPARISON:  None. FINDINGS: Evaluation of this exam is limited due to motion artifact. Brain: There is mild age-related atrophy and chronic microvascular ischemic changes. There is no acute intracranial hemorrhage. No mass effect or midline shift. No extra-axial fluid collection. Vascular: Atherosclerotic calcification of the cerebral vasculature. Skull: Normal. Negative for fracture or focal lesion. Sinuses/Orbits: No acute finding. Other: None IMPRESSION: No definite acute intracranial hemorrhage on this severely motion degraded exam. Electronically Signed   By: Anner Crete M.D.   On: 11/08/2017 02:35   Mr Brain Wo Contrast  Result Date: 11/09/2017 CLINICAL DATA:  Initial evaluation for acute encephalopathy. Evaluate for new stroke. Left-sided weakness. EXAM: MRI HEAD WITHOUT CONTRAST TECHNIQUE: Multiplanar, multiecho pulse sequences of the brain and surrounding structures were obtained without intravenous contrast. COMPARISON:  Comparison made with prior CT from 11/08/2017. FINDINGS: Brain: Generalized age-related cerebral atrophy, with more pronounced cerebellar atrophy noted. Patchy T2/FLAIR hyperintensity within the periventricular and deep white matter both cerebral hemispheres, most consistent with chronic small  vessel ischemic disease, mild for age. Superimposed remote lacunar infarcts present within the right basal ganglia and left thalamus as well as the pons. No abnormal foci of restricted diffusion to suggest acute or subacute ischemia. Gray-white matter differentiation maintained. No other is of chronic infarction. No acute or chronic intracranial hemorrhage. No mass lesion, midline shift or mass effect. No hydrocephalus. No  extra-axial fluid or scar. Major dural sinuses are patent. Pituitary gland suprasellar region normal. Vascular: Major intracranial vascular flow voids are maintained. Skull and upper cervical spine: Craniocervical junction within normal limits. Upper cervical spine demonstrates mild degenerative spondylolysis without significant stenosis. Bone marrow signal intensity within normal limits. Mild dilatation of Foley noted. Scalp soft tissues within normal limits. Sinuses/Orbits: Globes orbital soft tissues within normal limits. Scattered mucosal thickening throughout the paranasal sinuses. Layering fluid seen within the nasopharynx. Patient is intubated. Small bilateral mastoid effusions noted. Inner ear structures grossly normal. Other: None. IMPRESSION: 1. No acute intracranial infarct or other abnormality identified. 2. Mild chronic small vessel ischemic disease with small remote lacunar infarcts involving the right basal ganglia, left thalamus, and pons. Electronically Signed   By: Jeannine Boga M.D.   On: 11/09/2017 21:43   Dg Chest Port 1 View  Result Date: 11/24/2017 CLINICAL DATA:  Respiratory failure EXAM: PORTABLE CHEST 1 VIEW COMPARISON:  11/23/2017 FINDINGS: Cardiac shadow is mildly enlarged but stable. Tracheostomy tube and nasogastric catheter are again seen and stable. Lungs are well aerated bilaterally without focal infiltrate or sizable effusion. IMPRESSION: No acute abnormality noted. Electronically Signed   By: Inez Catalina M.D.   On: 11/24/2017 07:29   Dg Chest Port 1 View  Result Date: 11/23/2017 CLINICAL DATA:  Acute respiratory failure, tracheostomy patient, sepsis, acute encephalopathy, former smoker. EXAM: PORTABLE CHEST 1 VIEW COMPARISON:  Portable chest x-ray of November 18, 2017 FINDINGS: The lungs are well-expanded. The interstitial markings in the right lung are mildly increased overall as compared to the left. There is no significant pleural effusion and no pneumothorax. The  cardiac silhouette remains enlarged. The pulmonary vascularity is mildly engorged. The tracheostomy tube tip projects between the clavicular heads. The esophagogastric tube tip in proximal port project below the GE junction. IMPRESSION: Mild overall increase in the prominence of the pulmonary interstitium on the right. This may reflect asymmetric pulmonary edema of cardiac or noncardiac cause. A diffuse interstitial pneumonia is felt less likely. There is no alveolar pneumonia on the right or left. Mild cardiomegaly with increased pulmonary vascular prominence. The support tubes are in reasonable position. Electronically Signed   By: David  Martinique M.D.   On: 11/23/2017 07:16   Dg Chest Port 1 View  Result Date: 11/18/2017 CLINICAL DATA:  Possible UTI. EXAM: PORTABLE CHEST 1 VIEW COMPARISON:  11/15/2017. FINDINGS: Tracheostomy tube noted with tip over the trachea. Interval removal of NG tube. Cardiomegaly with normal pulmonary vascularity. No focal infiltrate. No pleural effusion or pneumothorax. Degenerative change thoracic spine. IMPRESSION: 1. Interval removal of NG tube. Tracheostomy tube noted with tip over the trachea. 2. Cardiomegaly with normal pulmonary vascularity. No acute pulmonary disease noted. Electronically Signed   By: Marcello Moores  Register   On: 11/18/2017 09:41   Dg Chest Port 1 View  Result Date: 11/15/2017 CLINICAL DATA:  62 year old female admitted last week with sepsis. EXAM: PORTABLE CHEST 1 VIEW COMPARISON:  11/13/2017 and earlier. FINDINGS: Portable AP semi upright view at 0857 hours. Stable tracheostomy tube. Enteric tube courses to the left abdomen, tip not included. Mildly larger lung volumes.  Decreased streaky opacity at the lung bases, residual greater on the left. No pneumothorax, pulmonary edema, pleural effusion or consolidation. Mediastinal contours are stable and within normal limits. Negative visible bowel gas pattern. No acute osseous abnormality identified. IMPRESSION: 1.   Stable lines and tubes. 2. Mild atelectasis.  No other acute cardiopulmonary abnormality. Electronically Signed   By: Genevie [name] M.D.   On: 11/15/2017 09:16   Dg Chest Port 1 View  Result Date: 11/13/2017 CLINICAL DATA:  Tracheostomy tube EXAM: PORTABLE CHEST 1 VIEW COMPARISON:  Yesterday FINDINGS: Tracheostomy tube in place. New nasogastric tube which reaches the stomach. There is a low volume chest with streaky opacities favoring atelectasis. No Kerley lines, effusion, or pneumothorax. Cardiopericardial enlargement. IMPRESSION: 1. Lower lung volumes with increased atelectasis. 2. Cardiomegaly. Electronically Signed   By: Monte Fantasia M.D.   On: 11/13/2017 07:48   Dg Chest Port 1 View  Result Date: 11/12/2017 CLINICAL DATA:  Intermittent chest pain, recent leg fracture EXAM: PORTABLE CHEST 1 VIEW COMPARISON:  11/11/2017 FINDINGS: Interval tracheostomy tube placement in satisfactory position. Interval removal of the nasogastric tube. Right lower lobe airspace disease which may reflect atelectasis versus pneumonia. No pleural effusion. No pneumothorax. Stable cardiomegaly. No acute osseous abnormality. Mild osteoarthritis of bilateral glenohumeral joints. IMPRESSION: Interval tracheostomy tube placement in satisfactory position. Interval removal of the nasogastric tube. Right lower lobe airspace disease which may reflect atelectasis versus pneumonia. Electronically Signed   By: Kathreen Devoid   On: 11/12/2017 13:07   Dg Chest Port 1 View  Result Date: 11/11/2017 CLINICAL DATA:  Endotracheal tube position EXAM: PORTABLE CHEST 1 VIEW COMPARISON:  11/10/2017 FINDINGS: Endotracheal tube in good position.  Gastric tube in the stomach Bibasilar airspace disease left greater than right unchanged. No significant effusion. No pneumothorax. IMPRESSION: Bibasilar airspace disease left greater than right unchanged. Endotracheal tube in good position. Electronically Signed   By: Franchot Gallo M.D.   On: 11/11/2017  07:16   Dg Chest Port 1 View  Result Date: 11/10/2017 CLINICAL DATA:  ETT position EXAM: PORTABLE CHEST 1 VIEW COMPARISON:  Chest x-rays dated 11/09/2017 and 11/08/2017. FINDINGS: Endotracheal tube is well positioned with tip approximately 3 cm above the level of the carina. Enteric tube passes below the diaphragm. Left lower lobe opacities are stable, presumed atelectasis and/or small effusion. No new lung findings. No pneumothorax seen. IMPRESSION: 1. Endotracheal tube well positioned with tip approximately 3 cm above the carina. 2. Persistent left lower lobe opacities, stable, probable atelectasis and/or small pleural effusion. No new lung findings. Electronically Signed   By: Franki Cabot M.D.   On: 11/10/2017 09:16   Dg Chest Port 1 View  Result Date: 11/09/2017 CLINICAL DATA:  Hypoxia EXAM: PORTABLE CHEST 1 VIEW COMPARISON:  November 08, 2017 FINDINGS: Endotracheal tube tip is 3.6 cm above the carina. Nasogastric tube tip and side port are below the diaphragm. No pneumothorax. There is airspace consolidation in the left base. Lungs elsewhere are clear. There is cardiomegaly with pulmonary vascularity within normal limits. No adenopathy. No evident bone lesions. IMPRESSION: Tube positions as described without pneumothorax. Patchy consolidation left base. Lungs elsewhere clear. Stable cardiomegaly. Electronically Signed   By: Lowella Grip III M.D.   On: 11/09/2017 08:02   Dg Chest Port 1 View  Result Date: 11/08/2017 CLINICAL DATA:  Status post ET tube placement. EXAM: PORTABLE CHEST 1 VIEW COMPARISON:  Portable film earlier in the day. FINDINGS: Cardiac enlargement. ET tube has been inserted, and lies 3.7 cm above  carina. Nasogastric tube is coiled in the stomach. Aeration is improved with partial clearing of edema. No consolidation. IMPRESSION: Improved aeration post ET tube placement. Electronically Signed   By: Staci Righter M.D.   On: 11/08/2017 17:05   Dg Chest Port 1 View  Result Date:  11/08/2017 CLINICAL DATA:  62 year old female with hypoxia. EXAM: PORTABLE CHEST 1 VIEW COMPARISON:  Chest radiograph dated 11/07/2017 FINDINGS: Shallow inspiration with minimal bibasilar atelectasis. No focal consolidation, pleural effusion, or pneumothorax. There is borderline cardiomegaly with mild central vascular prominence which may represent mild vascular congestion. Chronic subluxed appearance of the left shoulder. No acute osseous pathology. IMPRESSION: Probable mild vascular congestion.  No focal consolidation. Electronically Signed   By: Anner Crete M.D.   On: 11/08/2017 05:20   Dg Abd 2 Views  Result Date: 11/07/2017 CLINICAL DATA:  Diffuse abdominal pain EXAM: ABDOMEN - 2 VIEW COMPARISON:  None. FINDINGS: Scattered large and small bowel gas is noted. Mild retained fecal material is seen. No obstructive changes are noted. No free air is seen. No acute bony abnormality is noted. IMPRESSION: No acute abnormality seen. Electronically Signed   By: Inez Catalina M.D.   On: 11/07/2017 19:10   Dg Abd Portable 1v  Result Date: 11/16/2017 CLINICAL DATA:  Tachycardia.  Vomiting. EXAM: PORTABLE ABDOMEN - 1 VIEW COMPARISON:  11/14/2017. FINDINGS: Gastric tube remains within the stomach. There is moderate gaseous distention of small and large bowel. Distal colonic gas appears to be present. Query LEFT lung base consolidation or atelectasis. IMPRESSION: Moderate gaseous distension appears slightly increased from priors. Query ileus. NG tube in stomach. Electronically Signed   By: Staci Righter M.D.   On: 11/16/2017 09:25     CBC Recent Labs  Lab 11/19/17 0314 11/20/17 0324 11/23/17 0309 11/24/17 0814 11/25/17 0526  WBC 10.2 9.6 10.9* 9.7 7.3  HGB 8.7* 8.9* 9.2* 9.1* 8.8*  HCT 28.6* 30.0* 30.1* 29.4* 28.7*  PLT 459* 480* 563* 570* 572*  MCV 91.1 92.0 89.6 88.6 89.7  MCH 27.7 27.3 27.4 27.4 27.5  MCHC 30.4 29.7* 30.6 31.0 30.7  RDW 15.6* 15.5 14.9 14.8 14.9  LYMPHSABS 2.2  --   --   --   --    MONOABS 1.5*  --   --   --   --   EOSABS 0.2  --   --   --   --   BASOSABS 0.0  --   --   --   --     Chemistries  Recent Labs  Lab 11/19/17 0314 11/23/17 0309 11/24/17 0814 11/25/17 0526  NA 141 139 135 137  K 3.6 4.3 4.1 4.3  CL 103 102 97* 98*  CO2 29 27 26 30   GLUCOSE 145* 217* 214* 139*  BUN 22* 12 14 11   CREATININE 0.83 0.74 0.81 0.74  CALCIUM 9.6 9.3 9.1 9.3  MG  --  2.0  --   --    ------------------------------------------------------------------------------------------------------------------ No results for input(s): CHOL, HDL, LDLCALC, TRIG, CHOLHDL, LDLDIRECT in the last 72 hours.  Lab Results  Component Value Date   HGBA1C 6.7 (H) 11/08/2017   ------------------------------------------------------------------------------------------------------------------ No results for input(s): TSH, T4TOTAL, T3FREE, THYROIDAB in the last 72 hours.  Invalid input(s): FREET3 ------------------------------------------------------------------------------------------------------------------ No results for input(s): VITAMINB12, FOLATE, FERRITIN, TIBC, IRON, RETICCTPCT in the last 72 hours.  Coagulation profile No results for input(s): INR, PROTIME in the last 168 hours.  No results for input(s): DDIMER in the last 72 hours.  Cardiac Enzymes No  results for input(s): CKMB, TROPONINI, MYOGLOBIN in the last 168 hours.  Invalid input(s): CK ------------------------------------------------------------------------------------------------------------------ No results found for: BNP   Roxan Hockey M.D on 11/25/2017 at 5:00 PM  Between 7am to 7pm - Pager - [phone]  After 7pm go to [email or web address] - password [email or web address]  [facility] -  Office  [phone]   Voice Recognition Viviann Spare dictation system was used to create this note, attempts have been made to correct errors. Please contact the author with questions and/or clarifications.

## 2017-11-26 ENCOUNTER — Inpatient Hospital Stay (HOSPITAL_COMMUNITY): Payer: Medicaid Other

## 2017-11-26 DIAGNOSIS — J9601 Acute respiratory failure with hypoxia: Secondary | ICD-10-CM

## 2017-11-26 DIAGNOSIS — R0902 Hypoxemia: Secondary | ICD-10-CM

## 2017-11-26 LAB — GLUCOSE, CAPILLARY
GLUCOSE-CAPILLARY: 79 mg/dL (ref 65–99)
GLUCOSE-CAPILLARY: 131 mg/dL — AB (ref 65–99)
GLUCOSE-CAPILLARY: 89 mg/dL (ref 65–99)
Glucose-Capillary: 102 mg/dL — ABNORMAL HIGH (ref 65–99)
Glucose-Capillary: 113 mg/dL — ABNORMAL HIGH (ref 65–99)
Glucose-Capillary: 129 mg/dL — ABNORMAL HIGH (ref 65–99)

## 2017-11-26 LAB — PROCALCITONIN: Procalcitonin: <0.1 ng/mL

## 2017-11-26 MED ORDER — RESOURCE THICKENUP CLEAR PO POWD
ORAL | Status: DC | PRN
Start: 2017-11-26 — End: 2017-11-30
  Filled 2017-11-26: qty 125

## 2017-11-26 MED ORDER — CHLORHEXIDINE GLUCONATE 0.12 % MT SOLN
OROMUCOSAL | Status: AC
  Administered 2017-11-26: 15 mL
  Filled 2017-11-26: qty 15

## 2017-11-26 NOTE — Progress Notes (Signed)
  Speech Language Pathology Treatment: Dysphagia;Passy Muir Speaking valve  Patient Details Name: Grace LefevreGwendolyn [name] MRN: [id number] DOB: November 19, 1955 Today's Date: 11/26/2017 Time: [id number] SLP Time Calculation (min) (ACUTE ONLY): 10 min  Assessment / Plan / Recommendation Clinical Impression  Pt tolerating PMSV well but recommend continue full supervision. All vitals remained stable without breathing stacking for 10 minute trial.   PO Trials provided due to pt pulling out her NG tube.  No overt indication of aspiration with applesauce or ice chip bolus.   Pt reports desire to eat repeatedly to this SLP and is able to follow directions well to cough on command and expectorate, boding well for potential po intake.  Will proceed with MBS, continue PMSV with full supervision.  Pt making excellent progress!           HPI HPI: Grace LefevreGwendolyn Poppell 62 year old female with past medical history of CVA with left hemiparesis, hypertension and CAD who presented to the emergency department with fevers and elevated blood pressure.  She was admitted on 3/24 after being found encephalopathic and septic due to UTI.  On 3/25 she had acute changes on mental status and PCCM was consulted. Patient was intubated for airway protection and worsening HCRF, patient self extubated on 3/26, failed with continued hypoxia and was remains intubated due to ongoing AMS.  Patient underwent trach 3/29.  Patient completed treatment for UTI per MD notes.  Patient developed possible ileus due to poor mobility.  Patient now on trach support with vent at night.  PMSV ordered.        SLP Plan  Continue with current plan of care       Recommendations  Compensations: Slow rate;Small sips/bites;Other (Comment)(allow time for intermittent dry swallow, pmsv in place for po, cough and expectorate secretions with valve in place)      Patient may use Passy-Muir Speech Valve: Intermittently with supervision(for short amount of time with  full supervision) PMSV Supervision: Full         Oral Care Recommendations: Oral care QID Follow up Recommendations: Skilled Nursing facility SLP Visit Diagnosis: Aphonia (R49.1) Plan: Continue with current plan of care       GO                Donavan Burnetamara Rebecah Dangerfield, MS [facility] SLP [phone]

## 2017-11-26 NOTE — Progress Notes (Signed)
Pt tolerating PMSV well but recommend continue full supervision.  Note she pulled out her NG again and can not have PEG today due to blood counts.  Recommend while NG out to proceed with MBS to determine if any po could be tolerated. Pt reports desire to eat repeatedly to this SLP and is able to follow directions well to cough on command and expectorate.   Donavan Burnetamara Mckinna Demars, MS [facility] SLP [phone]

## 2017-11-26 NOTE — Progress Notes (Addendum)
Pharmacy Antibiotic Note  Grace LefevreGwendolyn [name] is a 62 y.o. female admitted on 11/07/2017 with sepsis. Gram positive cocci (BCID = MR-CoNS) growing in blood cultures 1/2 sets (on both aerobic and anaerobic bottles on the one set) from 11/23/2017.  Due to clinical picture, decision made to treat, and pharmacy consulted for vancomycin dosing. Today, received call from micro lab regarding aerobic and anaerobic bottle of 1/2 sets of blood cultures from 4/11 also growing gram positive cocci (per Micro lab, BCID will not be run on this particular specimen as suspected it is same organism as from 11/23/2017). Notified Dr. Mariea ClontsEmokpae of positive blood cultures. Per MD, will continue Vancomycin for now and plans to repeat blood cultures tomorrow. If tomorrow's cultures return positive, further workup will be pursued.   Plan:  Continue Vancomycin 1250mg  IV q24h  Check SCr every other day  Monitor renal function  Follow up cultures  Check Vancomycin peak/trough levels at steady state, as indicated  Height: 5\' 4"  (162.6 cm) Weight: 187 lb 9.8 oz (85.1 kg) IBW/kg (Calculated) : 54.7  Temp (24hrs), Avg:99.4 F (37.4 C), Min:99.1 F (37.3 C), Max:99.7 F (37.6 C)  Recent Labs  Lab 11/20/17 0324 11/23/17 0309 11/24/17 0814 11/25/17 0526  WBC 9.6 10.9* 9.7 7.3  CREATININE  --  0.74 0.81 0.74    Estimated Creatinine Clearance: 76 mL/min (by C-G formula based on SCr of 0.74 mg/dL).    No Known Allergies  Antimicrobials this admission: 3/24 Zosyn >> 3/27 3/24 Vancomycin >> 3/27, 4/10 >> 3/27 Rocephin >> 4/1 4/1 Fluconazole x 3 doses >> 4/3, 4/9 >> (4/15) 4/9 Unasyn >> 4/10  Microbiology results: 3/24 BCx: NGF 3/25 UCx: >100K E.coli (S to unasyn, cefazolin, CTX, gentamicin, imipenem, nitrofurantoin, zosyn) FINAL 3/25 MRSA PCR: positive 3/25 HIV Ab: NR 4/1 Tracheal aspirate: few Candida dublinensis 4/9 BCx: GPC 1/2 sets (in both bottles on the one set) BCID = MR-CoNS 4/9 UCx: NGF  4/11 BCx: GPC  1/2 sets (in both bottles on the one set)   Thank you for allowing pharmacy to be a part of this patient's care.  Greer PickerelJigna Vuk Skillern, PharmD, BCPS Pager: [phone] 11/26/2017 2:58 PM

## 2017-11-26 NOTE — Progress Notes (Signed)
Pt. Pulled out NG tube, MD aware.

## 2017-11-26 NOTE — Progress Notes (Signed)
Modified Barium Swallow Progress Note  Patient Details  Name: Grace LefevreGwendolyn Alkire MRN: [id number] Date of Birth: [birth year]  Today's Date: 11/26/2017  Modified Barium Swallow completed.  Full report located under Chart Review in the Imaging Section.  Brief recommendations include the following:  Clinical Impression  Patient presents with moderate oropharyngeal dysphagia without aspiration.  She did demonstrate premature spillage of barium into pharynx, piecemealing and decreased timing of laryngeal closure/swallow resulting in laryngeal penetration of thin liquids.   Pharyngeal swallow fortunately is strong without residuals!  Pt did demonstrate reflexive cough with deeper penetration of thin liquids.  Recommend avoid NG tube and allow pt to have dys1/nectar diet with full supervision and pmsv in place.  Hopefully pt may be able to avoid PEG.  Using live video, educated pt to findings/recommendations.     Swallow Evaluation Recommendations       SLP Diet Recommendations: Dysphagia 1 (Puree) solids, extra gravy and sauce; Nectar thick liquid-- pt may have ice-cream   Liquid Administration via: Cup;Straw   Medication Administration: Whole meds with puree   Supervision: Full assist for feeding   Compensations: Slow rate;Small sips/bites;Other (Comment)(allow time for intermittent dry swallow, pmsv in place for po, cough and expectorate secretions with valve in place)   Postural Changes: Seated upright at 90 degrees;Remain semi-upright after after feeds/meals (Comment)   Oral Care Recommendations: Oral care QID;Oral care before and after PO   Other Recommendations: Have oral suction available(pt may have icecream!)   Donavan Burnetamara Pura Picinich, MS [facility] SLP [phone]   [name], Ruthy Forry [name] 11/26/2017,4:34 PM

## 2017-11-26 NOTE — Progress Notes (Signed)
Nutrition Follow-up  DOCUMENTATION CODES:   Obesity unspecified  INTERVENTION:  - Continue Jevity 1.2 @ 60 mL/hr with 100 mL free water every 4 hours. This regimen is providing 1728 kcal, 80 grams of protein, and 1762 mL free water.   NUTRITION DIAGNOSIS:   Inadequate oral intake related to inability to eat as evidenced by NPO status. -ongoing  GOAL:   Patient will meet greater than or equal to 90% of their needs -met with TF  MONITOR:   TF tolerance, Weight trends, Labs  ASSESSMENT:   62 yo female admitted from SNF on 3/24 with abdominal pain secondary to sepsis, AMS. PMH HTN, stroke, DM  Weight has been stable since 4/7. Pt with trach and NGT. She is receiving Jevity 1.2 @ 60 mL/hr with 100 mL free water every 4 hours and tolerating without noted issues.   SLP following pt and last saw pt on 4/10. Recommendation for MBS during this hospitalization to determine ability to provide items PO. SLP working with pt with PMV.   Per Dr. Fredonia Highland note yesterday evening, pt's husband has consented to PEG once fever has resolved. If/when PEG placed, recommend continuing current TF regimen initially and RD will monitor for ability to transition to noctural versus bolus feeds. Will also continue to monitor CBGs and insulin need to monitor for need to transition to a specialty TF formula.   Medications reviewed; 10 mg rectal Dulcolax/day, sliding scale Novolog, 3 units Novolog/day, 25 mg Lantus/day, 5 mg IV Reglan TID, 40 mg Protonix/day, 1 packet Miralax/day, 1 tablet Senokot BID.  Labs reviewed; CBGs: 129, 79, and 102 mg/dL today, Cl: 98 mmol/L.     Diet Order:  Diet NPO time specified  EDUCATION NEEDS:   Not appropriate for education at this time  Skin:  Other: excoriated marks BL arms with ecchymosis  Last BM:  4/7  Height:   Ht Readings from Last 1 Encounters:  11/11/17 5' 4" (1.626 m)    Weight:   Wt Readings from Last 1 Encounters:  11/25/17 187 lb 9.8 oz (85.1 kg)     Ideal Body Weight:  54.5 kg  BMI:  Body mass index is 32.2 kg/m.  Estimated Nutritional Needs:   Kcal:  1590-1770 (18-20 kcal/kg)  Protein:  80-90 grams  Fluid:  >=1.8 L/day      Jarome Matin, MS, RD, LDN, [facility] Inpatient Clinical Dietitian Pager # [phone] After hours/weekend pager # [phone]

## 2017-11-26 NOTE — Progress Notes (Signed)
Patient Demographics:    Grace [name], is a 62 y.o. female, DOB - 03-10-1956, IZT:[id number]  Admit date - 11/07/2017   Admitting Physician Elwin Mocha, MD  Outpatient Primary MD for the patient is Duffy Bruce, Manya Silvas, MD  LOS - 58   Chief Complaint  Patient presents with  . Abdominal Pain  . Fever        Subjective:    [name] today has no fevers, no emesis,  No chest pain, patient pulled out the nasal feeding tube, she did well with modified barium swallow, speech pathologist recommends oral feeding  Assessment  & Plan :    Principal Problem:   Acute encephalopathy Active Problems:   Sepsis ([place])   Acute respiratory failure with hypoxemia [facility])   Hypertensive emergency   Hypoxia   Brief Narrative:  Grace Bordeaux62 year old female with past medical history of CVA with left hemiparesis, hypertension and CAD who presented to the emergency department with fevers and elevated blood pressure. She was admitted on 3/24 after being found encephalopathic and septic due to UTI. On 3/25 she had acute changes on mental status and PCCM was consulted. Patient was intubated for airway protection and worsening HCRF, patient self extubated on 3/26, failed with continued hypoxia and was re-intubated due to ongoing AMS. Patient underwent trach 3/29, ventilator was weaned slowly. Patient blood pressure has improved. Patient completed treatment for UTI. Patient developed possible ileus due to poor mobility, which now has resolved. Patient now on trach collar. Nutrition via NGT. SLP working on [employer] and voice. Due to continued fevers, repeat cultures were obtained and with concern for aspiration (patient had removed her NGT numerous times), empiric unasyn was started. on 11/26/17- she did well with modified barium swallow, speech pathologist recommends oral feeding. Continue IV Vanco  started on 11/24/2017 pending final ID and sensitivity on blood cultures from 11/25/2017, will repeat blood cultures on 11/27/2017 if still positive consider TEE    Assessment & Plan:   Active Problems:   Sepsis ([place])   Acute respiratory failure with hypoxemia (H[facility])   Hypertensive emergency   Hypoxia   Acute encephalopathy   1)Acute metabolic encephalopathy felt to be multifactorial from hypertensive encephalopathy (PRES), infectious process and hypercarbia. --Cognitively improved, ,  follow commands.   2)Acute respiratory failure with hypoxia and hypercarbia in setting of acute encephalopathy Status post trach/vent dependent - slow weaned off the vent, trach has ben changed to #6cuffless 11/22/17, tolerated well. Continue supportive O2 supplement. Speech workingon PMSV and swallow eval.patient with very slow recovery, patient's husband agreeable to PEG tube  placement if needed. On 11/26/17 she did well with modified barium swallow, speech pathologist recommends oral feeding   3)Sepsis E. coli UTI - resolved- Completed treatment, initially treated with vancomycin and Zosyn subsequently changed to Rocephin x 5 days last day of treatment was 11/16/17  4)Fever-resolved, unclear source at this point, she was treated for E. coli UTI completed antibiotic treatment as above. Repeat blood culture from 11/23/17 with methicillin-resistant coagulase-negative staph, repeat blood cultures from 11/25/2017 also with gram-positive cocci possibly same organism from 11/23/2017 Repeat CXR without consolidation. Question aspiration due to repeatedly self-removing NGT.  Continue IV Vanco started on 11/24/2017 pending final ID and sensitivity on blood cultures  from 11/25/2017, will repeat blood cultures on 11/27/2017 if still positive consider TEE  Hypertensive urgency/Elevated troponin - stable Troponin elevation due to demand ischemia from sepsis and hypertensive episode Echocardiogram LVEF 55-60% and grade 1  diastolic dysfunction no wall motion abnormalities, No ischemic workup indicated, Continue hydralazine 100 mg every 8 hours, labetalol 200 mg TID,clonidine to 0.2 mg TID and Norvasc to 10 mg daily. Continue hydralazine IV as needed   HLD Continue lipitor   Oral candidiasis and vulvovaginal candidiasis Treat with Diflucan   Nausea and vomiting felt to be secondary to ileus - resolved  Continue MiraLAX, Senokot Mobilize as able, patient at baseline non-mobile  Anemia of chronic disease Hemoglobin stable. No signs of overt bleeding  Diabetes mellitus type II A1c 6.7 Lantus, Novolog adjusted per DM coordinator recommendations   Chronic diastolic CHF  Seems to be compensated at this time Monitor for signs of fluid overload  Severe deconditioning Will need SNF on discharge   FEN- on 11/26/17- patient pulled out the nasal feeding tube, she did well with modified barium swallow, speech pathologist recommends oral feeding,  patient's husband Grace [name] is agreeable to PEG tube placement if needed  DVT prophylaxis: Lovenox Code Status: Full Family Communication:  spoke with husband Fish farm manager by phone Disposition Plan: Will need resolution of fever, reliable oral intake and eventually SNF placement   Consultants:   PCCM   Procedures:   ETT 3/25>> 3/29  Trach 3/29 MBS - 11/26/17  DVT Prophylaxis  :  Lovenox   Lab Results  Component Value Date   PLT 572 (H) 11/25/2017   Inpatient Medications  Scheduled Meds: . amLODipine  10 mg Per Tube Daily  . atorvastatin  30 mg Per Tube QHS  . bisacodyl  10 mg Rectal Daily  . chlorhexidine gluconate (MEDLINE KIT)  15 mL Mouth Rinse BID  . cloNIDine  0.2 mg Per Tube TID  . clopidogrel  75 mg Per Tube Daily  . enoxaparin (LOVENOX) injection  40 mg Subcutaneous Q24H  . free water  100 mL Per Tube Q4H  . hydrALAZINE  100 mg Oral Q8H  . insulin aspart  0-15 Units Subcutaneous Q4H  . insulin aspart  3 Units Subcutaneous Q4H    . insulin glargine  25 Units Subcutaneous Daily  . labetalol  200 mg Per Tube TID  . mouth rinse  15 mL Mouth Rinse QID  . metoCLOPramide (REGLAN) injection  5 mg Intravenous Q8H  . pantoprazole sodium  40 mg Per Tube Q1200  . polyethylene glycol  17 g Oral Daily  . senna-docusate  1 tablet Per Tube BID  . sodium chloride flush  3 mL Intravenous Q12H   Continuous Infusions: . sodium chloride 10 mL/hr at 11/25/17 2000  . feeding supplement (JEVITY 1.2 CAL) Stopped (11/26/17 0000)  . fluconazole (DIFLUCAN) IV Stopped (11/26/17 1355)  . vancomycin Stopped (11/26/17 1235)   PRN Meds:.sodium chloride, acetaminophen (TYLENOL) oral liquid 160 mg/5 mL **OR** acetaminophen, hydrALAZINE, iopamidol, labetalol, LORazepam, nitroGLYCERIN, ondansetron **OR** ondansetron (ZOFRAN) IV, RESOURCE THICKENUP CLEAR    Anti-infectives (From admission, onward)   Start     Dose/Rate Route Frequency Ordered Stop   11/25/17 1000  vancomycin (VANCOCIN) 1,250 mg in sodium chloride 0.9 % 250 mL IVPB     1,250 mg 166.7 mL/hr over 90 Minutes Intravenous Every 24 hours 11/24/17 1449     11/24/17 1600  vancomycin (VANCOCIN) 1,250 mg in sodium chloride 0.9 % 250 mL IVPB     1,250 mg 166.7  mL/hr over 90 Minutes Intravenous  Once 11/24/17 1449 11/24/17 1734   11/23/17 1800  Ampicillin-Sulbactam (UNASYN) 3 g in sodium chloride 0.9 % 100 mL IVPB  Status:  Discontinued     3 g 200 mL/hr over 30 Minutes Intravenous Every 6 hours 11/23/17 1549 11/24/17 1437   11/23/17 1100  fluconazole (DIFLUCAN) IVPB 200 mg     200 mg 100 mL/hr over 60 Minutes Intravenous Every 24 hours 11/23/17 0926 11/30/17 1059   11/15/17 2245  fluconazole (DIFLUCAN) 40 MG/ML suspension 200 mg     200 mg Per Tube Daily 11/15/17 2243 11/17/17 0931   11/10/17 1400  cefTRIAXone (ROCEPHIN) 2 g in sodium chloride 0.9 % 100 mL IVPB  Status:  Discontinued     2 g 200 mL/hr over 30 Minutes Intravenous Every 24 hours 11/10/17 1002 11/15/17 0849   11/08/17  1000  vancomycin (VANCOCIN) IVPB 1000 mg/200 mL premix  Status:  Discontinued     1,000 mg 200 mL/hr over 60 Minutes Intravenous Every 24 hours 11/08/17 0409 11/10/17 0958   11/08/17 0400  piperacillin-tazobactam (ZOSYN) IVPB 3.375 g  Status:  Discontinued     3.375 g 12.5 mL/hr over 240 Minutes Intravenous Every 8 hours 11/07/17 2325 11/10/17 1001   11/07/17 1845  piperacillin-tazobactam (ZOSYN) IVPB 3.375 g     3.375 g 100 mL/hr over 30 Minutes Intravenous STAT 11/07/17 1835 11/07/17 2131   11/07/17 1830  vancomycin (VANCOCIN) IVPB 1000 mg/200 mL premix     1,000 mg 200 mL/hr over 60 Minutes Intravenous  Once 11/07/17 1828 11/07/17 2345   11/07/17 1830  piperacillin-tazobactam (ZOSYN) IVPB 2.25 g  Status:  Discontinued     2.25 g 100 mL/hr over 30 Minutes Intravenous Every 6 hours 11/07/17 1828 11/07/17 1835        Objective:   Vitals:   11/26/17 0826 11/26/17 1105 11/26/17 1356 11/26/17 1700  BP:   (!) 155/73   Pulse:   98   Resp:   (!) 24   Temp:   99.7 F (37.6 C)   TempSrc:   Oral   SpO2: 98% 98% 100% 97%  Weight:      Height:        Wt Readings from Last 3 Encounters:  11/25/17 85.1 kg (187 lb 9.8 oz)     Intake/Output Summary (Last 24 hours) at 11/26/2017 1813 Last data filed at 11/26/2017 1415 Gross per 24 hour  Intake 80 ml  Output 1100 ml  Net -1020 ml    Physical Exam  Gen:- Awake Alert, to follow commands HEENT:- feeding tube in [place]ck- #6 cuffless trach with trach collar  lungs-scattered rhonchi bilaterally and movement is fairly symmetrical  CV- S1, S2 normal Abd-  +ve B.Sounds, Abd Soft, No tenderness,    Extremity/Skin:-Skin is warm and dry  psych-affect is appropriate, able to follow commands  neuro-left hemiplegia   Data Review:   Micro Results Recent Results (from the past 240 hour(s))  Culture, blood (Routine X 2) w Reflex to ID Panel     Status: None (Preliminary result)   Collection Time: 11/23/17 10:33 AM  Result Value Ref Range  Status   Specimen Description   Final    BLOOD RIGHT HAND Performed at [facility] [address] [place], [place] [address]    Special Requests   Final    BOTTLES DRAWN AEROBIC AND ANAEROBIC Blood Culture adequate volume Performed at [facility] [address] [place], [place]  [address]    Culture   Final    NO GROWTH 3 DAYS Performed at [facility], [place] [address] [place], [place] [address]    Report Status PENDING  Incomplete  Urine Culture     Status: None   Collection Time: 11/23/17 10:41 AM  Result Value Ref Range Status   Specimen Description   Final    URINE, CATHETERIZED Performed at [facility] [address] [place], [place] [address]    Special Requests   Final    NONE Performed at [facility], [place] [address], [place], [place] [address]    Culture   Final    NO GROWTH Performed at [facility], [place] [address], [place], [place] [address]    Report Status 11/24/2017 FINAL  Final  Culture, blood (Routine X 2) w Reflex to ID Panel     Status: Abnormal (Preliminary result)   Collection Time: 11/23/17 11:11 AM  Result Value Ref Range Status   Specimen Description   Final    BLOOD RIGHT HAND Performed at [facility] [address], [place], [place] [address]    Special Requests   Final    BOTTLES DRAWN AEROBIC AND ANAEROBIC Blood Culture adequate volume Performed at [facility] [address], [place], [place] [address]    Culture  Setup Time   Final    GRAM POSITIVE COCCI IN BOTH AEROBIC AND ANAEROBIC BOTTLES Organism ID to follow CRITICAL RESULT CALLED TO, READ BACK BY AND VERIFIED WITH: Guadlupe Spanish PharmD 14:25 11/24/17 ([email or web address])    Culture (A)  Final    STAPHYLOCOCCUS SPECIES (COAGULASE NEGATIVE) SUSCEPTIBILITIES TO FOLLOW Performed at [facility], [place] [address], [place], [place] [address]    Report Status  PENDING  Incomplete  Blood Culture ID Panel (Reflexed)     Status: Abnormal   Collection Time: 11/23/17 11:11 AM  Result Value Ref Range Status   Enterococcus species NOT DETECTED NOT DETECTED Final   Listeria monocytogenes NOT DETECTED NOT DETECTED Final   Staphylococcus species DETECTED (A) NOT DETECTED Final    Comment: Methicillin (oxacillin) resistant coagulase negative staphylococcus. Possible blood culture contaminant (unless isolated from more than one blood culture draw or clinical case suggests pathogenicity). No antibiotic treatment is indicated for blood  culture contaminants. CRITICAL RESULT CALLED TO, READ BACK BY AND VERIFIED WITH: Guadlupe Spanish PharmD 14:25 11/24/17 ([email or web address])    Staphylococcus aureus NOT DETECTED NOT DETECTED Final   Methicillin resistance DETECTED (A) NOT DETECTED Final    Comment: CRITICAL RESULT CALLED TO, READ BACK BY AND VERIFIED WITH: Guadlupe Spanish PharmD 14:25 11/24/17 ([email or web address])    Streptococcus species NOT DETECTED NOT DETECTED Final   Streptococcus agalactiae NOT DETECTED NOT DETECTED Final   Streptococcus pneumoniae NOT DETECTED NOT DETECTED Final   Streptococcus pyogenes NOT DETECTED NOT DETECTED Final   Acinetobacter baumannii NOT DETECTED NOT DETECTED Final   Enterobacteriaceae species NOT DETECTED NOT DETECTED Final   Enterobacter cloacae complex NOT DETECTED NOT DETECTED Final   Escherichia coli NOT DETECTED NOT DETECTED Final   Klebsiella oxytoca NOT DETECTED NOT DETECTED Final   Klebsiella pneumoniae NOT DETECTED NOT DETECTED Final   Proteus species NOT DETECTED NOT DETECTED Final   Serratia marcescens NOT DETECTED NOT DETECTED Final   Haemophilus influenzae NOT DETECTED NOT DETECTED Final   Neisseria meningitidis NOT DETECTED NOT DETECTED Final   Pseudomonas aeruginosa NOT DETECTED NOT DETECTED Final   Candida albicans  NOT DETECTED NOT DETECTED Final   Candida glabrata NOT DETECTED NOT DETECTED Final   Candida krusei NOT DETECTED NOT  DETECTED Final   Candida parapsilosis NOT DETECTED NOT DETECTED Final   Candida tropicalis NOT DETECTED NOT DETECTED Final    Comment: Performed at [facility], [place] [address], [place], [place] [address]  Culture, blood (routine x 2)     Status: None (Preliminary result)   Collection Time: 11/25/17  5:25 AM  Result Value Ref Range Status   Specimen Description   Final    BLOOD RIGHT HAND Performed at [facility] [address] [place], [place] [address]    Special Requests   Final    BOTTLES DRAWN AEROBIC ONLY Blood Culture adequate volume Performed at [facility] [address] [place], [place] [address]    Culture   Final    NO GROWTH 1 DAY Performed at [facility], [place] [address] [place], [place] [address]    Report Status PENDING  Incomplete  Culture, blood (routine x 2)     Status: None (Preliminary result)   Collection Time: 11/25/17  5:38 AM  Result Value Ref Range Status   Specimen Description   Final    BLOOD LEFT HAND Performed at [facility] [address] [place], [place] [address]    Special Requests   Final    BOTTLES DRAWN AEROBIC AND ANAEROBIC Blood Culture adequate volume   Culture  Setup Time   Final    GRAM POSITIVE COCCI IN BOTH AEROBIC AND ANAEROBIC BOTTLES CRITICAL RESULT CALLED TO, READ BACK BY AND VERIFIED WITH: Chales Abrahams PHARM D 11/26/17 AT 900 BY CM Performed at [facility], [place] [address] [place], [place] [address]    Culture GRAM POSITIVE COCCI  Final   Report Status PENDING  Incomplete    Radiology Reports Ct Abdomen Pelvis Wo Contrast  Result Date: 11/07/2017 CLINICAL DATA:  Abdominal pain EXAM: CT ABDOMEN AND PELVIS WITHOUT CONTRAST TECHNIQUE: Multidetector CT imaging of the abdomen and pelvis was performed following the standard protocol without IV contrast. The examination was ordered as a CT angiography study, but there was infiltration during the contrast  injection. COMPARISON:  None. FINDINGS: Lower chest: Coronary artery calcifications.  No pleural effusion. Hepatobiliary: Normal hepatic contours and density. No intra- or extrahepatic biliary dilatation. Cholelithiasis without acute inflammation. Pancreas: Normal parenchymal contours without ductal dilatation. No peripancreatic fluid collection. Spleen: Small spleen. Adrenals/Urinary Tract: --Adrenal glands: Normal. --Right kidney/ureter: Suspected excretion of contrast. Otherwise normal kidneys. --Left kidney/ureter: Suspected excretion of contrast, otherwise normal kidneys. --Urinary bladder: Small amount of excreted contrast in the urinary bladder. Stomach/Bowel: --Stomach/Duodenum: No hiatal hernia or other gastric abnormality. Normal duodenal course. --Small bowel: No dilatation or inflammation. --Colon: No focal abnormality. --Appendix: Normal. Vascular/Lymphatic: Atherosclerotic calcification is present within the non-aneurysmal abdominal aorta, without hemodynamically significant stenosis. No abdominal or pelvic lymphadenopathy. Reproductive: Status post hysterectomy. No adnexal mass. Musculoskeletal. No bony spinal canal stenosis or focal osseous abnormality. Other: None. IMPRESSION: 1. No acute abnormality of the abdomen or pelvis. 2. Aortic Atherosclerosis (ICD10-I70.0). Coronary artery atherosclerosis. Electronically Signed   By: Ulyses Jarred M.D.   On: 11/07/2017 22:41   Dg Chest 1 View  Result Date: 11/09/2017 CLINICAL DATA:  Endotracheal tube placement EXAM: CHEST  1 VIEW COMPARISON:  11/09/2017 FINDINGS: Endotracheal tube remains in good position. Gastric tube in the stomach Left lower lobe consolidation unchanged from earlier today. Right  lung clear. Mild hypoventilation with decreased lung volume IMPRESSION: Endotracheal tube in good position. Left lower lobe atelectasis/infiltrate unchanged. Electronically Signed   By: Franchot Gallo M.D.   On: 11/09/2017 19:03   Dg Chest 2 View  Result  Date: 11/07/2017 CLINICAL DATA:  Abdominal pain and shortness of Breath EXAM: CHEST - 2 VIEW COMPARISON:  None. FINDINGS: Cardiac shadow is mildly enlarged. The lungs are well aerated bilaterally. No sizable effusion is seen. Downward displacement of the left humeral head is noted. This is likely related to some chronic subluxation. Clinical correlation is recommended. IMPRESSION: No acute abnormality in the chest. Downward displacement of the left humeral head likely related to subluxation. Correlate with the physical exam. Electronically Signed   By: Inez Catalina M.D.   On: 11/07/2017 19:08   Dg Abd 1 View  Result Date: 11/24/2017 CLINICAL DATA:  NG tube placement EXAM: ABDOMEN - 1 VIEW COMPARISON:  11/19/2017 FINDINGS: Esophageal tube tip overlies the gastric body, side-port overlies the proximal stomach. Slight increased bowel gas within the abdomen without definitive obstructive pattern. IMPRESSION: Esophageal tube tip overlies the gastric body region. Electronically Signed   By: Donavan Foil M.D.   On: 11/24/2017 19:29   Dg Abd 1 View  Result Date: 11/19/2017 CLINICAL DATA:  62 y/o  F; NG tube placement. EXAM: ABDOMEN - 1 VIEW COMPARISON:  11/19/2017 abdomen radiograph. FINDINGS: Normal bowel gas pattern. Enteric tube tip projects over gastric body. Mild levocurvature of the thoracolumbar junction. Bones are otherwise unremarkable. Surgical clips project over left hemiabdomen IMPRESSION: Enteric tube tip projects over gastric body. Electronically Signed   By: Kristine Garbe M.D.   On: 11/19/2017 22:22   Dg Abd 1 View  Result Date: 11/19/2017 CLINICAL DATA:  Patient pulled NG tube, NG tube placement EXAM: ABDOMEN - 1 VIEW COMPARISON:  11/19/2017 at 1312 hours FINDINGS: Nasogastric tube passes below the diaphragm to lie within the mid stomach. Multiple gallstones and right upper quadrant. Bowel gas pattern is unremarkable. IMPRESSION: Well-positioned nasogastric tube. Electronically Signed    By: Lajean Manes M.D.   On: 11/19/2017 16:47   Dg Abd 1 View  Result Date: 11/19/2017 CLINICAL DATA:  Check nasogastric catheter placement EXAM: ABDOMEN - 1 VIEW COMPARISON:  11/19/2017 FINDINGS: Nasogastric catheter is again coiled upon itself but appears to be at least partially across the gastroesophageal junction. This should be advanced several cm. Multiple gallstones are again seen. Scattered nonobstructive bowel gas pattern is noted. IMPRESSION: Nasogastric catheter folded upon itself distally but appears to be at least partially in the stomach. This should be advanced several cm. Electronically Signed   By: Inez Catalina M.D.   On: 11/19/2017 14:05   Dg Abd 1 View  Result Date: 11/19/2017 CLINICAL DATA:  Evaluate nasogastric tube placement. EXAM: ABDOMEN - 1 VIEW COMPARISON:  11/18/2017.  CT 11/07/2017 FINDINGS: The nasogastric tube appears to be coiled up near the GE junction. The tube has been pulled back since the previous examination. Nonobstructive bowel gas pattern with gas in the transverse colon. IMPRESSION: Nasogastric tube has been pulled back compared to the previous examination. The tube appears to be coiled up near the GE junction. Electronically Signed   By: Markus Daft M.D.   On: 11/19/2017 10:22   Dg Abd 1 View  Result Date: 11/18/2017 CLINICAL DATA:  NG tube placement EXAM: ABDOMEN - 1 VIEW COMPARISON:  11/18/2017 FINDINGS: Esophageal tube tip overlies the expected location of gastric body. Right upper quadrant calcifications corresponding to gallstones.  Visible upper gas pattern nonobstructed. IMPRESSION: Esophageal tube tip overlies expected location of gastric body. Electronically Signed   By: Donavan Foil M.D.   On: 11/18/2017 20:56   Dg Abd 1 View  Result Date: 11/18/2017 CLINICAL DATA:  Encounter for nasogastric tube placement. EXAM: ABDOMEN - 1 VIEW COMPARISON:  Radiograph of November 18, 2017. FINDINGS: The bowel gas pattern is normal. Distal tip of nasogastric tube is seen  in expected position of proximal stomach. Surgical clips are noted in the abdomen. Cholelithiasis is noted. IMPRESSION: Distal tip of nasogastric tube seen in expected position of proximal stomach. No evidence of bowel obstruction or ileus. Electronically Signed   By: Marijo Conception, M.D.   On: 11/18/2017 12:19   Dg Abd 1 View  Result Date: 11/18/2017 CLINICAL DATA:  Nasogastric tube placement EXAM: ABDOMEN - 1 VIEW COMPARISON:  Portable exam 1013 hours compared to 11/17/2017 FINDINGS: Tip of nasogastric tube projects over the distal esophagus, recommend advancing tube 12 cm into stomach. Bowel gas pattern normal. Calcified gallstones in  RIGHT upper quadrant. LEFT mid abdomen surgical clips. No acute osseous findings. IMPRESSION: Tip of nasogastric tube is at the distal esophagus; recommend advancing tube 12 cm. Cholelithiasis. Electronically Signed   By: Lavonia Dana M.D.   On: 11/18/2017 10:53   Dg Abd 1 View  Result Date: 11/17/2017 CLINICAL DATA:  Nasogastric tube placement. EXAM: ABDOMEN - 1 VIEW COMPARISON:  Radiograph of November 16, 2017. FINDINGS: The bowel gas pattern is normal. Distal tip of nasogastric tube is seen in expected position of distal stomach. IMPRESSION: No evidence of bowel obstruction or ileus. Distal tip of nasogastric tube seen in expected position of distal stomach. Electronically Signed   By: Marijo Conception, M.D.   On: 11/17/2017 19:01   Dg Abd 1 View  Result Date: 11/14/2017 CLINICAL DATA:  Tracheostomy patient.  Nasogastric tube placement. EXAM: ABDOMEN - 1 VIEW COMPARISON:  Two days ago FINDINGS: Nasogastric tube tip overlaps the mid stomach. Diffuse bowel gas without obstructive pattern. No concerning mass effect or gas collection. Mild left base atelectasis. IMPRESSION: Nasogastric tube tip over the mid stomach. Electronically Signed   By: Monte Fantasia M.D.   On: 11/14/2017 14:39   Dg Abd 1 View  Result Date: 11/12/2017 CLINICAL DATA:  NG tube placement. EXAM:  ABDOMEN - 1 VIEW COMPARISON:  11/09/2017 FINDINGS: An enteric tube terminates over the distal stomach, slightly more distal than on the prior study. Surgical clips are present in the mid abdomen. There is mild gaseous distension of predominantly large bowel loops in the included portion of the abdomen. No dilated loops of bowel suggestive of mechanical obstruction are identified. No intraperitoneal free air is identified on this supine study. No acute osseous abnormality is seen. IMPRESSION: Enteric tube in the distal stomach. Electronically Signed   By: Logan Bores M.D.   On: 11/12/2017 14:14   Dg Abd 1 View  Result Date: 11/09/2017 CLINICAL DATA:  Orogastric tube placement EXAM: ABDOMEN - 1 VIEW COMPARISON:  11/08/2017 FINDINGS: Gastric tube in the stomach with the tip in the body the stomach. Normal bowel gas pattern. Negative for bowel obstruction. IMPRESSION: Gastric tube body of stomach.  Normal bowel gas pattern. Electronically Signed   By: Franchot Gallo M.D.   On: 11/09/2017 19:02   Dg Abd 1 View  Result Date: 11/08/2017 CLINICAL DATA:  Orogastric tube placement. EXAM: ABDOMEN - 1 VIEW COMPARISON:  None. FINDINGS: The orogastric tube extends well below the diaphragm.  Tip projects in the distal stomach. IMPRESSION: Well-positioned orogastric tube, tip projecting in the distal stomach. Electronically Signed   By: Lajean Manes M.D.   On: 11/08/2017 17:07   Ct Head Wo Contrast  Result Date: 11/08/2017 CLINICAL DATA:  62 year old female with altered mental status. EXAM: CT HEAD WITHOUT CONTRAST TECHNIQUE: Contiguous axial images were obtained from the base of the skull through the vertex without intravenous contrast. COMPARISON:  None. FINDINGS: Evaluation of this exam is limited due to motion artifact. Brain: There is mild age-related atrophy and chronic microvascular ischemic changes. There is no acute intracranial hemorrhage. No mass effect or midline shift. No extra-axial fluid collection.  Vascular: Atherosclerotic calcification of the cerebral vasculature. Skull: Normal. Negative for fracture or focal lesion. Sinuses/Orbits: No acute finding. Other: None IMPRESSION: No definite acute intracranial hemorrhage on this severely motion degraded exam. Electronically Signed   By: Anner Crete M.D.   On: 11/08/2017 02:35   Mr Brain Wo Contrast  Result Date: 11/09/2017 CLINICAL DATA:  Initial evaluation for acute encephalopathy. Evaluate for new stroke. Left-sided weakness. EXAM: MRI HEAD WITHOUT CONTRAST TECHNIQUE: Multiplanar, multiecho pulse sequences of the brain and surrounding structures were obtained without intravenous contrast. COMPARISON:  Comparison made with prior CT from 11/08/2017. FINDINGS: Brain: Generalized age-related cerebral atrophy, with more pronounced cerebellar atrophy noted. Patchy T2/FLAIR hyperintensity within the periventricular and deep white matter both cerebral hemispheres, most consistent with chronic small vessel ischemic disease, mild for age. Superimposed remote lacunar infarcts present within the right basal ganglia and left thalamus as well as the pons. No abnormal foci of restricted diffusion to suggest acute or subacute ischemia. Gray-white matter differentiation maintained. No other is of chronic infarction. No acute or chronic intracranial hemorrhage. No mass lesion, midline shift or mass effect. No hydrocephalus. No extra-axial fluid or scar. Major dural sinuses are patent. Pituitary gland suprasellar region normal. Vascular: Major intracranial vascular flow voids are maintained. Skull and upper cervical spine: Craniocervical junction within normal limits. Upper cervical spine demonstrates mild degenerative spondylolysis without significant stenosis. Bone marrow signal intensity within normal limits. Mild dilatation of Foley noted. Scalp soft tissues within normal limits. Sinuses/Orbits: Globes orbital soft tissues within normal limits. Scattered mucosal  thickening throughout the paranasal sinuses. Layering fluid seen within the nasopharynx. Patient is intubated. Small bilateral mastoid effusions noted. Inner ear structures grossly normal. Other: None. IMPRESSION: 1. No acute intracranial infarct or other abnormality identified. 2. Mild chronic small vessel ischemic disease with small remote lacunar infarcts involving the right basal ganglia, left thalamus, and pons. Electronically Signed   By: Jeannine Boga M.D.   On: 11/09/2017 21:43   Dg Chest Port 1 View  Result Date: 11/24/2017 CLINICAL DATA:  Respiratory failure EXAM: PORTABLE CHEST 1 VIEW COMPARISON:  11/23/2017 FINDINGS: Cardiac shadow is mildly enlarged but stable. Tracheostomy tube and nasogastric catheter are again seen and stable. Lungs are well aerated bilaterally without focal infiltrate or sizable effusion. IMPRESSION: No acute abnormality noted. Electronically Signed   By: Inez Catalina M.D.   On: 11/24/2017 07:29   Dg Chest Port 1 View  Result Date: 11/23/2017 CLINICAL DATA:  Acute respiratory failure, tracheostomy patient, sepsis, acute encephalopathy, former smoker. EXAM: PORTABLE CHEST 1 VIEW COMPARISON:  Portable chest x-ray of November 18, 2017 FINDINGS: The lungs are well-expanded. The interstitial markings in the right lung are mildly increased overall as compared to the left. There is no significant pleural effusion and no pneumothorax. The cardiac silhouette remains enlarged. The pulmonary vascularity is mildly  engorged. The tracheostomy tube tip projects between the clavicular heads. The esophagogastric tube tip in proximal port project below the GE junction. IMPRESSION: Mild overall increase in the prominence of the pulmonary interstitium on the right. This may reflect asymmetric pulmonary edema of cardiac or noncardiac cause. A diffuse interstitial pneumonia is felt less likely. There is no alveolar pneumonia on the right or left. Mild cardiomegaly with increased pulmonary  vascular prominence. The support tubes are in reasonable position. Electronically Signed   By: David  Martinique M.D.   On: 11/23/2017 07:16   Dg Chest Port 1 View  Result Date: 11/18/2017 CLINICAL DATA:  Possible UTI. EXAM: PORTABLE CHEST 1 VIEW COMPARISON:  11/15/2017. FINDINGS: Tracheostomy tube noted with tip over the trachea. Interval removal of NG tube. Cardiomegaly with normal pulmonary vascularity. No focal infiltrate. No pleural effusion or pneumothorax. Degenerative change thoracic spine. IMPRESSION: 1. Interval removal of NG tube. Tracheostomy tube noted with tip over the trachea. 2. Cardiomegaly with normal pulmonary vascularity. No acute pulmonary disease noted. Electronically Signed   By: Marcello Moores  Register   On: 11/18/2017 09:41   Dg Chest Port 1 View  Result Date: 11/15/2017 CLINICAL DATA:  62 year old female admitted last week with sepsis. EXAM: PORTABLE CHEST 1 VIEW COMPARISON:  11/13/2017 and earlier. FINDINGS: Portable AP semi upright view at 0857 hours. Stable tracheostomy tube. Enteric tube courses to the left abdomen, tip not included. Mildly larger lung volumes. Decreased streaky opacity at the lung bases, residual greater on the left. No pneumothorax, pulmonary edema, pleural effusion or consolidation. Mediastinal contours are stable and within normal limits. Negative visible bowel gas pattern. No acute osseous abnormality identified. IMPRESSION: 1.  Stable lines and tubes. 2. Mild atelectasis.  No other acute cardiopulmonary abnormality. Electronically Signed   By: Genevie [name] M.D.   On: 11/15/2017 09:16   Dg Chest Port 1 View  Result Date: 11/13/2017 CLINICAL DATA:  Tracheostomy tube EXAM: PORTABLE CHEST 1 VIEW COMPARISON:  Yesterday FINDINGS: Tracheostomy tube in place. New nasogastric tube which reaches the stomach. There is a low volume chest with streaky opacities favoring atelectasis. No Kerley lines, effusion, or pneumothorax. Cardiopericardial enlargement. IMPRESSION: 1. Lower lung  volumes with increased atelectasis. 2. Cardiomegaly. Electronically Signed   By: Monte Fantasia M.D.   On: 11/13/2017 07:48   Dg Chest Port 1 View  Result Date: 11/12/2017 CLINICAL DATA:  Intermittent chest pain, recent leg fracture EXAM: PORTABLE CHEST 1 VIEW COMPARISON:  11/11/2017 FINDINGS: Interval tracheostomy tube placement in satisfactory position. Interval removal of the nasogastric tube. Right lower lobe airspace disease which may reflect atelectasis versus pneumonia. No pleural effusion. No pneumothorax. Stable cardiomegaly. No acute osseous abnormality. Mild osteoarthritis of bilateral glenohumeral joints. IMPRESSION: Interval tracheostomy tube placement in satisfactory position. Interval removal of the nasogastric tube. Right lower lobe airspace disease which may reflect atelectasis versus pneumonia. Electronically Signed   By: Kathreen Devoid   On: 11/12/2017 13:07   Dg Chest Port 1 View  Result Date: 11/11/2017 CLINICAL DATA:  Endotracheal tube position EXAM: PORTABLE CHEST 1 VIEW COMPARISON:  11/10/2017 FINDINGS: Endotracheal tube in good position.  Gastric tube in the stomach Bibasilar airspace disease left greater than right unchanged. No significant effusion. No pneumothorax. IMPRESSION: Bibasilar airspace disease left greater than right unchanged. Endotracheal tube in good position. Electronically Signed   By: Franchot Gallo M.D.   On: 11/11/2017 07:16   Dg Chest Port 1 View  Result Date: 11/10/2017 CLINICAL DATA:  ETT position EXAM: PORTABLE CHEST 1 VIEW  COMPARISON:  Chest x-rays dated 11/09/2017 and 11/08/2017. FINDINGS: Endotracheal tube is well positioned with tip approximately 3 cm above the level of the carina. Enteric tube passes below the diaphragm. Left lower lobe opacities are stable, presumed atelectasis and/or small effusion. No new lung findings. No pneumothorax seen. IMPRESSION: 1. Endotracheal tube well positioned with tip approximately 3 cm above the carina. 2. Persistent  left lower lobe opacities, stable, probable atelectasis and/or small pleural effusion. No new lung findings. Electronically Signed   By: Franki Cabot M.D.   On: 11/10/2017 09:16   Dg Chest Port 1 View  Result Date: 11/09/2017 CLINICAL DATA:  Hypoxia EXAM: PORTABLE CHEST 1 VIEW COMPARISON:  November 08, 2017 FINDINGS: Endotracheal tube tip is 3.6 cm above the carina. Nasogastric tube tip and side port are below the diaphragm. No pneumothorax. There is airspace consolidation in the left base. Lungs elsewhere are clear. There is cardiomegaly with pulmonary vascularity within normal limits. No adenopathy. No evident bone lesions. IMPRESSION: Tube positions as described without pneumothorax. Patchy consolidation left base. Lungs elsewhere clear. Stable cardiomegaly. Electronically Signed   By: Lowella Grip III M.D.   On: 11/09/2017 08:02   Dg Chest Port 1 View  Result Date: 11/08/2017 CLINICAL DATA:  Status post ET tube placement. EXAM: PORTABLE CHEST 1 VIEW COMPARISON:  Portable film earlier in the day. FINDINGS: Cardiac enlargement. ET tube has been inserted, and lies 3.7 cm above carina. Nasogastric tube is coiled in the stomach. Aeration is improved with partial clearing of edema. No consolidation. IMPRESSION: Improved aeration post ET tube placement. Electronically Signed   By: Staci Righter M.D.   On: 11/08/2017 17:05   Dg Chest Port 1 View  Result Date: 11/08/2017 CLINICAL DATA:  62 year old female with hypoxia. EXAM: PORTABLE CHEST 1 VIEW COMPARISON:  Chest radiograph dated 11/07/2017 FINDINGS: Shallow inspiration with minimal bibasilar atelectasis. No focal consolidation, pleural effusion, or pneumothorax. There is borderline cardiomegaly with mild central vascular prominence which may represent mild vascular congestion. Chronic subluxed appearance of the left shoulder. No acute osseous pathology. IMPRESSION: Probable mild vascular congestion.  No focal consolidation. Electronically Signed   By:  Anner Crete M.D.   On: 11/08/2017 05:20   Dg Abd 2 Views  Result Date: 11/07/2017 CLINICAL DATA:  Diffuse abdominal pain EXAM: ABDOMEN - 2 VIEW COMPARISON:  None. FINDINGS: Scattered large and small bowel gas is noted. Mild retained fecal material is seen. No obstructive changes are noted. No free air is seen. No acute bony abnormality is noted. IMPRESSION: No acute abnormality seen. Electronically Signed   By: Inez Catalina M.D.   On: 11/07/2017 19:10   Dg Abd Portable 1v  Result Date: 11/16/2017 CLINICAL DATA:  Tachycardia.  Vomiting. EXAM: PORTABLE ABDOMEN - 1 VIEW COMPARISON:  11/14/2017. FINDINGS: Gastric tube remains within the stomach. There is moderate gaseous distention of small and large bowel. Distal colonic gas appears to be present. Query LEFT lung base consolidation or atelectasis. IMPRESSION: Moderate gaseous distension appears slightly increased from priors. Query ileus. NG tube in stomach. Electronically Signed   By: Staci Righter M.D.   On: 11/16/2017 09:25   Dg Swallowing Func-speech Pathology  Result Date: 11/26/2017 Objective Swallowing Evaluation: Type of Study: MBS-Modified Barium Swallow Study  Patient Details Name: Alesi Zachery MRN: [id number] Date of Birth: [birth year] Today's Date: 11/26/2017 Time: SLP Start Time (ACUTE ONLY): 1435 -SLP Stop Time (ACUTE ONLY): 1505 SLP Time Calculation (min) (ACUTE ONLY): 30 min Past Medical History: Past Medical History: Diagnosis Date .  Hypertension  . Stroke [facility])   Left sided weakness  Past Surgical History: No past surgical history on file. HPI: Meaghen Vecchiarelli 62 year old female with past medical history of CVA with left hemiparesis, hypertension and CAD who presented to the emergency department with fevers and elevated blood pressure.  She was admitted on 3/24 after being found encephalopathic and septic due to UTI.  On 3/25 she had acute changes on mental status and PCCM was consulted. Patient was intubated for airway protection  and worsening HCRF, patient self extubated on 3/26, failed with continued hypoxia and was remains intubated due to ongoing AMS.  Patient underwent trach 3/29.  Patient completed treatment for UTI per MD notes.  Patient developed possible ileus due to poor mobility.  Patient now on trach support with vent at night.  PMSV ordered.   Subjective: pt awake in chair Assessment / Plan / Recommendation CHL IP CLINICAL IMPRESSIONS 11/26/2017 Clinical Impression Patient presents with moderate oropharyngeal dysphagia without aspiration.  She did demonstrate premature spillage of barium into pharynx, piecemealing and decreased timing of laryngeal closure/swallow resulting in laryngeal penetration of thin liquids.   Pharyngeal swallow fortunately is strong without residuals!  Pt did demonstrate reflexive cough with deeper penetration of thin liquids.  Recommend avoid NG tube and allow pt to have dys1/nectar diet with full supervision and pmsv in place.  Hopefully pt may be able to avoid PEG.  Using live video, educated pt to findings/recommendations.   SLP Visit Diagnosis Dysphagia, oropharyngeal phase (R13.12) Attention and concentration deficit following -- Frontal lobe and executive function deficit following -- Impact on safety and function Moderate aspiration risk   CHL IP TREATMENT RECOMMENDATION 11/26/2017 Treatment Recommendations Therapy as outlined in treatment plan below   Prognosis 11/26/2017 Prognosis for Safe Diet Advancement Fair Barriers to Reach Goals -- Barriers/Prognosis Comment -- CHL IP DIET RECOMMENDATION 11/26/2017 SLP Diet Recommendations Dysphagia 1 (Puree) solids;Nectar thick liquid Liquid Administration via Cup;Straw Medication Administration Whole meds with puree Compensations Slow rate;Small sips/bites;Other (Comment) Postural Changes Seated upright at 90 degrees;Remain semi-upright after after feeds/meals (Comment)   CHL IP OTHER RECOMMENDATIONS 11/26/2017 Recommended Consults -- Oral Care  Recommendations Oral care QID;Oral care before and after PO Other Recommendations Have oral suction available   CHL IP FOLLOW UP RECOMMENDATIONS 11/26/2017 Follow up Recommendations Skilled Nursing facility   [facility] IP FREQUENCY AND DURATION 11/26/2017 Speech Therapy Frequency (ACUTE ONLY) min 2x/week Treatment Duration 2 weeks      CHL IP ORAL PHASE 11/26/2017 Oral Phase Impaired Oral - Pudding Teaspoon -- Oral - Pudding Cup -- Oral - Honey Teaspoon -- Oral - Honey Cup -- Oral - Nectar Teaspoon Premature spillage;Lingual/palatal residue;Piecemeal swallowing Oral - Nectar Cup Decreased bolus cohesion;Premature spillage;Delayed oral transit;Lingual/palatal residue;Piecemeal swallowing Oral - Nectar Straw Decreased bolus cohesion;Premature spillage;Delayed oral transit;Lingual/palatal residue;Piecemeal swallowing Oral - Thin Teaspoon Premature spillage;Decreased bolus cohesion;Delayed oral transit;Lingual/palatal residue;Piecemeal swallowing Oral - Thin Cup Premature spillage;Decreased bolus cohesion;Delayed oral transit;Lingual/palatal residue;Piecemeal swallowing Oral - Thin Straw Decreased bolus cohesion;Premature spillage;Lingual/palatal residue;Piecemeal swallowing Oral - Puree Premature spillage;Decreased bolus cohesion;Delayed oral transit;Lingual/palatal residue;Piecemeal swallowing Oral - Mech Soft Decreased bolus cohesion;Premature spillage;Weak lingual manipulation;Delayed oral transit;Lingual/palatal residue Oral - Regular -- Oral - Multi-Consistency -- Oral - Pill Weak lingual manipulation;Delayed oral transit;Lingual/palatal residue Oral Phase - Comment --  CHL IP PHARYNGEAL PHASE 11/26/2017 Pharyngeal Phase Impaired Pharyngeal- Pudding Teaspoon -- Pharyngeal -- Pharyngeal- Pudding Cup -- Pharyngeal -- Pharyngeal- Honey Teaspoon -- Pharyngeal -- Pharyngeal- Honey Cup -- Pharyngeal -- Pharyngeal- Nectar Teaspoon Delayed swallow initiation-vallecula  Pharyngeal -- Pharyngeal- Nectar Cup Delayed swallow  initiation-vallecula Pharyngeal -- Pharyngeal- Nectar Straw Delayed swallow initiation-vallecula Pharyngeal -- Pharyngeal- Thin Teaspoon Delayed swallow initiation-vallecula Pharyngeal -- Pharyngeal- Thin Cup Penetration/Aspiration during swallow;Penetration/Apiration after swallow;Delayed swallow initiation-vallecula Pharyngeal Material enters airway, remains ABOVE vocal cords and not ejected out Pharyngeal- Thin Straw Delayed swallow initiation-vallecula;Penetration/Aspiration during swallow;Penetration/Apiration after swallow Pharyngeal Material enters airway, remains ABOVE vocal cords and not ejected out;Material enters airway, CONTACTS cords and then ejected out Pharyngeal- Puree Delayed swallow initiation-vallecula Pharyngeal -- Pharyngeal- Mechanical Soft Delayed swallow initiation-vallecula Pharyngeal -- Pharyngeal- Regular -- Pharyngeal -- Pharyngeal- Multi-consistency -- Pharyngeal -- Pharyngeal- Pill Delayed swallow initiation-vallecula Pharyngeal -- Pharyngeal Comment --  CHL IP CERVICAL ESOPHAGEAL PHASE 11/26/2017 Cervical Esophageal Phase WFL Pudding Teaspoon -- Pudding Cup -- Honey Teaspoon -- Honey Cup -- Nectar Teaspoon -- Nectar Cup -- Nectar Straw -- Thin Teaspoon -- Thin Cup -- Thin Straw -- Puree -- Mechanical Soft -- Regular -- Multi-consistency -- Pill -- Cervical Esophageal Comment -- Luanna Salk, MS [facility] SLP [phone] 11/26/2017, 4:35 PM                CBC Recent Labs  Lab 11/20/17 0324 11/23/17 0309 11/24/17 0814 11/25/17 0526  WBC 9.6 10.9* 9.7 7.3  HGB 8.9* 9.2* 9.1* 8.8*  HCT 30.0* 30.1* 29.4* 28.7*  PLT 480* 563* 570* 572*  MCV 92.0 89.6 88.6 89.7  MCH 27.3 27.4 27.4 27.5  MCHC 29.7* 30.6 31.0 30.7  RDW 15.5 14.9 14.8 14.9    Chemistries  Recent Labs  Lab 11/23/17 0309 11/24/17 0814 11/25/17 0526  NA 139 135 137  K 4.3 4.1 4.3  CL 102 97* 98*  CO2 27 26 30   GLUCOSE 217* 214* 139*  BUN 12 14 11   CREATININE 0.74 0.81 0.74  CALCIUM 9.3 9.1 9.3  MG 2.0  --    --    ------------------------------------------------------------------------------------------------------------------ No results for input(s): CHOL, HDL, LDLCALC, TRIG, CHOLHDL, LDLDIRECT in the last 72 hours.  Lab Results  Component Value Date   HGBA1C 6.7 (H) 11/08/2017   ------------------------------------------------------------------------------------------------------------------ No results for input(s): TSH, T4TOTAL, T3FREE, THYROIDAB in the last 72 hours.  Invalid input(s): FREET3 ------------------------------------------------------------------------------------------------------------------ No results for input(s): VITAMINB12, FOLATE, FERRITIN, TIBC, IRON, RETICCTPCT in the last 72 hours.  Coagulation profile No results for input(s): INR, PROTIME in the last 168 hours.  No results for input(s): DDIMER in the last 72 hours.  Cardiac Enzymes No results for input(s): CKMB, TROPONINI, MYOGLOBIN in the last 168 hours.  Invalid input(s): CK ------------------------------------------------------------------------------------------------------------------ No results found for: BNP   Roxan Hockey M.D on 11/26/2017 at 6:13 PM  Between 7am to 7pm - Pager - [phone]  After 7pm go to [email or web address] - password [email or web address]  [facility] -  Office  [phone]   Voice Recognition Viviann Spare dictation system was used to create this note, attempts have been made to correct errors. Please contact the author with questions and/or clarifications.
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
## 2017-11-26 NOTE — Progress Notes (Signed)
Patient ID: Grace LefevreGwendolyn Weidinger, female   DOB: August 17, 1956, 62 y.o.   MRN: [id number] Aware of request for gastrostomy tube placement in patient.  Imaging studies have been reviewed by Dr. Fredia SorrowYamagata and anatomically patient is a candidate for tube placement, however she has recently been on Plavix with last dose on 4/11.  She will need to remain off Plavix for 5 days before tube can be placed.  Lovenox can be used up until 24 hours before tube placement.  She also has recently noted positive blood cultures with gram-positive cocci identified-final results pending.  Above discussed with Dr.Emokpae.  We will continue to monitor status/MBS in the interim.

## 2017-11-27 LAB — GLUCOSE, CAPILLARY
GLUCOSE-CAPILLARY: 142 mg/dL — AB (ref 65–99)
Glucose-Capillary: 107 mg/dL — ABNORMAL HIGH (ref 65–99)
Glucose-Capillary: 117 mg/dL — ABNORMAL HIGH (ref 65–99)
Glucose-Capillary: 121 mg/dL — ABNORMAL HIGH (ref 65–99)
Glucose-Capillary: 188 mg/dL — ABNORMAL HIGH (ref 65–99)
Glucose-Capillary: 67 mg/dL (ref 65–99)
Glucose-Capillary: 78 mg/dL (ref 65–99)
Glucose-Capillary: 92 mg/dL (ref 65–99)

## 2017-11-27 LAB — CULTURE, BLOOD (ROUTINE X 2): Special Requests: ADEQUATE

## 2017-11-27 LAB — CREATININE, SERUM
CREATININE: 0.94 mg/dL (ref 0.44–1.00)
GFR calc Af Amer: >60 mL/min (ref >60)

## 2017-11-27 LAB — PROCALCITONIN: Procalcitonin: <0.1 ng/mL

## 2017-11-27 MED ORDER — CLOPIDOGREL BISULFATE 75 MG PO TABS
75.0000 mg | ORAL_TABLET | Freq: Every day | ORAL | Status: DC
  Administered 2017-11-28 – 2017-11-30 (×3): 75 mg via ORAL
  Filled 2017-11-27 (×3): qty 1

## 2017-11-27 MED ORDER — AMLODIPINE BESYLATE 10 MG PO TABS
10.0000 mg | ORAL_TABLET | Freq: Every day | ORAL | Status: DC
  Administered 2017-11-27 – 2017-11-30 (×4): 10 mg via ORAL
  Filled 2017-11-27 (×4): qty 1

## 2017-11-27 MED ORDER — ONDANSETRON HCL 4 MG PO TABS: 4.0000 mg | ORAL_TABLET | Freq: Four times a day (QID) | ORAL | Status: DC | PRN

## 2017-11-27 MED ORDER — LABETALOL HCL 200 MG PO TABS
200.0000 mg | ORAL_TABLET | Freq: Three times a day (TID) | ORAL | Status: DC
  Administered 2017-11-27 – 2017-11-30 (×10): 200 mg via ORAL
  Filled 2017-11-27 (×12): qty 1

## 2017-11-27 MED ORDER — ACETAMINOPHEN 160 MG/5ML PO SOLN
650.0000 mg | Freq: Four times a day (QID) | ORAL | Status: DC | PRN
  Administered 2017-11-28 – 2017-11-30 (×4): 650 mg via ORAL
  Filled 2017-11-27 (×4): qty 20.3

## 2017-11-27 MED ORDER — CLONIDINE HCL 0.1 MG PO TABS
0.2000 mg | ORAL_TABLET | Freq: Three times a day (TID) | ORAL | Status: DC
  Administered 2017-11-27 – 2017-11-30 (×10): 0.2 mg via ORAL
  Filled 2017-11-27 (×10): qty 2

## 2017-11-27 MED ORDER — PANTOPRAZOLE SODIUM 40 MG PO PACK
40.0000 mg | PACK | Freq: Every day | ORAL | Status: DC
  Administered 2017-11-27 – 2017-11-30 (×4): 40 mg via ORAL
  Filled 2017-11-27 (×4): qty 20

## 2017-11-27 MED ORDER — ONDANSETRON HCL 4 MG/2ML IJ SOLN
4.0000 mg | Freq: Four times a day (QID) | INTRAMUSCULAR | Status: DC | PRN
  Administered 2017-11-28: 4 mg via INTRAVENOUS
  Filled 2017-11-27: qty 2

## 2017-11-27 MED ORDER — ATORVASTATIN CALCIUM 10 MG PO TABS
30.0000 mg | ORAL_TABLET | Freq: Every day | ORAL | Status: DC
  Administered 2017-11-27 – 2017-11-29 (×3): 30 mg via ORAL
  Filled 2017-11-27 (×3): qty 3

## 2017-11-27 MED ORDER — ACETAMINOPHEN 650 MG RE SUPP: 650.0000 mg | Freq: Four times a day (QID) | RECTAL | Status: DC | PRN

## 2017-11-27 MED ORDER — SENNOSIDES-DOCUSATE SODIUM 8.6-50 MG PO TABS
1.0000 | ORAL_TABLET | Freq: Two times a day (BID) | ORAL | Status: DC
  Administered 2017-11-27 – 2017-11-29 (×4): 1 via ORAL
  Filled 2017-11-27 (×5): qty 1

## 2017-11-27 MED ORDER — FREE WATER: 100.0000 mL | Status: DC

## 2017-11-27 NOTE — Progress Notes (Signed)
Patient Demographics:    Grace [name], is a 62 y.o. [name], DOB - 1955-09-09, TCY:[id number]  Admit date - 11/07/2017   Admitting Physician Grace Mocha, MD  Outpatient Primary MD for the patient is Grace [name], Grace Silvas, MD  LOS - 20   Chief Complaint  Patient presents with  . Abdominal Pain  . Fever        Subjective:    Grace [name] today has no fevers, no emesis,  No chest pain,  tolerating oral intake well  Assessment  & Plan :    Principal Problem:   Acute encephalopathy Active Problems:   Sepsis ([place])   Acute respiratory failure with hypoxemia [facility])   Hypertensive emergency   Hypoxia   Brief Narrative:  Grace [name] with past medical history of CVA with left hemiparesis, hypertension and CAD who presented to the emergency department with fevers and elevated blood pressure. She was admitted on 3/24 after being found encephalopathic and septic due to UTI. On 3/25 she had acute changes on mental status and PCCM was consulted. Patient was intubated for airway protection and worsening HCRF, patient self extubated on 3/26, failed with continued hypoxia and was re-intubated due to ongoing AMS. Patient underwent trach 3/29, ventilator was weaned slowly. Patient blood pressure has improved. Patient completed treatment for UTI. Patient developed possible ileus due to poor mobility, which now has resolved. Patient now on trach collar. Nutrition via NGT. SLP working on [employer] and voice. Due to continued fevers, repeat cultures were obtained and with concern for aspiration (patient had removed her NGT numerous times), empiric unasyn was started. on 11/26/17- she did well with modified barium swallow, speech pathologist recommends oral feeding. Continue IV Vanco started on 11/24/2017 pending final ID and sensitivity on blood cultures from 11/25/2017, will repeat  blood cultures on 11/27/2017 if still positive consider TEE    Plan:- 1)Acute metabolic encephalopathy-  felt to be multifactorial from hypertensive encephalopathy (PRES), infectious process and hypercarbia. --Cognitively improved,  follow commands.   2)Acute respiratory failure with hypoxia and hypercarbia in setting of acute encephalopathy Status post trach/vent dependent - slow weaned off the vent, trach has ben changed to #6cuffless 11/22/17, tolerated well. Continue supportive O2 supplement. Speech workingon PMSV and swallow eval.patient with very slow recovery, patient's husband agreeable to PEG tube  placement if needed. On 11/26/17 she did well with modified barium swallow, speech pathologist recommends oral feeding.,  Appears to be tolerating oral feeding well at this time   3)Sepsis E. coli UTI - resolved- Completed treatment, initially treated with vancomycin and Zosyn subsequently changed to Rocephin x 5 days last day of treatment was 11/16/17  4)Fever-resolved, unclear source at this point, she was treated for E. coli UTI completed antibiotic treatment as above. Repeat blood culture from 11/23/17 with methicillin-resistant coagulase-negative staph, repeat blood cultures from 11/25/2017 also with gram-positive cocci possibly same organism from 11/23/2017 Repeat CXR without consolidation.   Continue IV Vanco started on 11/24/2017 pending final ID and sensitivity on blood cultures from 11/25/2017, repeat blood cultures on 11/27/2017 ordered , if blood cx is  still positive consider TEE  5)Hypertensive Urgency/Elevated troponin - stable, continue amlodipine 10 mg daily, hydralazine 100 mg every 8 hours, clonidine 0.2 mg 3 times  daily, labetalol 200 mg 3 times daily, okay to use additional IV hydralazine or IV labetalol for elevated BP  6)HLD- Continue lipitor   7)Oral candidiasis and vulvovaginal candidiasis- c/n  Diflucan for a total of 7 doses  8)Anemia of chronic disease Hemoglobin  stable. No signs of overt bleeding  9)Diabetes mellitus type II A1c 6.7 Lantus 25 units subcu daily,, Novolog adjusted per DM coordinator recommendations   10)HFpEF-with history of chronic diastolic CHF , last known EF 55-60%, Seems to be compensated at this time Monitor for signs of fluid overload  11)Severe deconditioning Will need SNF on discharge   12)FEN- on 11/26/17- patient pulled out the nasal feeding tube, she did well with modified barium swallow, speech pathologist recommends oral feeding,  patient's husband Grace [name] is agreeable to PEG tube placement if needed.  For now patient appears to be tolerating oral intake well  DVT prophylaxis: Lovenox Code Status: Full Family Communication:  spoke with husband Grace [name] by phone Disposition Plan: Will need resolution of fever, reliable oral intake and eventually SNF placement   Consultants:   PCCM   Procedures:   ETT 3/25>> 3/29  Trach 3/29 MBS - 11/26/17  DVT Prophylaxis  :  Lovenox   Lab Results  Component Value Date   PLT 572 (H) 11/25/2017   Inpatient Medications  Scheduled Meds: . amLODipine  10 mg Oral Daily  . atorvastatin  30 mg Oral QHS  . bisacodyl  10 mg Rectal Daily  . chlorhexidine gluconate (MEDLINE KIT)  15 mL Mouth Rinse BID  . cloNIDine  0.2 mg Oral TID  . [START ON 11/28/2017] clopidogrel  75 mg Oral Daily  . enoxaparin (LOVENOX) injection  40 mg Subcutaneous Q24H  . hydrALAZINE  100 mg Oral Q8H  . insulin aspart  0-15 Units Subcutaneous Q4H  . insulin glargine  25 Units Subcutaneous Daily  . labetalol  200 mg Oral TID  . mouth rinse  15 mL Mouth Rinse QID  . metoCLOPramide (REGLAN) injection  5 mg Intravenous Q8H  . pantoprazole sodium  40 mg Oral Q1200  . polyethylene glycol  17 g Oral Daily  . senna-docusate  1 tablet Oral BID  . sodium chloride flush  3 mL Intravenous Q12H   Continuous Infusions: . sodium chloride 10 mL/hr at 11/25/17 2000  . fluconazole (DIFLUCAN) IV Stopped  (11/27/17 1235)  . vancomycin Stopped (11/27/17 1724)   PRN Meds:.sodium chloride, acetaminophen (TYLENOL) oral liquid 160 mg/5 mL **OR** acetaminophen, hydrALAZINE, iopamidol, labetalol, LORazepam, nitroGLYCERIN, ondansetron **OR** ondansetron (ZOFRAN) IV, RESOURCE THICKENUP CLEAR    Anti-infectives (From admission, onward)   Start     Dose/Rate Route Frequency Ordered Stop   11/25/17 1000  vancomycin (VANCOCIN) 1,250 mg in sodium chloride 0.9 % 250 mL IVPB     1,250 mg 166.7 mL/hr over 90 Minutes Intravenous Every 24 hours 11/24/17 1449     11/24/17 1600  vancomycin (VANCOCIN) 1,250 mg in sodium chloride 0.9 % 250 mL IVPB     1,250 mg 166.7 mL/hr over 90 Minutes Intravenous  Once 11/24/17 1449 11/24/17 1734   11/23/17 1800  Ampicillin-Sulbactam (UNASYN) 3 g in sodium chloride 0.9 % 100 mL IVPB  Status:  Discontinued     3 g 200 mL/hr over 30 Minutes Intravenous Every 6 hours 11/23/17 1549 11/24/17 1437   11/23/17 1100  fluconazole (DIFLUCAN) IVPB 200 mg     200 mg 100 mL/hr over 60 Minutes Intravenous Every 24 hours 11/23/17 0926 11/30/17 1059   11/15/17 2245  fluconazole (DIFLUCAN) 40 MG/ML suspension 200 mg     200 mg Per Tube Daily 11/15/17 2243 11/17/17 0931   11/10/17 1400  cefTRIAXone (ROCEPHIN) 2 g in sodium chloride 0.9 % 100 mL IVPB  Status:  Discontinued     2 g 200 mL/hr over 30 Minutes Intravenous Every 24 hours 11/10/17 1002 11/15/17 0849   11/08/17 1000  vancomycin (VANCOCIN) IVPB 1000 mg/200 mL premix  Status:  Discontinued     1,000 mg 200 mL/hr over 60 Minutes Intravenous Every 24 hours 11/08/17 0409 11/10/17 0958   11/08/17 0400  piperacillin-tazobactam (ZOSYN) IVPB 3.375 g  Status:  Discontinued     3.375 g 12.5 mL/hr over 240 Minutes Intravenous Every 8 hours 11/07/17 2325 11/10/17 1001   11/07/17 1845  piperacillin-tazobactam (ZOSYN) IVPB 3.375 g     3.375 g 100 mL/hr over 30 Minutes Intravenous STAT 11/07/17 1835 11/07/17 2131   11/07/17 1830  vancomycin  (VANCOCIN) IVPB 1000 mg/200 mL premix     1,000 mg 200 mL/hr over 60 Minutes Intravenous  Once 11/07/17 1828 11/07/17 2345   11/07/17 1830  piperacillin-tazobactam (ZOSYN) IVPB 2.25 g  Status:  Discontinued     2.25 g 100 mL/hr over 30 Minutes Intravenous Every 6 hours 11/07/17 1828 11/07/17 1835        Objective:   Vitals:   11/27/17 1129 11/27/17 1414 11/27/17 1526 11/27/17 1707  BP:  138/71 139/73 (!) 141/73  Pulse:  86 90 91  Resp:  20 18   Temp:  98 F (36.7 C) 98 F (36.7 C)   TempSrc:  Oral    SpO2: 98% 100% 100% 100%  Weight:      Height:        Wt Readings from Last 3 Encounters:  11/25/17 85.1 kg (187 lb 9.8 oz)     Intake/Output Summary (Last 24 hours) at 11/27/2017 1810 Last data filed at 11/27/2017 0432 Gross per 24 hour  Intake 0 ml  Output 400 ml  Net -400 ml    Physical Exam  Gen:- Awake Alert, to follow commands HEENT:- NCAT, pupils are reactive Neck- #6 cuffless trach with trach collar  lungs-scattered rhonchi bilaterally and movement is fairly symmetrical  CV- S1, S2 normal Abd-  +ve B.Sounds, Abd Soft, No tenderness,    Extremity/Skin:-Skin is warm and dry  psych-affect is appropriate, able to follow commands  neuro-left hemiplegia   Data Review:   Micro Results Recent Results (from the past 240 hour(s))  Culture, blood (Routine X 2) w Reflex to ID Panel     Status: None (Preliminary result)   Collection Time: 11/23/17 10:33 AM  Result Value Ref Range Status   Specimen Description   Final    BLOOD RIGHT HAND Performed at [facility] [address], [place], [place] [address]    Special Requests   Final    BOTTLES DRAWN AEROBIC AND ANAEROBIC Blood Culture adequate volume Performed at [facility] [address], [place], [place] [address]    Culture   Final    NO GROWTH 4 DAYS Performed at [facility], [place] [address] [place], [place] [address]    Report Status PENDING  Incomplete    Urine Culture     Status: None   Collection Time: 11/23/17 10:41 AM  Result Value Ref Range Status   Specimen Description   Final    URINE, CATHETERIZED Performed at [facility] [address] [place], [place]  [address]    Special Requests   Final    NONE Performed at [facility], [place] [address], [place], [place] [address]    Culture   Final    NO GROWTH Performed at [facility], [place] [address] [place], [place] [address]    Report Status 11/24/2017 FINAL  Final  Culture, blood (Routine X 2) w Reflex to ID Panel     Status: Abnormal   Collection Time: 11/23/17 11:11 AM  Result Value Ref Range Status   Specimen Description   Final    BLOOD RIGHT HAND Performed at [facility] [address], [place], [place] [address]    Special Requests   Final    BOTTLES DRAWN AEROBIC AND ANAEROBIC Blood Culture adequate volume Performed at [facility] [address], [place], [place] [address]    Culture  Setup Time   Final    GRAM POSITIVE COCCI IN BOTH AEROBIC AND ANAEROBIC BOTTLES CRITICAL RESULT CALLED TO, READ BACK BY AND VERIFIED WITH: Guadlupe Spanish PharmD 14:25 11/24/17 ([email or web address]) Performed at [facility], [place] [address], [place], [place] [address]    Culture STAPHYLOCOCCUS SPECIES (COAGULASE NEGATIVE) (A)  Final   Report Status 11/27/2017 FINAL  Final   Organism ID, Bacteria STAPHYLOCOCCUS SPECIES (COAGULASE NEGATIVE)  Final      Susceptibility   Staphylococcus species (coagulase negative) - MIC*    CIPROFLOXACIN 1 SENSITIVE Sensitive     ERYTHROMYCIN >=8 RESISTANT Resistant     GENTAMICIN <=0.5 SENSITIVE Sensitive     OXACILLIN SENSITIVE Sensitive     TETRACYCLINE <=1 SENSITIVE Sensitive     VANCOMYCIN <=0.5 SENSITIVE Sensitive     TRIMETH/SULFA <=10 SENSITIVE Sensitive     CLINDAMYCIN INTERMEDIATE Intermediate     RIFAMPIN <=0.5 SENSITIVE Sensitive     Inducible Clindamycin  NEGATIVE Sensitive     * STAPHYLOCOCCUS SPECIES (COAGULASE NEGATIVE)  Blood Culture ID Panel (Reflexed)     Status: Abnormal   Collection Time: 11/23/17 11:11 AM  Result Value Ref Range Status   Enterococcus species NOT DETECTED NOT DETECTED Final   Listeria monocytogenes NOT DETECTED NOT DETECTED Final   Staphylococcus species DETECTED (A) NOT DETECTED Final    Comment: Methicillin (oxacillin) resistant coagulase negative staphylococcus. Possible blood culture contaminant (unless isolated from more than one blood culture draw or clinical case suggests pathogenicity). No antibiotic treatment is indicated for blood  culture contaminants. CRITICAL RESULT CALLED TO, READ BACK BY AND VERIFIED WITH: Guadlupe Spanish PharmD 14:25 11/24/17 ([email or web address])    Staphylococcus aureus NOT DETECTED NOT DETECTED Final   Methicillin resistance DETECTED (A) NOT DETECTED Final    Comment: CRITICAL RESULT CALLED TO, READ BACK BY AND VERIFIED WITH: Guadlupe Spanish PharmD 14:25 11/24/17 ([email or web address])    Streptococcus species NOT DETECTED NOT DETECTED Final   Streptococcus agalactiae NOT DETECTED NOT DETECTED Final   Streptococcus pneumoniae NOT DETECTED NOT DETECTED Final   Streptococcus pyogenes NOT DETECTED NOT DETECTED Final   Acinetobacter baumannii NOT DETECTED NOT DETECTED Final   Enterobacteriaceae species NOT DETECTED NOT DETECTED Final   Enterobacter cloacae complex NOT DETECTED NOT DETECTED Final   Escherichia coli NOT DETECTED NOT DETECTED Final   Klebsiella oxytoca NOT DETECTED NOT DETECTED Final   Klebsiella pneumoniae NOT DETECTED NOT DETECTED Final   Proteus species NOT DETECTED NOT DETECTED Final   Serratia marcescens NOT DETECTED NOT DETECTED Final   Haemophilus influenzae NOT DETECTED NOT DETECTED Final   Neisseria meningitidis NOT  DETECTED NOT DETECTED Final   Pseudomonas aeruginosa NOT DETECTED NOT DETECTED Final   Candida albicans NOT DETECTED NOT DETECTED Final   Candida glabrata NOT DETECTED NOT  DETECTED Final   Candida krusei NOT DETECTED NOT DETECTED Final   Candida parapsilosis NOT DETECTED NOT DETECTED Final   Candida tropicalis NOT DETECTED NOT DETECTED Final    Comment: Performed at [facility], [place] [address] [place], [place] [address]  Culture, blood (routine x 2)     Status: None (Preliminary result)   Collection Time: 11/25/17  5:25 AM  Result Value Ref Range Status   Specimen Description   Final    BLOOD RIGHT HAND Performed at [facility] [address], [place], [place] [address]    Special Requests   Final    BOTTLES DRAWN AEROBIC ONLY Blood Culture adequate volume Performed at [facility] [address], [place], [place] [address]    Culture   Final    NO GROWTH 2 DAYS Performed at [facility] [address], [place], [place] [address]    Report Status PENDING  Incomplete  Culture, blood (routine x 2)     Status: Abnormal (Preliminary result)   Collection Time: 11/25/17  5:38 AM  Result Value Ref Range Status   Specimen Description   Final    BLOOD LEFT HAND Performed at [facility] [address], [place], [place] [address]    Special Requests   Final    BOTTLES DRAWN AEROBIC AND ANAEROBIC Blood Culture adequate volume   Culture  Setup Time   Final    GRAM POSITIVE COCCI IN BOTH AEROBIC AND ANAEROBIC BOTTLES CRITICAL RESULT CALLED TO, READ BACK BY AND VERIFIED WITH: Chales Abrahams PHARM D 11/26/17 AT 900 BY CM Performed at [facility], [place] [address], [place], [place] [address]    Culture STAPHYLOCOCCUS SPECIES (COAGULASE NEGATIVE) (A)  Final   Report Status PENDING  Incomplete    Radiology Reports Ct Abdomen Pelvis Wo Contrast  Result Date: 11/07/2017 CLINICAL DATA:  Abdominal pain EXAM: CT ABDOMEN AND PELVIS WITHOUT CONTRAST TECHNIQUE: Multidetector CT imaging of the abdomen and pelvis was performed following the standard protocol without IV contrast. The examination was  ordered as a CT angiography study, but there was infiltration during the contrast injection. COMPARISON:  None. FINDINGS: Lower chest: Coronary artery calcifications.  No pleural effusion. Hepatobiliary: Normal hepatic contours and density. No intra- or extrahepatic biliary dilatation. Cholelithiasis without acute inflammation. Pancreas: Normal parenchymal contours without ductal dilatation. No peripancreatic fluid collection. Spleen: Small spleen. Adrenals/Urinary Tract: --Adrenal glands: Normal. --Right kidney/ureter: Suspected excretion of contrast. Otherwise normal kidneys. --Left kidney/ureter: Suspected excretion of contrast, otherwise normal kidneys. --Urinary bladder: Small amount of excreted contrast in the urinary bladder. Stomach/Bowel: --Stomach/Duodenum: No hiatal hernia or other gastric abnormality. Normal duodenal course. --Small bowel: No dilatation or inflammation. --Colon: No focal abnormality. --Appendix: Normal. Vascular/Lymphatic: Atherosclerotic calcification is present within the non-aneurysmal abdominal aorta, without hemodynamically significant stenosis. No abdominal or pelvic lymphadenopathy. Reproductive: Status post hysterectomy. No adnexal mass. Musculoskeletal. No bony spinal canal stenosis or focal osseous abnormality. Other: None. IMPRESSION: 1. No acute abnormality of the abdomen or pelvis. 2. Aortic Atherosclerosis (ICD10-I70.0). Coronary artery atherosclerosis. Electronically Signed   By: Ulyses Jarred M.D.   On: 11/07/2017 22:41   Dg Chest 1 View  Result Date: 11/09/2017 CLINICAL DATA:  Endotracheal tube placement EXAM: CHEST  1 VIEW COMPARISON:  11/09/2017 FINDINGS: Endotracheal  tube remains in good position. Gastric tube in the stomach Left lower lobe consolidation unchanged from earlier today. Right lung clear. Mild hypoventilation with decreased lung volume IMPRESSION: Endotracheal tube in good position. Left lower lobe atelectasis/infiltrate unchanged. Electronically  Signed   By: Franchot Gallo M.D.   On: 11/09/2017 19:03   Dg Chest 2 View  Result Date: 11/07/2017 CLINICAL DATA:  Abdominal pain and shortness of Breath EXAM: CHEST - 2 VIEW COMPARISON:  None. FINDINGS: Cardiac shadow is mildly enlarged. The lungs are well aerated bilaterally. No sizable effusion is seen. Downward displacement of the left humeral head is noted. This is likely related to some chronic subluxation. Clinical correlation is recommended. IMPRESSION: No acute abnormality in the chest. Downward displacement of the left humeral head likely related to subluxation. Correlate with the physical exam. Electronically Signed   By: Inez Catalina M.D.   On: 11/07/2017 19:08   Dg Abd 1 View  Result Date: 11/24/2017 CLINICAL DATA:  NG tube placement EXAM: ABDOMEN - 1 VIEW COMPARISON:  11/19/2017 FINDINGS: Esophageal tube tip overlies the gastric body, side-port overlies the proximal stomach. Slight increased bowel gas within the abdomen without definitive obstructive pattern. IMPRESSION: Esophageal tube tip overlies the gastric body region. Electronically Signed   By: Donavan Foil M.D.   On: 11/24/2017 19:29   Dg Abd 1 View  Result Date: 11/19/2017 CLINICAL DATA:  62 y/o  F; NG tube placement. EXAM: ABDOMEN - 1 VIEW COMPARISON:  11/19/2017 abdomen radiograph. FINDINGS: Normal bowel gas pattern. Enteric tube tip projects over gastric body. Mild levocurvature of the thoracolumbar junction. Bones are otherwise unremarkable. Surgical clips project over left hemiabdomen IMPRESSION: Enteric tube tip projects over gastric body. Electronically Signed   By: Kristine Garbe M.D.   On: 11/19/2017 22:22   Dg Abd 1 View  Result Date: 11/19/2017 CLINICAL DATA:  Patient pulled NG tube, NG tube placement EXAM: ABDOMEN - 1 VIEW COMPARISON:  11/19/2017 at 1312 hours FINDINGS: Nasogastric tube passes below the diaphragm to lie within the mid stomach. Multiple gallstones and right upper quadrant. Bowel gas pattern  is unremarkable. IMPRESSION: Well-positioned nasogastric tube. Electronically Signed   By: Lajean Manes M.D.   On: 11/19/2017 16:47   Dg Abd 1 View  Result Date: 11/19/2017 CLINICAL DATA:  Check nasogastric catheter placement EXAM: ABDOMEN - 1 VIEW COMPARISON:  11/19/2017 FINDINGS: Nasogastric catheter is again coiled upon itself but appears to be at least partially across the gastroesophageal junction. This should be advanced several cm. Multiple gallstones are again seen. Scattered nonobstructive bowel gas pattern is noted. IMPRESSION: Nasogastric catheter folded upon itself distally but appears to be at least partially in the stomach. This should be advanced several cm. Electronically Signed   By: Inez Catalina M.D.   On: 11/19/2017 14:05   Dg Abd 1 View  Result Date: 11/19/2017 CLINICAL DATA:  Evaluate nasogastric tube placement. EXAM: ABDOMEN - 1 VIEW COMPARISON:  11/18/2017.  CT 11/07/2017 FINDINGS: The nasogastric tube appears to be coiled up near the GE junction. The tube has been pulled back since the previous examination. Nonobstructive bowel gas pattern with gas in the transverse colon. IMPRESSION: Nasogastric tube has been pulled back compared to the previous examination. The tube appears to be coiled up near the GE junction. Electronically Signed   By: Markus Daft M.D.   On: 11/19/2017 10:22   Dg Abd 1 View  Result Date: 11/18/2017 CLINICAL DATA:  NG tube placement EXAM: ABDOMEN - 1 VIEW COMPARISON:  11/18/2017 FINDINGS: Esophageal tube tip overlies the expected location of gastric body. Right upper quadrant calcifications corresponding to gallstones. Visible upper gas pattern nonobstructed. IMPRESSION: Esophageal tube tip overlies expected location of gastric body. Electronically Signed   By: Donavan Foil M.D.   On: 11/18/2017 20:56   Dg Abd 1 View  Result Date: 11/18/2017 CLINICAL DATA:  Encounter for nasogastric tube placement. EXAM: ABDOMEN - 1 VIEW COMPARISON:  Radiograph of November 18, 2017. FINDINGS: The bowel gas pattern is normal. Distal tip of nasogastric tube is seen in expected position of proximal stomach. Surgical clips are noted in the abdomen. Cholelithiasis is noted. IMPRESSION: Distal tip of nasogastric tube seen in expected position of proximal stomach. No evidence of bowel obstruction or ileus. Electronically Signed   By: Marijo Conception, M.D.   On: 11/18/2017 12:19   Dg Abd 1 View  Result Date: 11/18/2017 CLINICAL DATA:  Nasogastric tube placement EXAM: ABDOMEN - 1 VIEW COMPARISON:  Portable exam 1013 hours compared to 11/17/2017 FINDINGS: Tip of nasogastric tube projects over the distal esophagus, recommend advancing tube 12 cm into stomach. Bowel gas pattern normal. Calcified gallstones in  RIGHT upper quadrant. LEFT mid abdomen surgical clips. No acute osseous findings. IMPRESSION: Tip of nasogastric tube is at the distal esophagus; recommend advancing tube 12 cm. Cholelithiasis. Electronically Signed   By: Lavonia Dana M.D.   On: 11/18/2017 10:53   Dg Abd 1 View  Result Date: 11/17/2017 CLINICAL DATA:  Nasogastric tube placement. EXAM: ABDOMEN - 1 VIEW COMPARISON:  Radiograph of November 16, 2017. FINDINGS: The bowel gas pattern is normal. Distal tip of nasogastric tube is seen in expected position of distal stomach. IMPRESSION: No evidence of bowel obstruction or ileus. Distal tip of nasogastric tube seen in expected position of distal stomach. Electronically Signed   By: Marijo Conception, M.D.   On: 11/17/2017 19:01   Dg Abd 1 View  Result Date: 11/14/2017 CLINICAL DATA:  Tracheostomy patient.  Nasogastric tube placement. EXAM: ABDOMEN - 1 VIEW COMPARISON:  Two days ago FINDINGS: Nasogastric tube tip overlaps the mid stomach. Diffuse bowel gas without obstructive pattern. No concerning mass effect or gas collection. Mild left base atelectasis. IMPRESSION: Nasogastric tube tip over the mid stomach. Electronically Signed   By: Monte Fantasia M.D.   On: 11/14/2017 14:39    Dg Abd 1 View  Result Date: 11/12/2017 CLINICAL DATA:  NG tube placement. EXAM: ABDOMEN - 1 VIEW COMPARISON:  11/09/2017 FINDINGS: An enteric tube terminates over the distal stomach, slightly more distal than on the prior study. Surgical clips are present in the mid abdomen. There is mild gaseous distension of predominantly large bowel loops in the included portion of the abdomen. No dilated loops of bowel suggestive of mechanical obstruction are identified. No intraperitoneal free air is identified on this supine study. No acute osseous abnormality is seen. IMPRESSION: Enteric tube in the distal stomach. Electronically Signed   By: Logan Bores M.D.   On: 11/12/2017 14:14   Dg Abd 1 View  Result Date: 11/09/2017 CLINICAL DATA:  Orogastric tube placement EXAM: ABDOMEN - 1 VIEW COMPARISON:  11/08/2017 FINDINGS: Gastric tube in the stomach with the tip in the body the stomach. Normal bowel gas pattern. Negative for bowel obstruction. IMPRESSION: Gastric tube body of stomach.  Normal bowel gas pattern. Electronically Signed   By: Franchot Gallo M.D.   On: 11/09/2017 19:02   Dg Abd 1 View  Result Date: 11/08/2017 CLINICAL DATA:  Orogastric  tube placement. EXAM: ABDOMEN - 1 VIEW COMPARISON:  None. FINDINGS: The orogastric tube extends well below the diaphragm. Tip projects in the distal stomach. IMPRESSION: Well-positioned orogastric tube, tip projecting in the distal stomach. Electronically Signed   By: Lajean Manes M.D.   On: 11/08/2017 17:07   Ct Head Wo Contrast  Result Date: 11/08/2017 CLINICAL DATA:  62 year old [name] with altered mental status. EXAM: CT HEAD WITHOUT CONTRAST TECHNIQUE: Contiguous axial images were obtained from the base of the skull through the vertex without intravenous contrast. COMPARISON:  None. FINDINGS: Evaluation of this exam is limited due to motion artifact. Brain: There is mild age-related atrophy and chronic microvascular ischemic changes. There is no acute  intracranial hemorrhage. No mass effect or midline shift. No extra-axial fluid collection. Vascular: Atherosclerotic calcification of the cerebral vasculature. Skull: Normal. Negative for fracture or focal lesion. Sinuses/Orbits: No acute finding. Other: None IMPRESSION: No definite acute intracranial hemorrhage on this severely motion degraded exam. Electronically Signed   By: Anner Crete M.D.   On: 11/08/2017 02:35   Mr Brain Wo Contrast  Result Date: 11/09/2017 CLINICAL DATA:  Initial evaluation for acute encephalopathy. Evaluate for new stroke. Left-sided weakness. EXAM: MRI HEAD WITHOUT CONTRAST TECHNIQUE: Multiplanar, multiecho pulse sequences of the brain and surrounding structures were obtained without intravenous contrast. COMPARISON:  Comparison made with prior CT from 11/08/2017. FINDINGS: Brain: Generalized age-related cerebral atrophy, with more pronounced cerebellar atrophy noted. Patchy T2/FLAIR hyperintensity within the periventricular and deep white matter both cerebral hemispheres, most consistent with chronic small vessel ischemic disease, mild for age. Superimposed remote lacunar infarcts present within the right basal ganglia and left thalamus as well as the pons. No abnormal foci of restricted diffusion to suggest acute or subacute ischemia. Gray-white matter differentiation maintained. No other is of chronic infarction. No acute or chronic intracranial hemorrhage. No mass lesion, midline shift or mass effect. No hydrocephalus. No extra-axial fluid or scar. Major dural sinuses are patent. Pituitary gland suprasellar region normal. Vascular: Major intracranial vascular flow voids are maintained. Skull and upper cervical spine: Craniocervical junction within normal limits. Upper cervical spine demonstrates mild degenerative spondylolysis without significant stenosis. Bone marrow signal intensity within normal limits. Mild dilatation of Foley noted. Scalp soft tissues within normal  limits. Sinuses/Orbits: Globes orbital soft tissues within normal limits. Scattered mucosal thickening throughout the paranasal sinuses. Layering fluid seen within the nasopharynx. Patient is intubated. Small bilateral mastoid effusions noted. Inner ear structures grossly normal. Other: None. IMPRESSION: 1. No acute intracranial infarct or other abnormality identified. 2. Mild chronic small vessel ischemic disease with small remote lacunar infarcts involving the right basal ganglia, left thalamus, and pons. Electronically Signed   By: Jeannine Boga M.D.   On: 11/09/2017 21:43   Dg Chest Port 1 View  Result Date: 11/24/2017 CLINICAL DATA:  Respiratory failure EXAM: PORTABLE CHEST 1 VIEW COMPARISON:  11/23/2017 FINDINGS: Cardiac shadow is mildly enlarged but stable. Tracheostomy tube and nasogastric catheter are again seen and stable. Lungs are well aerated bilaterally without focal infiltrate or sizable effusion. IMPRESSION: No acute abnormality noted. Electronically Signed   By: Inez Catalina M.D.   On: 11/24/2017 07:29   Dg Chest Port 1 View  Result Date: 11/23/2017 CLINICAL DATA:  Acute respiratory failure, tracheostomy patient, sepsis, acute encephalopathy, former smoker. EXAM: PORTABLE CHEST 1 VIEW COMPARISON:  Portable chest x-ray of November 18, 2017 FINDINGS: The lungs are well-expanded. The interstitial markings in the right lung are mildly increased overall as compared to the left.  There is no significant pleural effusion and no pneumothorax. The cardiac silhouette remains enlarged. The pulmonary vascularity is mildly engorged. The tracheostomy tube tip projects between the clavicular heads. The esophagogastric tube tip in proximal port project below the GE junction. IMPRESSION: Mild overall increase in the prominence of the pulmonary interstitium on the right. This may reflect asymmetric pulmonary edema of cardiac or noncardiac cause. A diffuse interstitial pneumonia is felt less likely. There is  no alveolar pneumonia on the right or left. Mild cardiomegaly with increased pulmonary vascular prominence. The support tubes are in reasonable position. Electronically Signed   By: David  Martinique M.D.   On: 11/23/2017 07:16   Dg Chest Port 1 View  Result Date: 11/18/2017 CLINICAL DATA:  Possible UTI. EXAM: PORTABLE CHEST 1 VIEW COMPARISON:  11/15/2017. FINDINGS: Tracheostomy tube noted with tip over the trachea. Interval removal of NG tube. Cardiomegaly with normal pulmonary vascularity. No focal infiltrate. No pleural effusion or pneumothorax. Degenerative change thoracic spine. IMPRESSION: 1. Interval removal of NG tube. Tracheostomy tube noted with tip over the trachea. 2. Cardiomegaly with normal pulmonary vascularity. No acute pulmonary disease noted. Electronically Signed   By: Marcello Moores  Register   On: 11/18/2017 09:41   Dg Chest Port 1 View  Result Date: 11/15/2017 CLINICAL DATA:  62 year old [name] admitted last week with sepsis. EXAM: PORTABLE CHEST 1 VIEW COMPARISON:  11/13/2017 and earlier. FINDINGS: Portable AP semi upright view at 0857 hours. Stable tracheostomy tube. Enteric tube courses to the left abdomen, tip not included. Mildly larger lung volumes. Decreased streaky opacity at the lung bases, residual greater on the left. No pneumothorax, pulmonary edema, pleural effusion or consolidation. Mediastinal contours are stable and within normal limits. Negative visible bowel gas pattern. No acute osseous abnormality identified. IMPRESSION: 1.  Stable lines and tubes. 2. Mild atelectasis.  No other acute cardiopulmonary abnormality. Electronically Signed   By: Genevie [name] M.D.   On: 11/15/2017 09:16   Dg Chest Port 1 View  Result Date: 11/13/2017 CLINICAL DATA:  Tracheostomy tube EXAM: PORTABLE CHEST 1 VIEW COMPARISON:  Yesterday FINDINGS: Tracheostomy tube in place. New nasogastric tube which reaches the stomach. There is a low volume chest with streaky opacities favoring atelectasis. No Kerley  lines, effusion, or pneumothorax. Cardiopericardial enlargement. IMPRESSION: 1. Lower lung volumes with increased atelectasis. 2. Cardiomegaly. Electronically Signed   By: Monte Fantasia M.D.   On: 11/13/2017 07:48   Dg Chest Port 1 View  Result Date: 11/12/2017 CLINICAL DATA:  Intermittent chest pain, recent leg fracture EXAM: PORTABLE CHEST 1 VIEW COMPARISON:  11/11/2017 FINDINGS: Interval tracheostomy tube placement in satisfactory position. Interval removal of the nasogastric tube. Right lower lobe airspace disease which may reflect atelectasis versus pneumonia. No pleural effusion. No pneumothorax. Stable cardiomegaly. No acute osseous abnormality. Mild osteoarthritis of bilateral glenohumeral joints. IMPRESSION: Interval tracheostomy tube placement in satisfactory position. Interval removal of the nasogastric tube. Right lower lobe airspace disease which may reflect atelectasis versus pneumonia. Electronically Signed   By: Kathreen Devoid   On: 11/12/2017 13:07   Dg Chest Port 1 View  Result Date: 11/11/2017 CLINICAL DATA:  Endotracheal tube position EXAM: PORTABLE CHEST 1 VIEW COMPARISON:  11/10/2017 FINDINGS: Endotracheal tube in good position.  Gastric tube in the stomach Bibasilar airspace disease left greater than right unchanged. No significant effusion. No pneumothorax. IMPRESSION: Bibasilar airspace disease left greater than right unchanged. Endotracheal tube in good position. Electronically Signed   By: Franchot Gallo M.D.   On: 11/11/2017 07:16  Dg Chest Port 1 View  Result Date: 11/10/2017 CLINICAL DATA:  ETT position EXAM: PORTABLE CHEST 1 VIEW COMPARISON:  Chest x-rays dated 11/09/2017 and 11/08/2017. FINDINGS: Endotracheal tube is well positioned with tip approximately 3 cm above the level of the carina. Enteric tube passes below the diaphragm. Left lower lobe opacities are stable, presumed atelectasis and/or small effusion. No new lung findings. No pneumothorax seen. IMPRESSION: 1.  Endotracheal tube well positioned with tip approximately 3 cm above the carina. 2. Persistent left lower lobe opacities, stable, probable atelectasis and/or small pleural effusion. No new lung findings. Electronically Signed   By: Franki Cabot M.D.   On: 11/10/2017 09:16   Dg Chest Port 1 View  Result Date: 11/09/2017 CLINICAL DATA:  Hypoxia EXAM: PORTABLE CHEST 1 VIEW COMPARISON:  November 08, 2017 FINDINGS: Endotracheal tube tip is 3.6 cm above the carina. Nasogastric tube tip and side port are below the diaphragm. No pneumothorax. There is airspace consolidation in the left base. Lungs elsewhere are clear. There is cardiomegaly with pulmonary vascularity within normal limits. No adenopathy. No evident bone lesions. IMPRESSION: Tube positions as described without pneumothorax. Patchy consolidation left base. Lungs elsewhere clear. Stable cardiomegaly. Electronically Signed   By: Lowella Grip III M.D.   On: 11/09/2017 08:02   Dg Chest Port 1 View  Result Date: 11/08/2017 CLINICAL DATA:  Status post ET tube placement. EXAM: PORTABLE CHEST 1 VIEW COMPARISON:  Portable film earlier in the day. FINDINGS: Cardiac enlargement. ET tube has been inserted, and lies 3.7 cm above carina. Nasogastric tube is coiled in the stomach. Aeration is improved with partial clearing of edema. No consolidation. IMPRESSION: Improved aeration post ET tube placement. Electronically Signed   By: Staci Righter M.D.   On: 11/08/2017 17:05   Dg Chest Port 1 View  Result Date: 11/08/2017 CLINICAL DATA:  62 year old [name] with hypoxia. EXAM: PORTABLE CHEST 1 VIEW COMPARISON:  Chest radiograph dated 11/07/2017 FINDINGS: Shallow inspiration with minimal bibasilar atelectasis. No focal consolidation, pleural effusion, or pneumothorax. There is borderline cardiomegaly with mild central vascular prominence which may represent mild vascular congestion. Chronic subluxed appearance of the left shoulder. No acute osseous pathology.  IMPRESSION: Probable mild vascular congestion.  No focal consolidation. Electronically Signed   By: Anner Crete M.D.   On: 11/08/2017 05:20   Dg Abd 2 Views  Result Date: 11/07/2017 CLINICAL DATA:  Diffuse abdominal pain EXAM: ABDOMEN - 2 VIEW COMPARISON:  None. FINDINGS: Scattered large and small bowel gas is noted. Mild retained fecal material is seen. No obstructive changes are noted. No free air is seen. No acute bony abnormality is noted. IMPRESSION: No acute abnormality seen. Electronically Signed   By: Inez Catalina M.D.   On: 11/07/2017 19:10   Dg Abd Portable 1v  Result Date: 11/16/2017 CLINICAL DATA:  Tachycardia.  Vomiting. EXAM: PORTABLE ABDOMEN - 1 VIEW COMPARISON:  11/14/2017. FINDINGS: Gastric tube remains within the stomach. There is moderate gaseous distention of small and large bowel. Distal colonic gas appears to be present. Query LEFT lung base consolidation or atelectasis. IMPRESSION: Moderate gaseous distension appears slightly increased from priors. Query ileus. NG tube in stomach. Electronically Signed   By: Staci Righter M.D.   On: 11/16/2017 09:25   Dg Swallowing Func-speech Pathology  Result Date: 11/26/2017 Objective Swallowing Evaluation: Type of Study: MBS-Modified Barium Swallow Study  Patient Details Name: Grace [name] MRN: [id number] Date of Birth: 10-Feb-1956 Today's Date: 11/26/2017 Time: SLP Start Time (ACUTE ONLY): 7425 -SLP Stop Time (  ACUTE ONLY): 1505 SLP Time Calculation (min) (ACUTE ONLY): 30 min Past Medical History: Past Medical History: Diagnosis Date . Hypertension  . Stroke [facility])   Left sided weakness  Past Surgical History: No past surgical history on file. HPI: Grace [name] 62 year old [name] with past medical history of CVA with left hemiparesis, hypertension and CAD who presented to the emergency department with fevers and elevated blood pressure.  She was admitted on 3/24 after being found encephalopathic and septic due to UTI.  On 3/25 she  had acute changes on mental status and PCCM was consulted. Patient was intubated for airway protection and worsening HCRF, patient self extubated on 3/26, failed with continued hypoxia and was remains intubated due to ongoing AMS.  Patient underwent trach 3/29.  Patient completed treatment for UTI per MD notes.  Patient developed possible ileus due to poor mobility.  Patient now on trach support with vent at night.  PMSV ordered.   Subjective: pt awake in chair Assessment / Plan / Recommendation CHL IP CLINICAL IMPRESSIONS 11/26/2017 Clinical Impression Patient presents with moderate oropharyngeal dysphagia without aspiration.  She did demonstrate premature spillage of barium into pharynx, piecemealing and decreased timing of laryngeal closure/swallow resulting in laryngeal penetration of thin liquids.   Pharyngeal swallow fortunately is strong without residuals!  Pt did demonstrate reflexive cough with deeper penetration of thin liquids.  Recommend avoid NG tube and allow pt to have dys1/nectar diet with full supervision and pmsv in place.  Hopefully pt may be able to avoid PEG.  Using live video, educated pt to findings/recommendations.   SLP Visit Diagnosis Dysphagia, oropharyngeal phase (R13.12) Attention and concentration deficit following -- Frontal lobe and executive function deficit following -- Impact on safety and function Moderate aspiration risk   CHL IP TREATMENT RECOMMENDATION 11/26/2017 Treatment Recommendations Therapy as outlined in treatment plan below   Prognosis 11/26/2017 Prognosis for Safe Diet Advancement Fair Barriers to Reach Goals -- Barriers/Prognosis Comment -- CHL IP DIET RECOMMENDATION 11/26/2017 SLP Diet Recommendations Dysphagia 1 (Puree) solids;Nectar thick liquid Liquid Administration via Cup;Straw Medication Administration Whole meds with puree Compensations Slow rate;Small sips/bites;Other (Comment) Postural Changes Seated upright at 90 degrees;Remain semi-upright after after  feeds/meals (Comment)   CHL IP OTHER RECOMMENDATIONS 11/26/2017 Recommended Consults -- Oral Care Recommendations Oral care QID;Oral care before and after PO Other Recommendations Have oral suction available   CHL IP FOLLOW UP RECOMMENDATIONS 11/26/2017 Follow up Recommendations Skilled Nursing facility   [facility] IP FREQUENCY AND DURATION 11/26/2017 Speech Therapy Frequency (ACUTE ONLY) min 2x/week Treatment Duration 2 weeks      CHL IP ORAL PHASE 11/26/2017 Oral Phase Impaired Oral - Pudding Teaspoon -- Oral - Pudding Cup -- Oral - Honey Teaspoon -- Oral - Honey Cup -- Oral - Nectar Teaspoon Premature spillage;Lingual/palatal residue;Piecemeal swallowing Oral - Nectar Cup Decreased bolus cohesion;Premature spillage;Delayed oral transit;Lingual/palatal residue;Piecemeal swallowing Oral - Nectar Straw Decreased bolus cohesion;Premature spillage;Delayed oral transit;Lingual/palatal residue;Piecemeal swallowing Oral - Thin Teaspoon Premature spillage;Decreased bolus cohesion;Delayed oral transit;Lingual/palatal residue;Piecemeal swallowing Oral - Thin Cup Premature spillage;Decreased bolus cohesion;Delayed oral transit;Lingual/palatal residue;Piecemeal swallowing Oral - Thin Straw Decreased bolus cohesion;Premature spillage;Lingual/palatal residue;Piecemeal swallowing Oral - Puree Premature spillage;Decreased bolus cohesion;Delayed oral transit;Lingual/palatal residue;Piecemeal swallowing Oral - Mech Soft Decreased bolus cohesion;Premature spillage;Weak lingual manipulation;Delayed oral transit;Lingual/palatal residue Oral - Regular -- Oral - Multi-Consistency -- Oral - Pill Weak lingual manipulation;Delayed oral transit;Lingual/palatal residue Oral Phase - Comment --  CHL IP PHARYNGEAL PHASE 11/26/2017 Pharyngeal Phase Impaired Pharyngeal- Pudding Teaspoon -- Pharyngeal -- Pharyngeal- Pudding Cup --  Pharyngeal -- Pharyngeal- Honey Teaspoon -- Pharyngeal -- Pharyngeal- Honey Cup -- Pharyngeal -- Pharyngeal- Nectar Teaspoon  Delayed swallow initiation-vallecula Pharyngeal -- Pharyngeal- Nectar Cup Delayed swallow initiation-vallecula Pharyngeal -- Pharyngeal- Nectar Straw Delayed swallow initiation-vallecula Pharyngeal -- Pharyngeal- Thin Teaspoon Delayed swallow initiation-vallecula Pharyngeal -- Pharyngeal- Thin Cup Penetration/Aspiration during swallow;Penetration/Apiration after swallow;Delayed swallow initiation-vallecula Pharyngeal Material enters airway, remains ABOVE vocal cords and not ejected out Pharyngeal- Thin Straw Delayed swallow initiation-vallecula;Penetration/Aspiration during swallow;Penetration/Apiration after swallow Pharyngeal Material enters airway, remains ABOVE vocal cords and not ejected out;Material enters airway, CONTACTS cords and then ejected out Pharyngeal- Puree Delayed swallow initiation-vallecula Pharyngeal -- Pharyngeal- Mechanical Soft Delayed swallow initiation-vallecula Pharyngeal -- Pharyngeal- Regular -- Pharyngeal -- Pharyngeal- Multi-consistency -- Pharyngeal -- Pharyngeal- Pill Delayed swallow initiation-vallecula Pharyngeal -- Pharyngeal Comment --  CHL IP CERVICAL ESOPHAGEAL PHASE 11/26/2017 Cervical Esophageal Phase WFL Pudding Teaspoon -- Pudding Cup -- Honey Teaspoon -- Honey Cup -- Nectar Teaspoon -- Nectar Cup -- Nectar Straw -- Thin Teaspoon -- Thin Cup -- Thin Straw -- Puree -- Mechanical Soft -- Regular -- Multi-consistency -- Pill -- Cervical Esophageal Comment -- Luanna Salk, MS [facility] SLP [phone] 11/26/2017, 4:35 PM                CBC Recent Labs  Lab 11/23/17 0309 11/24/17 0814 11/25/17 0526  WBC 10.9* 9.7 7.3  HGB 9.2* 9.1* 8.8*  HCT 30.1* 29.4* 28.7*  PLT 563* 570* 572*  MCV 89.6 88.6 89.7  MCH 27.4 27.4 27.5  MCHC 30.6 31.0 30.7  RDW 14.9 14.8 14.9    Chemistries  Recent Labs  Lab 11/23/17 0309 11/24/17 0814 11/25/17 0526 11/27/17 1143  NA 139 135 137  --   K 4.3 4.1 4.3  --   CL 102 97* 98*  --   CO2 _0 --   GLUCOSE 217* 214* 139*  --   BUN  _1 --   CREATININE 0.74 0.81 0.74 0.94  CALCIUM 9.3 9.1 9.3  --   MG 2.0  --   --   --    ------------------------------------------------------------------------------------------------------------------ No results for input(s): CHOL, HDL, LDLCALC, TRIG, CHOLHDL, LDLDIRECT in the last 72 hours.  Lab Results  Component Value Date   HGBA1C 6.7 (H) 11/08/2017   ------------------------------------------------------------------------------------------------------------------ No results for input(s): TSH, T4TOTAL, T3FREE, THYROIDAB in the last 72 hours.  Invalid input(s): FREET3 ------------------------------------------------------------------------------------------------------------------ No results for input(s): VITAMINB12, FOLATE, FERRITIN, TIBC, IRON, RETICCTPCT in the last 72 hours.  Coagulation profile No results for input(s): INR, PROTIME in the last 168 hours.  No results for input(s): DDIMER in the last 72 hours.  Cardiac Enzymes No results for input(s): CKMB, TROPONINI, MYOGLOBIN in the last 168 hours.  Invalid input(s): CK ------------------------------------------------------------------------------------------------------------------ No results found for: BNP   Grace [name] M.D on 11/27/2017 at 6:10 PM  Between 7am to 7pm - Pager - [phone]  After 7pm go to [email or web address] - password [email or web address]  [facility] -  Office  [phone]   Voice Recognition Viviann Spare dictation system was used to create this note, attempts have been made to correct errors. Please contact the author with questions and/or clarifications.

## 2017-11-27 NOTE — Progress Notes (Signed)
Hypoglycemic Event CBG 67 4oz OJ given. Will recheck in 15 mins

## 2017-11-27 NOTE — Progress Notes (Signed)
CBG- 92 

## 2017-11-27 NOTE — Progress Notes (Signed)
PT on 28% Fi02 (5 lpm 02)- Sp02 99%, RR 18, BBS clr-dim, HR 84. Inner cannula cleaned, PT has dry dressing at this time, trach tie secure, 0 cuff pressure (cuffless trach #6 SH). Suctioned PT trach times 2 post feeding- resulted in white, thick secretions (PT tolerated well). Per Policy all emergency equipment is PT room at this time.

## 2017-11-28 LAB — CBC
HCT: 31.4 % — ABNORMAL LOW (ref 36.0–46.0)
HEMOGLOBIN: 9.7 g/dL — AB (ref 12.0–15.0)
MCH: 27.6 pg (ref 26.0–34.0)
MCHC: 30.9 g/dL (ref 30.0–36.0)
MCV: 89.5 fL (ref 78.0–100.0)
Platelets: 622 10*3/uL — ABNORMAL HIGH (ref 150–400)
RBC: 3.51 MIL/uL — ABNORMAL LOW (ref 3.87–5.11)
RDW: 14.9 % (ref 11.5–15.5)
WBC: 5.7 10*3/uL (ref 4.0–10.5)

## 2017-11-28 LAB — CULTURE, BLOOD (ROUTINE X 2)
Culture: NO GROWTH
SPECIAL REQUESTS: ADEQUATE
Special Requests: ADEQUATE

## 2017-11-28 LAB — GLUCOSE, CAPILLARY
GLUCOSE-CAPILLARY: 119 mg/dL — AB (ref 65–99)
Glucose-Capillary: 204 mg/dL — ABNORMAL HIGH (ref 65–99)
Glucose-Capillary: 111 mg/dL — ABNORMAL HIGH (ref 65–99)
Glucose-Capillary: 143 mg/dL — ABNORMAL HIGH (ref 65–99)

## 2017-11-28 NOTE — Progress Notes (Signed)
Lillia AbedLindsay, RN requested she and RN student complete trach care for learning / teaching purposes for 0800 rounds. All emergency equipment is in room at this time.

## 2017-11-28 NOTE — Progress Notes (Signed)
1200 round trach check by RT completed at approximately 1410. 28% (5 lpm ) ATC with Sp02 98%, HR 80, RR 17, BBS coarse crackles- diminished. Trach tie secure, site oozing secretions - dried and placed new drain sponge, inner cannula cleaned, all emergency equipment in room at this time, suctioned times 1- tolerated well- resulted in small, tan, thick secretions.

## 2017-11-28 NOTE — Progress Notes (Signed)
Patient Demographics:    Grace [name], is a 62 y.o. female, DOB - 1956/03/29, BCW:[id number]  Admit date - 11/07/2017   Admitting Physician Elwin Mocha, MD  Outpatient Primary MD for the patient is Duffy Bruce, Manya Silvas, MD  LOS - 21   Chief Complaint  Patient presents with  . Abdominal Pain  . Fever        Subjective:    Grace [name] today has no fevers,  tolerating oral intake well, follow commands pretty well  Assessment  & Plan :    Principal Problem:   Acute encephalopathy Active Problems:   Sepsis ([place])   Acute respiratory failure with hypoxemia [facility])   Hypertensive emergency   Hypoxia   Brief Narrative:  Grace Bordeaux75 year old female with past medical history of CVA with left hemiparesis, hypertension and CAD who presented to the emergency department with fevers and elevated blood pressure. She was admitted on 3/24 after being found encephalopathic and septic due to UTI. On 3/25 she had acute changes on mental status and PCCM was consulted. Patient was intubated for airway protection and worsening HCRF, patient self extubated on 3/26, failed with continued hypoxia and was re-intubated due to ongoing AMS. Patient underwent trach 3/29, ventilator was weaned slowly. Patient blood pressure has improved. Patient completed treatment for UTI. Patient developed possible ileus due to poor mobility, which now has resolved. Patient now on trach collar. Nutrition via NGT. SLP working on [employer] and voice. Due to continued fevers, repeat cultures were obtained and with concern for aspiration (patient had removed her NGT numerous times), empiric unasyn was started. on 11/26/17- she did well with modified barium swallow, speech pathologist recommends oral feeding. Continue IV Vanco started on 11/24/2017 for a total of 7 days from first negative culture pending final ID and  sensitivity on blood cultures from 11/25/2017, if  repeat blood cultures from 11/27/2017 is still positive consider TEE (d/w Dr Linus Salmons- ID on 11/28/17-he can call us with this plan)    Plan:- 1)Acute metabolic encephalopathy-  felt to be multifactorial from hypertensive encephalopathy (PRES), infectious process and hypercarbia. --Cognitively improved,  follow commands.   2)Acute respiratory failure with hypoxia and hypercarbia in setting of acute encephalopathy Status post trach/vent dependent - slow weaned off the vent, trach has ben changed to #6cuffless 11/22/17, tolerated well. Continue supportive O2 supplement. Speech workingon PMSV and swallow eval.patient with very slow recovery, patient's husband agreeable to PEG tube  placement if needed. On 11/26/17 she did well with modified barium swallow, speech pathologist recommends oral feeding.,  Appears to be tolerating oral feeding well at this time   3)Sepsis E. coli UTI - resolved- Completed treatment, initially treated with vancomycin and Zosyn subsequently changed to Rocephin x 5 days last day of treatment was 11/16/17  4) methicillin-resistant coagulase-negative staph bacteremia-  fever has resolved,   Repeat blood culture from 11/23/17 with methicillin-resistant coagulase-negative staph, repeat blood cultures from 11/25/2017 also with gram-positive cocci possibly same organism from 11/23/2017 Repeat CXR without consolidation.   Continue IV Vanco started on 11/24/2017 for a total of 7 days from first negative culture pending final ID and sensitivity on blood cultures from 11/25/2017, if  repeat blood cultures from 11/27/2017 is still positive consider TEE (d/w Dr  Comer- ID on 11/28/17-he can call us with this plan)  5)Hypertensive Urgency/Elevated troponin - stable, continue amlodipine 10 mg daily, hydralazine 100 mg every 8 hours, clonidine 0.2 mg 3 times daily, labetalol 200 mg 3 times daily, okay to use additional IV hydralazine or IV labetalol for  elevated BP  6)HLD- Continue lipitor   7)Oral candidiasis and vulvovaginal candidiasis- c/n  Diflucan for a total of 7 doses  8)Anemia of chronic disease Hemoglobin stable. No signs of overt bleeding  9)Diabetes mellitus type II A1c 6.7 Lantus 25 units subcu daily,, Novolog adjusted per DM coordinator recommendations   10)HFpEF-with history of chronic diastolic CHF , last known EF 55-60%, Seems to be compensated at this time Monitor for signs of fluid overload  11)Severe deconditioning Will need SNF on discharge   12)FEN- on 11/26/17- patient pulled out the nasal feeding tube, she did well with modified barium swallow, speech pathologist recommends oral feeding,  patient's husband Grace [name] is agreeable to PEG tube placement if needed.  For now patient appears to be tolerating oral intake well  DVT prophylaxis: Lovenox Code Status: Full Family Communication:  spoke with husband Johhnny by phone Disposition Plan:    SNF placement as long as tolerating  oral intake and afebrile (we need to complete IV vancomycin as above)  Consultants:   PCCM   Procedures:   ETT 3/25>> 3/29  Trach 3/29 MBS - 11/26/17  DVT Prophylaxis  :  Lovenox   Lab Results  Component Value Date   PLT 622 (H) 11/28/2017   Inpatient Medications  Scheduled Meds: . amLODipine  10 mg Oral Daily  . atorvastatin  30 mg Oral QHS  . bisacodyl  10 mg Rectal Daily  . chlorhexidine gluconate (MEDLINE KIT)  15 mL Mouth Rinse BID  . cloNIDine  0.2 mg Oral TID  . clopidogrel  75 mg Oral Daily  . enoxaparin (LOVENOX) injection  40 mg Subcutaneous Q24H  . hydrALAZINE  100 mg Oral Q8H  . insulin aspart  0-15 Units Subcutaneous Q4H  . insulin glargine  25 Units Subcutaneous Daily  . labetalol  200 mg Oral TID  . mouth rinse  15 mL Mouth Rinse QID  . metoCLOPramide (REGLAN) injection  5 mg Intravenous Q8H  . pantoprazole sodium  40 mg Oral Q1200  . polyethylene glycol  17 g Oral Daily  .  senna-docusate  1 tablet Oral BID  . sodium chloride flush  3 mL Intravenous Q12H   Continuous Infusions: . sodium chloride Stopped (11/28/17 0931)  . fluconazole (DIFLUCAN) IV Stopped (11/28/17 1248)  . vancomycin Stopped (11/28/17 1500)   PRN Meds:.sodium chloride, acetaminophen (TYLENOL) oral liquid 160 mg/5 mL **OR** acetaminophen, hydrALAZINE, iopamidol, labetalol, LORazepam, nitroGLYCERIN, ondansetron **OR** ondansetron (ZOFRAN) IV, RESOURCE THICKENUP CLEAR    Anti-infectives (From admission, onward)   Start     Dose/Rate Route Frequency Ordered Stop   11/25/17 1000  vancomycin (VANCOCIN) 1,250 mg in sodium chloride 0.9 % 250 mL IVPB     1,250 mg 166.7 mL/hr over 90 Minutes Intravenous Every 24 hours 11/24/17 1449     11/24/17 1600  vancomycin (VANCOCIN) 1,250 mg in sodium chloride 0.9 % 250 mL IVPB     1,250 mg 166.7 mL/hr over 90 Minutes Intravenous  Once 11/24/17 1449 11/24/17 1734   11/23/17 1800  Ampicillin-Sulbactam (UNASYN) 3 g in sodium chloride 0.9 % 100 mL IVPB  Status:  Discontinued     3 g 200 mL/hr over 30 Minutes Intravenous Every 6 hours 11/23/17  1549 11/24/17 1437   11/23/17 1100  fluconazole (DIFLUCAN) IVPB 200 mg     200 mg 100 mL/hr over 60 Minutes Intravenous Every 24 hours 11/23/17 0926 11/30/17 1059   11/15/17 2245  fluconazole (DIFLUCAN) 40 MG/ML suspension 200 mg     200 mg Per Tube Daily 11/15/17 2243 11/17/17 0931   11/10/17 1400  cefTRIAXone (ROCEPHIN) 2 g in sodium chloride 0.9 % 100 mL IVPB  Status:  Discontinued     2 g 200 mL/hr over 30 Minutes Intravenous Every 24 hours 11/10/17 1002 11/15/17 0849   11/08/17 1000  vancomycin (VANCOCIN) IVPB 1000 mg/200 mL premix  Status:  Discontinued     1,000 mg 200 mL/hr over 60 Minutes Intravenous Every 24 hours 11/08/17 0409 11/10/17 0958   11/08/17 0400  piperacillin-tazobactam (ZOSYN) IVPB 3.375 g  Status:  Discontinued     3.375 g 12.5 mL/hr over 240 Minutes Intravenous Every 8 hours 11/07/17 2325  11/10/17 1001   11/07/17 1845  piperacillin-tazobactam (ZOSYN) IVPB 3.375 g     3.375 g 100 mL/hr over 30 Minutes Intravenous STAT 11/07/17 1835 11/07/17 2131   11/07/17 1830  vancomycin (VANCOCIN) IVPB 1000 mg/200 mL premix     1,000 mg 200 mL/hr over 60 Minutes Intravenous  Once 11/07/17 1828 11/07/17 2345   11/07/17 1830  piperacillin-tazobactam (ZOSYN) IVPB 2.25 g  Status:  Discontinued     2.25 g 100 mL/hr over 30 Minutes Intravenous Every 6 hours 11/07/17 1828 11/07/17 1835        Objective:   Vitals:   11/28/17 1126 11/28/17 1127 11/28/17 1128 11/28/17 1400  BP: (!) 152/80 (!) 152/80  124/87  Pulse:   88 85  Resp:    18  Temp:    99.3 F (37.4 C)  TempSrc:    Oral  SpO2:    98%  Weight:      Height:        Wt Readings from Last 3 Encounters:  11/27/17 86.1 kg (189 lb 13.1 oz)     Intake/Output Summary (Last 24 hours) at 11/28/2017 1739 Last data filed at 11/28/2017 1128 Gross per 24 hour  Intake 253 ml  Output 750 ml  Net -497 ml    Physical Exam  Gen:- Awake Alert, to follow commands HEENT:- NCAT, pupils are reactive Neck- #6 cuffless trach with trach collar  lungs-scattered rhonchi bilaterally and movement is fairly symmetrical  CV- S1, S2 normal Abd-  +ve B.Sounds, Abd Soft, No tenderness,    Extremity/Skin:-Skin is warm and dry  psych-affect is appropriate, able to follow commands  neuro-left hemiplegia   Data Review:   Micro Results Recent Results (from the past 240 hour(s))  Culture, blood (Routine X 2) w Reflex to ID Panel     Status: None   Collection Time: 11/23/17 10:33 AM  Result Value Ref Range Status   Specimen Description   Final    BLOOD RIGHT HAND Performed at [facility] [address] [place], [place] [address]    Special Requests   Final    BOTTLES DRAWN AEROBIC AND ANAEROBIC Blood Culture adequate volume Performed at [facility] [address] [place], [place] [address]    Culture    Final    NO GROWTH 5 DAYS Performed at [facility], [place] [address], [place], [place] [address]    Report Status 11/28/2017 FINAL  Final  Urine Culture     Status: None   Collection Time:  11/23/17 10:41 AM  Result Value Ref Range Status   Specimen Description   Final    URINE, CATHETERIZED Performed at [facility] [address], [place], [place] [address]    Special Requests   Final    NONE Performed at [facility], [place] [address] [place], [place] [address]    Culture   Final    NO GROWTH Performed at [facility], [name] [address] [place], [place] [address]    Report Status 11/24/2017 FINAL  Final  Culture, blood (Routine X 2) w Reflex to ID Panel     Status: Abnormal   Collection Time: 11/23/17 11:11 AM  Result Value Ref Range Status   Specimen Description   Final    BLOOD RIGHT HAND Performed at [facility] [address], [place], [place] [address]    Special Requests   Final    BOTTLES DRAWN AEROBIC AND ANAEROBIC Blood Culture adequate volume Performed at [facility] [address] [place], [place] [address]    Culture  Setup Time   Final    GRAM POSITIVE COCCI IN BOTH AEROBIC AND ANAEROBIC BOTTLES CRITICAL RESULT CALLED TO, READ BACK BY AND VERIFIED WITH: Guadlupe Spanish PharmD 14:25 11/24/17 ([email or web address]) Performed at [facility], [place] [address], [place], [place] [address]    Culture STAPHYLOCOCCUS SPECIES (COAGULASE NEGATIVE) (A)  Final   Report Status 11/27/2017 FINAL  Final   Organism ID, Bacteria STAPHYLOCOCCUS SPECIES (COAGULASE NEGATIVE)  Final      Susceptibility   Staphylococcus species (coagulase negative) - MIC*    CIPROFLOXACIN 1 SENSITIVE Sensitive     ERYTHROMYCIN >=8 RESISTANT Resistant     GENTAMICIN <=0.5 SENSITIVE Sensitive     OXACILLIN SENSITIVE Sensitive     TETRACYCLINE <=1 SENSITIVE Sensitive     VANCOMYCIN <=0.5 SENSITIVE Sensitive      TRIMETH/SULFA <=10 SENSITIVE Sensitive     CLINDAMYCIN INTERMEDIATE Intermediate     RIFAMPIN <=0.5 SENSITIVE Sensitive     Inducible Clindamycin NEGATIVE Sensitive     * STAPHYLOCOCCUS SPECIES (COAGULASE NEGATIVE)  Blood Culture ID Panel (Reflexed)     Status: Abnormal   Collection Time: 11/23/17 11:11 AM  Result Value Ref Range Status   Enterococcus species NOT DETECTED NOT DETECTED Final   Listeria monocytogenes NOT DETECTED NOT DETECTED Final   Staphylococcus species DETECTED (A) NOT DETECTED Final    Comment: Methicillin (oxacillin) resistant coagulase negative staphylococcus. Possible blood culture contaminant (unless isolated from more than one blood culture draw or clinical case suggests pathogenicity). No antibiotic treatment is indicated for blood  culture contaminants. CRITICAL RESULT CALLED TO, READ BACK BY AND VERIFIED WITH: Guadlupe Spanish PharmD 14:25 11/24/17 (wilsonm)    Staphylococcus aureus NOT DETECTED NOT DETECTED Final   Methicillin resistance DETECTED (A) NOT DETECTED Final    Comment: CRITICAL RESULT CALLED TO, READ BACK BY AND VERIFIED WITH: Guadlupe Spanish PharmD 14:25 11/24/17 ([email or web address])    Streptococcus species NOT DETECTED NOT DETECTED Final   Streptococcus agalactiae NOT DETECTED NOT DETECTED Final   Streptococcus pneumoniae NOT DETECTED NOT DETECTED Final   Streptococcus pyogenes NOT DETECTED NOT DETECTED Final   Acinetobacter baumannii NOT DETECTED NOT DETECTED Final   Enterobacteriaceae species NOT DETECTED NOT DETECTED Final   Enterobacter cloacae complex NOT DETECTED NOT DETECTED Final   Escherichia coli NOT DETECTED NOT DETECTED Final   Klebsiella oxytoca NOT DETECTED NOT DETECTED Final   Klebsiella pneumoniae NOT DETECTED NOT DETECTED  Final   Proteus species NOT DETECTED NOT DETECTED Final   Serratia marcescens NOT DETECTED NOT DETECTED Final   Haemophilus influenzae NOT DETECTED NOT DETECTED Final   Neisseria meningitidis NOT DETECTED NOT DETECTED Final    Pseudomonas aeruginosa NOT DETECTED NOT DETECTED Final   Candida albicans NOT DETECTED NOT DETECTED Final   Candida glabrata NOT DETECTED NOT DETECTED Final   Candida krusei NOT DETECTED NOT DETECTED Final   Candida parapsilosis NOT DETECTED NOT DETECTED Final   Candida tropicalis NOT DETECTED NOT DETECTED Final    Comment: Performed at [facility], [place] [address], [place], [place] [address]  Culture, blood (routine x 2)     Status: None (Preliminary result)   Collection Time: 11/25/17  5:25 AM  Result Value Ref Range Status   Specimen Description   Final    BLOOD RIGHT HAND Performed at [facility] [address], [place], [place] [address]    Special Requests   Final    BOTTLES DRAWN AEROBIC ONLY Blood Culture adequate volume Performed at [facility] [address], [place], [place] [address]    Culture   Final    NO GROWTH 3 DAYS Performed at [facility], [place] [address] [place], [place] [address]    Report Status PENDING  Incomplete  Culture, blood (routine x 2)     Status: Abnormal   Collection Time: 11/25/17  5:38 AM  Result Value Ref Range Status   Specimen Description   Final    BLOOD LEFT HAND Performed at [facility] [address], [place], [place] [address]    Special Requests   Final    BOTTLES DRAWN AEROBIC AND ANAEROBIC Blood Culture adequate volume   Culture  Setup Time   Final    GRAM POSITIVE COCCI IN BOTH AEROBIC AND ANAEROBIC BOTTLES CRITICAL RESULT CALLED TO, READ BACK BY AND VERIFIED WITH: M RENZ PHARM D 11/26/17 AT 900 BY CM    Culture (A)  Final    STAPHYLOCOCCUS SPECIES (COAGULASE NEGATIVE) SUSCEPTIBILITIES PERFORMED ON PREVIOUS CULTURE WITHIN THE LAST 5 DAYS. Performed at [facility], [place] [address] [place], [place] [address]    Report Status 11/28/2017 FINAL  Final  Culture, blood (Routine X 2) w Reflex to ID Panel     Status: None (Preliminary result)    Collection Time: 11/27/17  6:40 PM  Result Value Ref Range Status   Specimen Description BLOOD LEFT HAND  Final   Special Requests   Final    BOTTLES DRAWN AEROBIC ONLY Blood Culture adequate volume   Culture   Final    NO GROWTH < 12 HOURS Performed at [facility], [place] [address] [place], [place] [address]    Report Status PENDING  Incomplete  Culture, blood (Routine X 2) w Reflex to ID Panel     Status: None (Preliminary result)   Collection Time: 11/27/17  6:40 PM  Result Value Ref Range Status   Specimen Description BLOOD LEFT FOREARM  Final   Special Requests   Final    BOTTLES DRAWN AEROBIC ONLY Blood Culture results may not be optimal due to an inadequate volume of blood received in culture bottles   Culture   Final    NO GROWTH < 12 HOURS Performed at [facility], [place] [address], [place],  [address]    Report Status PENDING  Incomplete    Radiology Reports Ct Abdomen  Pelvis Wo Contrast  Result Date: 11/07/2017 CLINICAL DATA:  Abdominal pain EXAM: CT ABDOMEN AND PELVIS WITHOUT CONTRAST TECHNIQUE: Multidetector CT imaging of the abdomen and pelvis was performed following the standard protocol without IV contrast. The examination was ordered as a CT angiography study, but there was infiltration during the contrast injection. COMPARISON:  None. FINDINGS: Lower chest: Coronary artery calcifications.  No pleural effusion. Hepatobiliary: Normal hepatic contours and density. No intra- or extrahepatic biliary dilatation. Cholelithiasis without acute inflammation. Pancreas: Normal parenchymal contours without ductal dilatation. No peripancreatic fluid collection. Spleen: Small spleen. Adrenals/Urinary Tract: --Adrenal glands: Normal. --Right kidney/ureter: Suspected excretion of contrast. Otherwise normal kidneys. --Left kidney/ureter: Suspected excretion of contrast, otherwise normal kidneys. --Urinary bladder: Small amount of excreted contrast in the urinary bladder.  Stomach/Bowel: --Stomach/Duodenum: No hiatal hernia or other gastric abnormality. Normal duodenal course. --Small bowel: No dilatation or inflammation. --Colon: No focal abnormality. --Appendix: Normal. Vascular/Lymphatic: Atherosclerotic calcification is present within the non-aneurysmal abdominal aorta, without hemodynamically significant stenosis. No abdominal or pelvic lymphadenopathy. Reproductive: Status post hysterectomy. No adnexal mass. Musculoskeletal. No bony spinal canal stenosis or focal osseous abnormality. Other: None. IMPRESSION: 1. No acute abnormality of the abdomen or pelvis. 2. Aortic Atherosclerosis (ICD10-I70.0). Coronary artery atherosclerosis. Electronically Signed   By: Ulyses Jarred M.D.   On: 11/07/2017 22:41   Dg Chest 1 View  Result Date: 11/09/2017 CLINICAL DATA:  Endotracheal tube placement EXAM: CHEST  1 VIEW COMPARISON:  11/09/2017 FINDINGS: Endotracheal tube remains in good position. Gastric tube in the stomach Left lower lobe consolidation unchanged from earlier today. Right lung clear. Mild hypoventilation with decreased lung volume IMPRESSION: Endotracheal tube in good position. Left lower lobe atelectasis/infiltrate unchanged. Electronically Signed   By: Franchot Gallo M.D.   On: 11/09/2017 19:03   Dg Chest 2 View  Result Date: 11/07/2017 CLINICAL DATA:  Abdominal pain and shortness of Breath EXAM: CHEST - 2 VIEW COMPARISON:  None. FINDINGS: Cardiac shadow is mildly enlarged. The lungs are well aerated bilaterally. No sizable effusion is seen. Downward displacement of the left humeral head is noted. This is likely related to some chronic subluxation. Clinical correlation is recommended. IMPRESSION: No acute abnormality in the chest. Downward displacement of the left humeral head likely related to subluxation. Correlate with the physical exam. Electronically Signed   By: Inez Catalina M.D.   On: 11/07/2017 19:08   Dg Abd 1 View  Result Date: 11/24/2017 CLINICAL DATA:   NG tube placement EXAM: ABDOMEN - 1 VIEW COMPARISON:  11/19/2017 FINDINGS: Esophageal tube tip overlies the gastric body, side-port overlies the proximal stomach. Slight increased bowel gas within the abdomen without definitive obstructive pattern. IMPRESSION: Esophageal tube tip overlies the gastric body region. Electronically Signed   By: Donavan Foil M.D.   On: 11/24/2017 19:29   Dg Abd 1 View  Result Date: 11/19/2017 CLINICAL DATA:  62 y/o  F; NG tube placement. EXAM: ABDOMEN - 1 VIEW COMPARISON:  11/19/2017 abdomen radiograph. FINDINGS: Normal bowel gas pattern. Enteric tube tip projects over gastric body. Mild levocurvature of the thoracolumbar junction. Bones are otherwise unremarkable. Surgical clips project over left hemiabdomen IMPRESSION: Enteric tube tip projects over gastric body. Electronically Signed   By: Kristine Garbe M.D.   On: 11/19/2017 22:22   Dg Abd 1 View  Result Date: 11/19/2017 CLINICAL DATA:  Patient pulled NG tube, NG tube placement EXAM: ABDOMEN - 1 VIEW COMPARISON:  11/19/2017 at 1312 hours FINDINGS: Nasogastric tube passes below the diaphragm to lie within the mid  stomach. Multiple gallstones and right upper quadrant. Bowel gas pattern is unremarkable. IMPRESSION: Well-positioned nasogastric tube. Electronically Signed   By: Lajean Manes M.D.   On: 11/19/2017 16:47   Dg Abd 1 View  Result Date: 11/19/2017 CLINICAL DATA:  Check nasogastric catheter placement EXAM: ABDOMEN - 1 VIEW COMPARISON:  11/19/2017 FINDINGS: Nasogastric catheter is again coiled upon itself but appears to be at least partially across the gastroesophageal junction. This should be advanced several cm. Multiple gallstones are again seen. Scattered nonobstructive bowel gas pattern is noted. IMPRESSION: Nasogastric catheter folded upon itself distally but appears to be at least partially in the stomach. This should be advanced several cm. Electronically Signed   By: Inez Catalina M.D.   On:  11/19/2017 14:05   Dg Abd 1 View  Result Date: 11/19/2017 CLINICAL DATA:  Evaluate nasogastric tube placement. EXAM: ABDOMEN - 1 VIEW COMPARISON:  11/18/2017.  CT 11/07/2017 FINDINGS: The nasogastric tube appears to be coiled up near the GE junction. The tube has been pulled back since the previous examination. Nonobstructive bowel gas pattern with gas in the transverse colon. IMPRESSION: Nasogastric tube has been pulled back compared to the previous examination. The tube appears to be coiled up near the GE junction. Electronically Signed   By: Markus Daft M.D.   On: 11/19/2017 10:22   Dg Abd 1 View  Result Date: 11/18/2017 CLINICAL DATA:  NG tube placement EXAM: ABDOMEN - 1 VIEW COMPARISON:  11/18/2017 FINDINGS: Esophageal tube tip overlies the expected location of gastric body. Right upper quadrant calcifications corresponding to gallstones. Visible upper gas pattern nonobstructed. IMPRESSION: Esophageal tube tip overlies expected location of gastric body. Electronically Signed   By: Donavan Foil M.D.   On: 11/18/2017 20:56   Dg Abd 1 View  Result Date: 11/18/2017 CLINICAL DATA:  Encounter for nasogastric tube placement. EXAM: ABDOMEN - 1 VIEW COMPARISON:  Radiograph of November 18, 2017. FINDINGS: The bowel gas pattern is normal. Distal tip of nasogastric tube is seen in expected position of proximal stomach. Surgical clips are noted in the abdomen. Cholelithiasis is noted. IMPRESSION: Distal tip of nasogastric tube seen in expected position of proximal stomach. No evidence of bowel obstruction or ileus. Electronically Signed   By: Marijo Conception, M.D.   On: 11/18/2017 12:19   Dg Abd 1 View  Result Date: 11/18/2017 CLINICAL DATA:  Nasogastric tube placement EXAM: ABDOMEN - 1 VIEW COMPARISON:  Portable exam 1013 hours compared to 11/17/2017 FINDINGS: Tip of nasogastric tube projects over the distal esophagus, recommend advancing tube 12 cm into stomach. Bowel gas pattern normal. Calcified gallstones in   RIGHT upper quadrant. LEFT mid abdomen surgical clips. No acute osseous findings. IMPRESSION: Tip of nasogastric tube is at the distal esophagus; recommend advancing tube 12 cm. Cholelithiasis. Electronically Signed   By: Lavonia Dana M.D.   On: 11/18/2017 10:53   Dg Abd 1 View  Result Date: 11/17/2017 CLINICAL DATA:  Nasogastric tube placement. EXAM: ABDOMEN - 1 VIEW COMPARISON:  Radiograph of November 16, 2017. FINDINGS: The bowel gas pattern is normal. Distal tip of nasogastric tube is seen in expected position of distal stomach. IMPRESSION: No evidence of bowel obstruction or ileus. Distal tip of nasogastric tube seen in expected position of distal stomach. Electronically Signed   By: Marijo Conception, M.D.   On: 11/17/2017 19:01   Dg Abd 1 View  Result Date: 11/14/2017 CLINICAL DATA:  Tracheostomy patient.  Nasogastric tube placement. EXAM: ABDOMEN - 1 VIEW COMPARISON:  Two days ago FINDINGS: Nasogastric tube tip overlaps the mid stomach. Diffuse bowel gas without obstructive pattern. No concerning mass effect or gas collection. Mild left base atelectasis. IMPRESSION: Nasogastric tube tip over the mid stomach. Electronically Signed   By: Monte Fantasia M.D.   On: 11/14/2017 14:39   Dg Abd 1 View  Result Date: 11/12/2017 CLINICAL DATA:  NG tube placement. EXAM: ABDOMEN - 1 VIEW COMPARISON:  11/09/2017 FINDINGS: An enteric tube terminates over the distal stomach, slightly more distal than on the prior study. Surgical clips are present in the mid abdomen. There is mild gaseous distension of predominantly large bowel loops in the included portion of the abdomen. No dilated loops of bowel suggestive of mechanical obstruction are identified. No intraperitoneal free air is identified on this supine study. No acute osseous abnormality is seen. IMPRESSION: Enteric tube in the distal stomach. Electronically Signed   By: Logan Bores M.D.   On: 11/12/2017 14:14   Dg Abd 1 View  Result Date: 11/09/2017 CLINICAL  DATA:  Orogastric tube placement EXAM: ABDOMEN - 1 VIEW COMPARISON:  11/08/2017 FINDINGS: Gastric tube in the stomach with the tip in the body the stomach. Normal bowel gas pattern. Negative for bowel obstruction. IMPRESSION: Gastric tube body of stomach.  Normal bowel gas pattern. Electronically Signed   By: Franchot Gallo M.D.   On: 11/09/2017 19:02   Dg Abd 1 View  Result Date: 11/08/2017 CLINICAL DATA:  Orogastric tube placement. EXAM: ABDOMEN - 1 VIEW COMPARISON:  None. FINDINGS: The orogastric tube extends well below the diaphragm. Tip projects in the distal stomach. IMPRESSION: Well-positioned orogastric tube, tip projecting in the distal stomach. Electronically Signed   By: Lajean Manes M.D.   On: 11/08/2017 17:07   Ct Head Wo Contrast  Result Date: 11/08/2017 CLINICAL DATA:  62 year old female with altered mental status. EXAM: CT HEAD WITHOUT CONTRAST TECHNIQUE: Contiguous axial images were obtained from the base of the skull through the vertex without intravenous contrast. COMPARISON:  None. FINDINGS: Evaluation of this exam is limited due to motion artifact. Brain: There is mild age-related atrophy and chronic microvascular ischemic changes. There is no acute intracranial hemorrhage. No mass effect or midline shift. No extra-axial fluid collection. Vascular: Atherosclerotic calcification of the cerebral vasculature. Skull: Normal. Negative for fracture or focal lesion. Sinuses/Orbits: No acute finding. Other: None IMPRESSION: No definite acute intracranial hemorrhage on this severely motion degraded exam. Electronically Signed   By: Anner Crete M.D.   On: 11/08/2017 02:35   Mr Brain Wo Contrast  Result Date: 11/09/2017 CLINICAL DATA:  Initial evaluation for acute encephalopathy. Evaluate for new stroke. Left-sided weakness. EXAM: MRI HEAD WITHOUT CONTRAST TECHNIQUE: Multiplanar, multiecho pulse sequences of the brain and surrounding structures were obtained without intravenous contrast.  COMPARISON:  Comparison made with prior CT from 11/08/2017. FINDINGS: Brain: Generalized age-related cerebral atrophy, with more pronounced cerebellar atrophy noted. Patchy T2/FLAIR hyperintensity within the periventricular and deep white matter both cerebral hemispheres, most consistent with chronic small vessel ischemic disease, mild for age. Superimposed remote lacunar infarcts present within the right basal ganglia and left thalamus as well as the pons. No abnormal foci of restricted diffusion to suggest acute or subacute ischemia. Gray-white matter differentiation maintained. No other is of chronic infarction. No acute or chronic intracranial hemorrhage. No mass lesion, midline shift or mass effect. No hydrocephalus. No extra-axial fluid or scar. Major dural sinuses are patent. Pituitary gland suprasellar region normal. Vascular: Major intracranial vascular flow voids are maintained. Skull  and upper cervical spine: Craniocervical junction within normal limits. Upper cervical spine demonstrates mild degenerative spondylolysis without significant stenosis. Bone marrow signal intensity within normal limits. Mild dilatation of Foley noted. Scalp soft tissues within normal limits. Sinuses/Orbits: Globes orbital soft tissues within normal limits. Scattered mucosal thickening throughout the paranasal sinuses. Layering fluid seen within the nasopharynx. Patient is intubated. Small bilateral mastoid effusions noted. Inner ear structures grossly normal. Other: None. IMPRESSION: 1. No acute intracranial infarct or other abnormality identified. 2. Mild chronic small vessel ischemic disease with small remote lacunar infarcts involving the right basal ganglia, left thalamus, and pons. Electronically Signed   By: Jeannine Boga M.D.   On: 11/09/2017 21:43   Dg Chest Port 1 View  Result Date: 11/24/2017 CLINICAL DATA:  Respiratory failure EXAM: PORTABLE CHEST 1 VIEW COMPARISON:  11/23/2017 FINDINGS: Cardiac shadow is  mildly enlarged but stable. Tracheostomy tube and nasogastric catheter are again seen and stable. Lungs are well aerated bilaterally without focal infiltrate or sizable effusion. IMPRESSION: No acute abnormality noted. Electronically Signed   By: Inez Catalina M.D.   On: 11/24/2017 07:29   Dg Chest Port 1 View  Result Date: 11/23/2017 CLINICAL DATA:  Acute respiratory failure, tracheostomy patient, sepsis, acute encephalopathy, former smoker. EXAM: PORTABLE CHEST 1 VIEW COMPARISON:  Portable chest x-ray of November 18, 2017 FINDINGS: The lungs are well-expanded. The interstitial markings in the right lung are mildly increased overall as compared to the left. There is no significant pleural effusion and no pneumothorax. The cardiac silhouette remains enlarged. The pulmonary vascularity is mildly engorged. The tracheostomy tube tip projects between the clavicular heads. The esophagogastric tube tip in proximal port project below the GE junction. IMPRESSION: Mild overall increase in the prominence of the pulmonary interstitium on the right. This may reflect asymmetric pulmonary edema of cardiac or noncardiac cause. A diffuse interstitial pneumonia is felt less likely. There is no alveolar pneumonia on the right or left. Mild cardiomegaly with increased pulmonary vascular prominence. The support tubes are in reasonable position. Electronically Signed   By: David  Martinique M.D.   On: 11/23/2017 07:16   Dg Chest Port 1 View  Result Date: 11/18/2017 CLINICAL DATA:  Possible UTI. EXAM: PORTABLE CHEST 1 VIEW COMPARISON:  11/15/2017. FINDINGS: Tracheostomy tube noted with tip over the trachea. Interval removal of NG tube. Cardiomegaly with normal pulmonary vascularity. No focal infiltrate. No pleural effusion or pneumothorax. Degenerative change thoracic spine. IMPRESSION: 1. Interval removal of NG tube. Tracheostomy tube noted with tip over the trachea. 2. Cardiomegaly with normal pulmonary vascularity. No acute pulmonary  disease noted. Electronically Signed   By: Marcello Moores  Register   On: 11/18/2017 09:41   Dg Chest Port 1 View  Result Date: 11/15/2017 CLINICAL DATA:  62 year old female admitted last week with sepsis. EXAM: PORTABLE CHEST 1 VIEW COMPARISON:  11/13/2017 and earlier. FINDINGS: Portable AP semi upright view at 0857 hours. Stable tracheostomy tube. Enteric tube courses to the left abdomen, tip not included. Mildly larger lung volumes. Decreased streaky opacity at the lung bases, residual greater on the left. No pneumothorax, pulmonary edema, pleural effusion or consolidation. Mediastinal contours are stable and within normal limits. Negative visible bowel gas pattern. No acute osseous abnormality identified. IMPRESSION: 1.  Stable lines and tubes. 2. Mild atelectasis.  No other acute cardiopulmonary abnormality. Electronically Signed   By: Genevie [name] M.D.   On: 11/15/2017 09:16   Dg Chest Port 1 View  Result Date: 11/13/2017 CLINICAL DATA:  Tracheostomy tube EXAM: PORTABLE  CHEST 1 VIEW COMPARISON:  Yesterday FINDINGS: Tracheostomy tube in place. New nasogastric tube which reaches the stomach. There is a low volume chest with streaky opacities favoring atelectasis. No Kerley lines, effusion, or pneumothorax. Cardiopericardial enlargement. IMPRESSION: 1. Lower lung volumes with increased atelectasis. 2. Cardiomegaly. Electronically Signed   By: Monte Fantasia M.D.   On: 11/13/2017 07:48   Dg Chest Port 1 View  Result Date: 11/12/2017 CLINICAL DATA:  Intermittent chest pain, recent leg fracture EXAM: PORTABLE CHEST 1 VIEW COMPARISON:  11/11/2017 FINDINGS: Interval tracheostomy tube placement in satisfactory position. Interval removal of the nasogastric tube. Right lower lobe airspace disease which may reflect atelectasis versus pneumonia. No pleural effusion. No pneumothorax. Stable cardiomegaly. No acute osseous abnormality. Mild osteoarthritis of bilateral glenohumeral joints. IMPRESSION: Interval tracheostomy tube  placement in satisfactory position. Interval removal of the nasogastric tube. Right lower lobe airspace disease which may reflect atelectasis versus pneumonia. Electronically Signed   By: Kathreen Devoid   On: 11/12/2017 13:07   Dg Chest Port 1 View  Result Date: 11/11/2017 CLINICAL DATA:  Endotracheal tube position EXAM: PORTABLE CHEST 1 VIEW COMPARISON:  11/10/2017 FINDINGS: Endotracheal tube in good position.  Gastric tube in the stomach Bibasilar airspace disease left greater than right unchanged. No significant effusion. No pneumothorax. IMPRESSION: Bibasilar airspace disease left greater than right unchanged. Endotracheal tube in good position. Electronically Signed   By: Franchot Gallo M.D.   On: 11/11/2017 07:16   Dg Chest Port 1 View  Result Date: 11/10/2017 CLINICAL DATA:  ETT position EXAM: PORTABLE CHEST 1 VIEW COMPARISON:  Chest x-rays dated 11/09/2017 and 11/08/2017. FINDINGS: Endotracheal tube is well positioned with tip approximately 3 cm above the level of the carina. Enteric tube passes below the diaphragm. Left lower lobe opacities are stable, presumed atelectasis and/or small effusion. No new lung findings. No pneumothorax seen. IMPRESSION: 1. Endotracheal tube well positioned with tip approximately 3 cm above the carina. 2. Persistent left lower lobe opacities, stable, probable atelectasis and/or small pleural effusion. No new lung findings. Electronically Signed   By: Franki Cabot M.D.   On: 11/10/2017 09:16   Dg Chest Port 1 View  Result Date: 11/09/2017 CLINICAL DATA:  Hypoxia EXAM: PORTABLE CHEST 1 VIEW COMPARISON:  November 08, 2017 FINDINGS: Endotracheal tube tip is 3.6 cm above the carina. Nasogastric tube tip and side port are below the diaphragm. No pneumothorax. There is airspace consolidation in the left base. Lungs elsewhere are clear. There is cardiomegaly with pulmonary vascularity within normal limits. No adenopathy. No evident bone lesions. IMPRESSION: Tube positions as  described without pneumothorax. Patchy consolidation left base. Lungs elsewhere clear. Stable cardiomegaly. Electronically Signed   By: Lowella Grip III M.D.   On: 11/09/2017 08:02   Dg Chest Port 1 View  Result Date: 11/08/2017 CLINICAL DATA:  Status post ET tube placement. EXAM: PORTABLE CHEST 1 VIEW COMPARISON:  Portable film earlier in the day. FINDINGS: Cardiac enlargement. ET tube has been inserted, and lies 3.7 cm above carina. Nasogastric tube is coiled in the stomach. Aeration is improved with partial clearing of edema. No consolidation. IMPRESSION: Improved aeration post ET tube placement. Electronically Signed   By: Staci Righter M.D.   On: 11/08/2017 17:05   Dg Chest Port 1 View  Result Date: 11/08/2017 CLINICAL DATA:  62 year old female with hypoxia. EXAM: PORTABLE CHEST 1 VIEW COMPARISON:  Chest radiograph dated 11/07/2017 FINDINGS: Shallow inspiration with minimal bibasilar atelectasis. No focal consolidation, pleural effusion, or pneumothorax. There is borderline cardiomegaly  with mild central vascular prominence which may represent mild vascular congestion. Chronic subluxed appearance of the left shoulder. No acute osseous pathology. IMPRESSION: Probable mild vascular congestion.  No focal consolidation. Electronically Signed   By: Anner Crete M.D.   On: 11/08/2017 05:20   Dg Abd 2 Views  Result Date: 11/07/2017 CLINICAL DATA:  Diffuse abdominal pain EXAM: ABDOMEN - 2 VIEW COMPARISON:  None. FINDINGS: Scattered large and small bowel gas is noted. Mild retained fecal material is seen. No obstructive changes are noted. No free air is seen. No acute bony abnormality is noted. IMPRESSION: No acute abnormality seen. Electronically Signed   By: Inez Catalina M.D.   On: 11/07/2017 19:10   Dg Abd Portable 1v  Result Date: 11/16/2017 CLINICAL DATA:  Tachycardia.  Vomiting. EXAM: PORTABLE ABDOMEN - 1 VIEW COMPARISON:  11/14/2017. FINDINGS: Gastric tube remains within the stomach. There  is moderate gaseous distention of small and large bowel. Distal colonic gas appears to be present. Query LEFT lung base consolidation or atelectasis. IMPRESSION: Moderate gaseous distension appears slightly increased from priors. Query ileus. NG tube in stomach. Electronically Signed   By: Staci Righter M.D.   On: 11/16/2017 09:25   Dg Swallowing Func-speech Pathology  Result Date: 11/26/2017 Objective Swallowing Evaluation: Type of Study: MBS-Modified Barium Swallow Study  Patient Details Name: Jakaylee Sasaki MRN: [id number] Date of Birth: [birth year] Today's Date: 11/26/2017 Time: SLP Start Time (ACUTE ONLY): 1435 -SLP Stop Time (ACUTE ONLY): 1505 SLP Time Calculation (min) (ACUTE ONLY): 30 min Past Medical History: Past Medical History: Diagnosis Date . Hypertension  . Stroke [facility])   Left sided weakness  Past Surgical History: No past surgical history on file. HPI: Davi Rotan 62 year old female with past medical history of CVA with left hemiparesis, hypertension and CAD who presented to the emergency department with fevers and elevated blood pressure.  She was admitted on 3/24 after being found encephalopathic and septic due to UTI.  On 3/25 she had acute changes on mental status and PCCM was consulted. Patient was intubated for airway protection and worsening HCRF, patient self extubated on 3/26, failed with continued hypoxia and was remains intubated due to ongoing AMS.  Patient underwent trach 3/29.  Patient completed treatment for UTI per MD notes.  Patient developed possible ileus due to poor mobility.  Patient now on trach support with vent at night.  PMSV ordered.   Subjective: pt awake in chair Assessment / Plan / Recommendation CHL IP CLINICAL IMPRESSIONS 11/26/2017 Clinical Impression Patient presents with moderate oropharyngeal dysphagia without aspiration.  She did demonstrate premature spillage of barium into pharynx, piecemealing and decreased timing of laryngeal closure/swallow resulting  in laryngeal penetration of thin liquids.   Pharyngeal swallow fortunately is strong without residuals!  Pt did demonstrate reflexive cough with deeper penetration of thin liquids.  Recommend avoid NG tube and allow pt to have dys1/nectar diet with full supervision and pmsv in place.  Hopefully pt may be able to avoid PEG.  Using live video, educated pt to findings/recommendations.   SLP Visit Diagnosis Dysphagia, oropharyngeal phase (R13.12) Attention and concentration deficit following -- Frontal lobe and executive function deficit following -- Impact on safety and function Moderate aspiration risk   CHL IP TREATMENT RECOMMENDATION 11/26/2017 Treatment Recommendations Therapy as outlined in treatment plan below   Prognosis 11/26/2017 Prognosis for Safe Diet Advancement Fair Barriers to Reach Goals -- Barriers/Prognosis Comment -- CHL IP DIET RECOMMENDATION 11/26/2017 SLP Diet Recommendations Dysphagia 1 (Puree) solids;Nectar thick liquid Liquid Administration  via Cup;Straw Medication Administration Whole meds with puree Compensations Slow rate;Small sips/bites;Other (Comment) Postural Changes Seated upright at 90 degrees;Remain semi-upright after after feeds/meals (Comment)   CHL IP OTHER RECOMMENDATIONS 11/26/2017 Recommended Consults -- Oral Care Recommendations Oral care QID;Oral care before and after PO Other Recommendations Have oral suction available   CHL IP FOLLOW UP RECOMMENDATIONS 11/26/2017 Follow up Recommendations Skilled Nursing facility   [facility] IP FREQUENCY AND DURATION 11/26/2017 Speech Therapy Frequency (ACUTE ONLY) min 2x/week Treatment Duration 2 weeks      CHL IP ORAL PHASE 11/26/2017 Oral Phase Impaired Oral - Pudding Teaspoon -- Oral - Pudding Cup -- Oral - Honey Teaspoon -- Oral - Honey Cup -- Oral - Nectar Teaspoon Premature spillage;Lingual/palatal residue;Piecemeal swallowing Oral - Nectar Cup Decreased bolus cohesion;Premature spillage;Delayed oral transit;Lingual/palatal residue;Piecemeal  swallowing Oral - Nectar Straw Decreased bolus cohesion;Premature spillage;Delayed oral transit;Lingual/palatal residue;Piecemeal swallowing Oral - Thin Teaspoon Premature spillage;Decreased bolus cohesion;Delayed oral transit;Lingual/palatal residue;Piecemeal swallowing Oral - Thin Cup Premature spillage;Decreased bolus cohesion;Delayed oral transit;Lingual/palatal residue;Piecemeal swallowing Oral - Thin Straw Decreased bolus cohesion;Premature spillage;Lingual/palatal residue;Piecemeal swallowing Oral - Puree Premature spillage;Decreased bolus cohesion;Delayed oral transit;Lingual/palatal residue;Piecemeal swallowing Oral - Mech Soft Decreased bolus cohesion;Premature spillage;Weak lingual manipulation;Delayed oral transit;Lingual/palatal residue Oral - Regular -- Oral - Multi-Consistency -- Oral - Pill Weak lingual manipulation;Delayed oral transit;Lingual/palatal residue Oral Phase - Comment --  CHL IP PHARYNGEAL PHASE 11/26/2017 Pharyngeal Phase Impaired Pharyngeal- Pudding Teaspoon -- Pharyngeal -- Pharyngeal- Pudding Cup -- Pharyngeal -- Pharyngeal- Honey Teaspoon -- Pharyngeal -- Pharyngeal- Honey Cup -- Pharyngeal -- Pharyngeal- Nectar Teaspoon Delayed swallow initiation-vallecula Pharyngeal -- Pharyngeal- Nectar Cup Delayed swallow initiation-vallecula Pharyngeal -- Pharyngeal- Nectar Straw Delayed swallow initiation-vallecula Pharyngeal -- Pharyngeal- Thin Teaspoon Delayed swallow initiation-vallecula Pharyngeal -- Pharyngeal- Thin Cup Penetration/Aspiration during swallow;Penetration/Apiration after swallow;Delayed swallow initiation-vallecula Pharyngeal Material enters airway, remains ABOVE vocal cords and not ejected out Pharyngeal- Thin Straw Delayed swallow initiation-vallecula;Penetration/Aspiration during swallow;Penetration/Apiration after swallow Pharyngeal Material enters airway, remains ABOVE vocal cords and not ejected out;Material enters airway, CONTACTS cords and then ejected out Pharyngeal-  Puree Delayed swallow initiation-vallecula Pharyngeal -- Pharyngeal- Mechanical Soft Delayed swallow initiation-vallecula Pharyngeal -- Pharyngeal- Regular -- Pharyngeal -- Pharyngeal- Multi-consistency -- Pharyngeal -- Pharyngeal- Pill Delayed swallow initiation-vallecula Pharyngeal -- Pharyngeal Comment --  CHL IP CERVICAL ESOPHAGEAL PHASE 11/26/2017 Cervical Esophageal Phase WFL Pudding Teaspoon -- Pudding Cup -- Honey Teaspoon -- Honey Cup -- Nectar Teaspoon -- Nectar Cup -- Nectar Straw -- Thin Teaspoon -- Thin Cup -- Thin Straw -- Puree -- Mechanical Soft -- Regular -- Multi-consistency -- Pill -- Cervical Esophageal Comment -- Luanna Salk, MS [facility] SLP [phone] 11/26/2017, 4:35 PM                CBC Recent Labs  Lab 11/23/17 0309 11/24/17 0814 11/25/17 0526 11/28/17 0455  WBC 10.9* 9.7 7.3 5.7  HGB 9.2* 9.1* 8.8* 9.7*  HCT 30.1* 29.4* 28.7* 31.4*  PLT 563* 570* 572* 622*  MCV 89.6 88.6 89.7 89.5  MCH 27.4 27.4 27.5 27.6  MCHC 30.6 31.0 30.7 30.9  RDW 14.9 14.8 14.9 14.9    Chemistries  Recent Labs  Lab 11/23/17 0309 11/24/17 0814 11/25/17 0526 11/27/17 1143  NA 139 135 137  --   K 4.3 4.1 4.3  --   CL 102 97* 98*  --   CO2 27 26 30   --   GLUCOSE 217* 214* 139*  --   BUN 12 14 11   --   CREATININE 0.74 0.81 0.74 0.94  CALCIUM 9.3 9.1  9.3  --   MG 2.0  --   --   --    ------------------------------------------------------------------------------------------------------------------ No results for input(s): CHOL, HDL, LDLCALC, TRIG, CHOLHDL, LDLDIRECT in the last 72 hours.  Lab Results  Component Value Date   HGBA1C 6.7 (H) 11/08/2017   ------------------------------------------------------------------------------------------------------------------ No results for input(s): TSH, T4TOTAL, T3FREE, THYROIDAB in the last 72 hours.  Invalid input(s):  FREET3 ------------------------------------------------------------------------------------------------------------------ No results for input(s): VITAMINB12, FOLATE, FERRITIN, TIBC, IRON, RETICCTPCT in the last 72 hours.  Coagulation profile No results for input(s): INR, PROTIME in the last 168 hours.  No results for input(s): DDIMER in the last 72 hours.  Cardiac Enzymes No results for input(s): CKMB, TROPONINI, MYOGLOBIN in the last 168 hours.  Invalid input(s): CK ------------------------------------------------------------------------------------------------------------------ No results found for: BNP   Roxan Hockey M.D on 11/28/2017 at 5:39 PM  Between 7am to 7pm - Pager - [phone]  After 7pm go to [email or web address] - password [email or web address]  [facility] -  Office  [phone]   Voice Recognition Viviann Spare dictation system was used to create this note, attempts have been made to correct errors. Please contact the author with questions and/or clarifications.

## 2017-11-28 NOTE — Progress Notes (Signed)
Report given to on-coming nurse and care of this patient transferred to another nurse at this time.

## 2017-11-28 NOTE — Progress Notes (Signed)
Placed PT on new ATC set up. 

## 2017-11-29 DIAGNOSIS — Z930 Tracheostomy status: Secondary | ICD-10-CM

## 2017-11-29 LAB — GLUCOSE, CAPILLARY
GLUCOSE-CAPILLARY: 88 mg/dL (ref 65–99)
GLUCOSE-CAPILLARY: 211 mg/dL — AB (ref 65–99)
GLUCOSE-CAPILLARY: 89 mg/dL (ref 65–99)
GLUCOSE-CAPILLARY: 127 mg/dL — AB (ref 65–99)
Glucose-Capillary: 110 mg/dL — ABNORMAL HIGH (ref 65–99)
Glucose-Capillary: 210 mg/dL — ABNORMAL HIGH (ref 65–99)
Glucose-Capillary: 76 mg/dL (ref 65–99)

## 2017-11-29 LAB — CREATININE, SERUM
Creatinine, Ser: 0.81 mg/dL (ref 0.44–1.00)
GFR calc Af Amer: >60 mL/min (ref >60)
GFR calc non Af Amer: >60 mL/min (ref >60)

## 2017-11-29 MED ORDER — LINEZOLID 600 MG PO TABS
600.0000 mg | ORAL_TABLET | Freq: Two times a day (BID) | ORAL | Status: DC
  Administered 2017-11-29 – 2017-11-30 (×2): 600 mg via ORAL
  Filled 2017-11-29 (×2): qty 1

## 2017-11-29 MED ORDER — METOCLOPRAMIDE HCL 5 MG PO TABS
5.0000 mg | ORAL_TABLET | Freq: Four times a day (QID) | ORAL | Status: DC | PRN
  Administered 2017-11-29: 5 mg via ORAL
  Filled 2017-11-29: qty 1

## 2017-11-29 NOTE — Progress Notes (Signed)
Nutrition Follow-up  DOCUMENTATION CODES:   Obesity unspecified  INTERVENTION:   Magic cup TID with meals, each supplement provides 290 kcal and 9 grams of protein  NUTRITION DIAGNOSIS:   Inadequate oral intake related to inability to eat as evidenced by NPO status.  Now on Dysphagia I with Nectar thick liquids consuming 75%  GOAL:   Patient will meet greater than or equal to 90% of their needs  Improving  MONITOR:   PO intake, Supplement acceptance, Weight trends, Labs   ASSESSMENT:   62 yo female admitted from SNF on 3/24 with abdominal pain secondary to sepsis, AMS. PMH HTN, stroke, DM  Pt working with SLP. Discussed pt with RN.  Pt pulled NGT on 04/12, TF stopped.  RN reports pt has been eating and drinking thickened liquids well, consumed 75% of breakfast this morning. Pt enjoying Magic Cups and applesauce.   Labs reviewed; CBG 76-211, Hemoglobin 9.7 Medications reviewed; sliding scale insulin, Lantus, Reglan, Protonix   Diet Order:  DIET - DYS 1 Room service appropriate? No; Fluid consistency: Nectar Thick  EDUCATION NEEDS:   Not appropriate for education at this time  Skin:  Other: excoriated marks BL arms with ecchymosis  Last BM:  11/28/17  Height:   Ht Readings from Last 1 Encounters:  11/11/17 5\' 4"  (1.626 m)    Weight:   Wt Readings from Last 1 Encounters:  11/27/17 189 lb 13.1 oz (86.1 kg)    Ideal Body Weight:  54.5 kg  BMI:  Body mass index is 32.58 kg/m.  Estimated Nutritional Needs:   Kcal:  1590-1770 (18-20 kcal/kg)  Protein:  80-90 grams  Fluid:  >=1.8 L/day  Wylene SimmerAllison Rochanda Harpham, MS, RDN, LDN 11/29/2017 1:00 PM

## 2017-11-29 NOTE — [place] FL2 (Signed)
[place] MEDICAID FL2 LEVEL OF CARE SCREENING TOOL     IDENTIFICATION  Patient Name: Grace [name] Birthdate: 1956/01/17 Sex: female Admission Date (Current Location): 11/07/2017  [facility] and [place] Number:  Producer, television/film/video and Address:  [facility] [place], [address] [place], [place] [address]      Provider Number: [id number]  Attending Physician Name and Address:  [name], MD  Relative Name and Phone Number:       Current Level of Care: Hospital Recommended Level of Care: Skilled Nursing Facility Prior Approval Number:    Date Approved/Denied:   PASRR Number:    Discharge Plan: SNF    Current Diagnoses: Patient Active Problem List   Diagnosis Date Noted  . Acute encephalopathy   . Acute respiratory failure with hypoxemia (HCC)   . Hypertensive emergency   . Hypoxia   . Sepsis (HCC) 11/07/2017    Orientation RESPIRATION BLADDER Height & Weight     Self  Tracheostomy Incontinent Weight: 189 lb 13.1 oz (86.1 kg) Height:  5\' 4"  (162.6 cm)  BEHAVIORAL SYMPTOMS/MOOD NEUROLOGICAL BOWEL NUTRITION STATUS      Incontinent Diet(dysphasia 1, nectar-thick fluids)  AMBULATORY STATUS COMMUNICATION OF NEEDS Skin   Extensive Assist Verbally Normal                       Personal Care Assistance Level of Assistance  Bathing, Feeding, Dressing Bathing Assistance: Maximum assistance Feeding assistance: Limited assistance Dressing Assistance: Maximum assistance     Functional Limitations Info  Sight, Hearing, Speech Sight Info: Adequate Hearing Info: Adequate Speech Info: Impaired(aphasia)    SPECIAL CARE FACTORS FREQUENCY                       Contractures Contractures Info: Not present    Additional Factors Info  Code Status, Allergies Code Status Info: full code Allergies Info: nka           Current Medications (11/29/2017):  This is the current hospital active medication list Current  Facility-Administered Medications  Medication Dose Route Frequency Provider Last Rate Last Dose  . 0.9 %  sodium chloride infusion   Intravenous PRN Coralyn Helling, MD   Stopped at 11/28/17 [phone]  . acetaminophen (TYLENOL) solution 650 mg  650 mg Oral Q6H PRN Emokpae, Courage, MD   650 mg at 11/29/17 1050   Or  . acetaminophen (TYLENOL) suppository 650 mg  650 mg Rectal Q6H PRN Emokpae, Courage, MD      . amLODipine (NORVASC) tablet 10 mg  10 mg Oral Daily Emokpae, Courage, MD   10 mg at 11/29/17 1017  . atorvastatin (LIPITOR) tablet 30 mg  30 mg Oral QHS Emokpae, Courage, MD   30 mg at 11/28/17 2212  . cloNIDine (CATAPRES) tablet 0.2 mg  0.2 mg Oral TID Mariea Clonts, Courage, MD   0.2 mg at 11/29/17 1017  . clopidogrel (PLAVIX) tablet 75 mg  75 mg Oral Daily Emokpae, Courage, MD   75 mg at 11/29/17 1016  . enoxaparin (LOVENOX) injection 40 mg  40 mg Subcutaneous Q24H Haydee Salter, MD   40 mg at 11/29/17 1016  . hydrALAZINE (APRESOLINE) injection 10 mg  10 mg Intravenous Q4H PRN Randel Pigg, Dorma Russell, MD      . hydrALAZINE (APRESOLINE) tablet 100 mg  100 mg Oral Q8H Simonne Martinet, NP   100 mg at 11/29/17 1324  . insulin aspart (novoLOG) injection 0-15 Units  0-15  Units Subcutaneous Q4H Haydee SalterHobbs, Phillip M, MD   5 Units at 11/29/17 1222  . insulin glargine (LANTUS) injection 25 Units  25 Units Subcutaneous Daily Noralee StainChoi, Jennifer, DO   25 Units at 11/29/17 1015  . iopamidol (ISOVUE-370) 76 % injection 100 mL  100 mL Intravenous Once PRN Graciella FreerLayden, Lindsey A, PA-C      . labetalol (NORMODYNE) tablet 200 mg  200 mg Oral TID Mariea ClontsEmokpae, Courage, MD   200 mg at 11/29/17 1017  . labetalol (NORMODYNE,TRANDATE) injection 10 mg  10 mg Intravenous Q2H PRN Randel PiggSilva Zapata, Dorma RussellEdwin, MD      . LORazepam (ATIVAN) injection 1 mg  1 mg Intravenous Q4H PRN Coralyn HellingSood, Vineet, MD   1 mg at 11/28/17 2213  . MEDLINE mouth rinse  15 mL Mouth Rinse QID Ollis, Brandi L, NP   15 mL at 11/29/17 1223  . metoCLOPramide (REGLAN) injection 5 mg  5 mg  Intravenous Q8H Simonne MartinetBabcock, Peter E, NP   5 mg at 11/29/17 [id number]  . nitroGLYCERIN (NITROSTAT) SL tablet 0.4 mg  0.4 mg Sublingual Q5 min PRN Haydee SalterHobbs, Phillip M, MD      . ondansetron [facility](ZOFRAN) tablet 4 mg  4 mg Oral Q6H PRN Mariea ClontsEmokpae, Courage, MD       Or  . ondansetron (ZOFRAN) injection 4 mg  4 mg Intravenous Q6H PRN Shon HaleEmokpae, Courage, MD   4 mg at 11/28/17 2011  . pantoprazole sodium (PROTONIX) 40 mg/20 mL oral suspension 40 mg  40 mg Oral Q1200 Emokpae, Courage, MD   40 mg at 11/29/17 1222  . RESOURCE THICKENUP CLEAR   Oral PRN Emokpae, Courage, MD      . sodium chloride flush (NS) 0.9 % injection 3 mL  3 mL Intravenous Q12H Haydee SalterHobbs, Phillip M, MD   3 mL at 11/28/17 1132  . vancomycin (VANCOCIN) 1,250 mg in sodium chloride 0.9 % 250 mL IVPB  1,250 mg Intravenous Q24H Aleda GranaZeigler, Dustin G, RPH   Stopped at 11/28/17 1500     Discharge Medications: Please see discharge summary for a list of discharge medications.  Relevant Imaging Results:  Relevant Lab Results:   Additional Information SS# [id number]  Nelwyn SalisburyMeghan R Purvis Sidle, LCSW
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
## 2017-11-29 NOTE — Progress Notes (Signed)
  Speech Language Pathology Treatment: [name] Speaking valve  Patient Details Name: Grace [name] MRN: [id number] DOB: 01-19-1956 Today's Date: 11/29/2017 Time: [id number] SLP Time Calculation (min) (ACUTE ONLY): 24 min  Assessment / Plan / Recommendation Clinical Impression  Grace [name] is making great progress with pmsv tolerance.  She was able to expectorate viscous secretions x1 during today's session.  Much fewer secretions noted without congested breathing. Using teach back, pt able to state need to have valve removed for breathing treatment and sleep.  All vitals stable and no breath stacking noted during session.   Recommend pt continue PMSV use throughout the day with intermittent supervision - assuring pt has call bell within reach as she can not remove PMSV herself with her left hemiplegia.   Pt is able to expectorate secretions thus please also have oral suction catheter within reach to allow her adequate clearance.    HPI HPI: Grace [name] 62 year old female with past medical history of CVA with left hemiparesis, hypertension and CAD who presented to the emergency department with fevers and elevated blood pressure.  She was admitted on 3/24 after being found encephalopathic and septic due to UTI.  On 3/25 she had acute changes on mental status and PCCM was consulted. Patient was intubated for airway protection and worsening HCRF, patient self extubated on 3/26, failed with continued hypoxia and was remains intubated due to ongoing AMS.  Patient underwent trach 3/29.  Patient completed treatment for UTI per MD notes.  Patient developed possible ileus due to poor mobility.  Patient now on trach support with vent at night.  PMSV ordered.        SLP Plan  Continue with current plan of care       Recommendations  Diet recommendations: Dysphagia 1 (puree);Nectar-thick liquid Liquids provided via: Straw Medication Administration: Whole meds with puree Supervision: Full  supervision/cueing for compensatory strategies(pt may provide herself liquids, needs assist with meals due to impulsvity) Compensations: Minimize environmental distractions;Slow rate;Small sips/bites;Other (Comment)(pmsv in place for all po) Postural Changes and/or Swallow Maneuvers: Seated upright 90 degrees;Upright 30-60 min after meal      Patient may use Passy-Muir Speech Valve: During all therapies with supervision;During PO intake/meals;During all waking hours (remove during sleep) PMSV Supervision: Intermittent         Oral Care Recommendations: Oral care BID;Other (Comment)(pt to cough/expectorate after meals) Follow up Recommendations: Skilled Nursing facility SLP Visit Diagnosis: Aphonia (R49.1) Plan: Continue with current plan of care       GO                [name], Zyire Eidson [name] 11/29/2017, 12:19 PM Donavan Burnetamara Bogdan Vivona, MS [facility] SLP [phone]

## 2017-11-29 NOTE — Progress Notes (Signed)
Patient Demographics:    Grace [name], is a 62 y.o. female, DOB - 04/25/1956, ZOX:[id number]  Admit date - 11/07/2017   Admitting Physician Haydee Salter, MD  Outpatient Primary MD for the patient is Durenda Hurt, Flossie Buffy, MD  LOS - 22   Chief Complaint  Patient presents with  . Abdominal Pain  . Fever        Subjective:    Grace [name] today has no fevers,  Tolerating pureed solids and nectar thickened liquids  Assessment  & Plan :    Principal Problem:   Acute encephalopathy Active Problems:   Sepsis (HCC)   Acute respiratory failure with hypoxemia (HCC)   Hypertensive emergency   Hypoxia   Tracheostomy status (HCC)   Brief Narrative:  Grace Bordeaux79 year old female with past medical history of CVA with left hemiparesis, hypertension and CAD who presented to the emergency department with fevers and elevated blood pressure. She was admitted on 3/24 after being found encephalopathic and septic due to UTI. On 3/25 she had acute changes on mental status and PCCM was consulted. Patient was intubated for airway protection and worsening HCRF, patient self extubated on 3/26, failed with continued hypoxia and was re-intubated due to ongoing AMS. Patient underwent trach 3/29, ventilator was weaned slowly. Patient blood pressure has improved. Patient completed treatment for UTI. Patient developed possible ileus due to poor mobility, which now has resolved. Patient now on trach collar. Nutrition via NGT. SLP working on [employer] and voice. Due to continued fevers, repeat cultures were obtained and with concern for aspiration (patient had removed her NGT numerous times), empiric unasyn was started. on 11/26/17- she did well with modified barium swallow, speech pathologist recommends oral feeding.  Treated with IV Vanco started on 11/24/2017 for a total of 7 days from first negative culture  pending final ID and sensitivity on blood cultures from 11/25/2017, if  repeat blood cultures from 11/27/2017 is still positive consider TEE (d/w Dr Luciana Axe- ID on 11/28/17-he concurs with this plan), patient lost IV on 11/29/2017, discussed with Dr., from ID service, will switch to p.o. Zyvox on 11/29/17 to complete 7 days of anti- staph treatment    Plan:- 1)Acute metabolic encephalopathy-  felt to be multifactorial from hypertensive encephalopathy (PRES), infectious process and hypercarbia. --Cognitively much improved,  follow commands, patient is able to feed herself to some extent now  2)Acute respiratory failure with hypoxia and hypercarbia in setting of acute encephalopathy Status post trach/vent dependent - slow weaned off the vent, trach has ben changed to #6cuffless 11/22/17, tolerated well, Pt remains on 28% ATC.  #6 cuffless trach. . Continue supportive O2 supplement. Speech workingon PMSV and swallow eval.patient with very slow recovery, patient's husband agreeable to PEG tube  placement if needed. On 11/26/17 she did well with modified barium swallow, speech pathologist recommends oral feeding pureed solids and nectar thickened liquids,  Appears to be tolerating oral feeding well at this time   3)Sepsis E. coli UTI - resolved- Completed treatment, initially treated with vancomycin and Zosyn subsequently changed to Rocephin x 5 days last day of treatment was 11/16/17  4) methicillin-resistant coagulase-negative staph bacteremia-  fever has resolved,   Repeat blood culture from 11/23/17 with methicillin-resistant coagulase-negative staph, repeat blood cultures from  11/25/2017 also with gram-positive cocci possibly same organism from 11/23/2017 Repeat CXR without consolidation.   Treated with IV Vanco started on 11/24/2017 for a total of 7 days from first negative culture pending final ID and sensitivity on blood cultures from 11/25/2017, if  repeat blood cultures from 11/27/2017 is still positive consider  TEE (d/w Dr Luciana Axe- ID on 11/28/17-he concurs with this plan), patient lost IV on 11/29/2017, discussed with Dr., from ID service, will switch to p.o. Zyvox on 11/29/17 to complete 7 days of anti- staph treatment  5)Hypertensive Urgency/Elevated troponin - stable, continue amlodipine 10 mg daily, hydralazine 100 mg every 8 hours, clonidine 0.2 mg 3 times daily, labetalol 200 mg 3 times daily, okay to use additional IV hydralazine or IV labetalol for elevated BP  6)HLD- Continue lipitor   7)Oral candidiasis and vulvovaginal candidiasis- c/n  Diflucan for a total of 7 doses  8)Anemia of chronic disease Hemoglobin stable. No signs of overt bleeding  9)Diabetes mellitus type II A1c 6.7 Lantus 25 units subcu daily,, Novolog adjusted per DM coordinator recommendations   10)HFpEF-with history of chronic diastolic CHF , last known EF 55-60%, Seems to be compensated at this time Monitor for signs of fluid overload  11)Severe deconditioning- SNF rehab  on discharge   12)FEN- On 11/26/17 she did well with modified barium swallow, speech pathologist recommends oral feeding pureed solids and nectar thickened liquids,  Appears to be tolerating oral feeding well at this time  patient's husband Grace [name] is agreeable to PEG tube placement if needed.    DVT prophylaxis: Lovenox Code Status: Full Family Communication:  spoke with husband Grace [name] by phone Disposition Plan:    SNF placement as long as tolerating  oral intake and afebrile   Consultants:   PCCM   ID phone consult  Procedures:   ETT 3/25>> 3/29  Trach 3/29 MBS - 11/26/17  DVT Prophylaxis  :  Lovenox   Lab Results  Component Value Date   PLT 622 (H) 11/28/2017   Inpatient Medications  Scheduled Meds: . amLODipine  10 mg Oral Daily  . atorvastatin  30 mg Oral QHS  . cloNIDine  0.2 mg Oral TID  . clopidogrel  75 mg Oral Daily  . enoxaparin (LOVENOX) injection  40 mg Subcutaneous Q24H  . hydrALAZINE  100 mg Oral Q8H    . insulin aspart  0-15 Units Subcutaneous Q4H  . insulin glargine  25 Units Subcutaneous Daily  . labetalol  200 mg Oral TID  . linezolid  600 mg Oral Q12H  . mouth rinse  15 mL Mouth Rinse QID  . metoCLOPramide (REGLAN) injection  5 mg Intravenous Q8H  . pantoprazole sodium  40 mg Oral Q1200  . sodium chloride flush  3 mL Intravenous Q12H   Continuous Infusions: . sodium chloride Stopped (11/28/17 0931)   PRN Meds:.sodium chloride, acetaminophen (TYLENOL) oral liquid 160 mg/5 mL **OR** acetaminophen, hydrALAZINE, iopamidol, labetalol, LORazepam, nitroGLYCERIN, ondansetron **OR** ondansetron (ZOFRAN) IV, RESOURCE THICKENUP CLEAR    Anti-infectives (From admission, onward)   Start     Dose/Rate Route Frequency Ordered Stop   11/29/17 1530  linezolid (ZYVOX) 100 MG/5ML suspension 600 mg     600 mg Oral Every 12 hours 11/29/17 1526     11/25/17 1000  vancomycin (VANCOCIN) 1,250 mg in sodium chloride 0.9 % 250 mL IVPB  Status:  Discontinued     1,250 mg 166.7 mL/hr over 90 Minutes Intravenous Every 24 hours 11/24/17 1449 11/29/17 1534   11/24/17 1600  vancomycin (VANCOCIN) 1,250 mg in sodium chloride 0.9 % 250 mL IVPB     1,250 mg 166.7 mL/hr over 90 Minutes Intravenous  Once 11/24/17 1449 11/24/17 1734   11/23/17 1800  Ampicillin-Sulbactam (UNASYN) 3 g in sodium chloride 0.9 % 100 mL IVPB  Status:  Discontinued     3 g 200 mL/hr over 30 Minutes Intravenous Every 6 hours 11/23/17 1549 11/24/17 1437   11/23/17 1100  fluconazole (DIFLUCAN) IVPB 200 mg     200 mg 100 mL/hr over 60 Minutes Intravenous Every 24 hours 11/23/17 0926 11/29/17 1100   11/15/17 2245  fluconazole (DIFLUCAN) 40 MG/ML suspension 200 mg     200 mg Per Tube Daily 11/15/17 2243 11/17/17 0931   11/10/17 1400  cefTRIAXone (ROCEPHIN) 2 g in sodium chloride 0.9 % 100 mL IVPB  Status:  Discontinued     2 g 200 mL/hr over 30 Minutes Intravenous Every 24 hours 11/10/17 1002 11/15/17 0849   11/08/17 1000  vancomycin  (VANCOCIN) IVPB 1000 mg/200 mL premix  Status:  Discontinued     1,000 mg 200 mL/hr over 60 Minutes Intravenous Every 24 hours 11/08/17 0409 11/10/17 0958   11/08/17 0400  piperacillin-tazobactam (ZOSYN) IVPB 3.375 g  Status:  Discontinued     3.375 g 12.5 mL/hr over 240 Minutes Intravenous Every 8 hours 11/07/17 2325 11/10/17 1001   11/07/17 1845  piperacillin-tazobactam (ZOSYN) IVPB 3.375 g     3.375 g 100 mL/hr over 30 Minutes Intravenous STAT 11/07/17 1835 11/07/17 2131   11/07/17 1830  vancomycin (VANCOCIN) IVPB 1000 mg/200 mL premix     1,000 mg 200 mL/hr over 60 Minutes Intravenous  Once 11/07/17 1828 11/07/17 2345   11/07/17 1830  piperacillin-tazobactam (ZOSYN) IVPB 2.25 g  Status:  Discontinued     2.25 g 100 mL/hr over 30 Minutes Intravenous Every 6 hours 11/07/17 1828 11/07/17 1835        Objective:   Vitals:   11/29/17 0607 11/29/17 0608 11/29/17 1215 11/29/17 1352  BP: 138/65   125/67  Pulse: 82 79  94  Resp: 18   16  Temp: 98.6 F (37 C)   99.1 F (37.3 C)  TempSrc: Oral   Oral  SpO2:  98% 99% 98%  Weight:      Height:        Wt Readings from Last 3 Encounters:  11/27/17 86.1 kg (189 lb 13.1 oz)     Intake/Output Summary (Last 24 hours) at 11/29/2017 1537 Last data filed at 11/29/2017 0800 Gross per 24 hour  Intake 890 ml  Output 600 ml  Net 290 ml    Physical Exam  Gen:- Awake Alert, to follow commands HEENT:- NCAT, pupils are reactive Neck- #6 cuffless trach with trach collar  lungs-scattered rhonchi bilaterally and movement is fairly symmetrical  CV- S1, S2 normal Abd-  +ve B.Sounds, Abd Soft, No tenderness,    Extremity/Skin:-Skin is warm and dry  psych-affect is appropriate, able to follow commands  neuro-left hemiplegia   Data Review:   Micro Results Recent Results (from the past 240 hour(s))  Culture, blood (Routine X 2) w Reflex to ID Panel     Status: None   Collection Time: 11/23/17 10:33 AM  Result Value Ref Range Status    Specimen Description   Final    BLOOD RIGHT HAND Performed at [facility], [address], [place], [place] [address]    Special Requests   Final    BOTTLES DRAWN AEROBIC  AND ANAEROBIC Blood Culture adequate volume Performed at [facility], [address] [place], [place] [address]    Culture   Final    NO GROWTH 5 DAYS Performed at [facility], [address], [place], [place] [address]    Report Status 11/28/2017 FINAL  Final  Urine Culture     Status: None   Collection Time: 11/23/17 10:41 AM  Result Value Ref Range Status   Specimen Description   Final    URINE, CATHETERIZED Performed at [facility], [address] [place], [place] [address]    Special Requests   Final    NONE Performed at [facility], [address] [place], [place] [address]    Culture   Final    NO GROWTH Performed at [facility], [address], [place], [place] [address]    Report Status 11/24/2017 FINAL  Final  Culture, blood (Routine X 2) w Reflex to ID Panel     Status: Abnormal   Collection Time: 11/23/17 11:11 AM  Result Value Ref Range Status   Specimen Description   Final    BLOOD RIGHT HAND Performed at [facility], [address], [place], [place] [address]    Special Requests   Final    BOTTLES DRAWN AEROBIC AND ANAEROBIC Blood Culture adequate volume Performed at [facility], [address], [place], [place] [address]    Culture  Setup Time   Final    GRAM POSITIVE COCCI IN BOTH AEROBIC AND ANAEROBIC BOTTLES CRITICAL RESULT CALLED TO, READ BACK BY AND VERIFIED WITH: Haze Boyden PharmD 14:25 11/24/17 ([email or web address]) Performed at [facility], [address], [place], [place] [address]    Culture STAPHYLOCOCCUS SPECIES (COAGULASE NEGATIVE) (A)  Final   Report Status 11/27/2017 FINAL  Final   Organism ID, Bacteria STAPHYLOCOCCUS SPECIES (COAGULASE  NEGATIVE)  Final      Susceptibility   Staphylococcus species (coagulase negative) - MIC*    CIPROFLOXACIN 1 SENSITIVE Sensitive     ERYTHROMYCIN >=8 RESISTANT Resistant     GENTAMICIN <=0.5 SENSITIVE Sensitive     OXACILLIN SENSITIVE Sensitive     TETRACYCLINE <=1 SENSITIVE Sensitive     VANCOMYCIN <=0.5 SENSITIVE Sensitive     TRIMETH/SULFA <=10 SENSITIVE Sensitive     CLINDAMYCIN INTERMEDIATE Intermediate     RIFAMPIN <=0.5 SENSITIVE Sensitive     Inducible Clindamycin NEGATIVE Sensitive     * STAPHYLOCOCCUS SPECIES (COAGULASE NEGATIVE)  Blood Culture ID Panel (Reflexed)     Status: Abnormal   Collection Time: 11/23/17 11:11 AM  Result Value Ref Range Status   Enterococcus species NOT DETECTED NOT DETECTED Final   Listeria monocytogenes NOT DETECTED NOT DETECTED Final   Staphylococcus species DETECTED (A) NOT DETECTED Final    Comment: Methicillin (oxacillin) resistant coagulase negative staphylococcus. Possible blood culture contaminant (unless isolated from more than one blood culture draw or clinical case suggests pathogenicity). No antibiotic treatment is indicated for blood  culture contaminants. CRITICAL RESULT CALLED TO, READ BACK BY AND VERIFIED WITH: Haze Boyden PharmD 14:25 11/24/17 ([email or web address])    Staphylococcus aureus NOT DETECTED NOT DETECTED Final   Methicillin resistance DETECTED (A) NOT DETECTED Final    Comment: CRITICAL RESULT CALLED TO, READ BACK BY AND VERIFIED WITH: Haze Boyden PharmD 14:25 11/24/17 ([email or web address])    Streptococcus species NOT DETECTED NOT DETECTED Final   Streptococcus agalactiae NOT DETECTED NOT DETECTED Final   Streptococcus pneumoniae  NOT DETECTED NOT DETECTED Final   Streptococcus pyogenes NOT DETECTED NOT DETECTED Final   Acinetobacter baumannii NOT DETECTED NOT DETECTED Final   Enterobacteriaceae species NOT DETECTED NOT DETECTED Final   Enterobacter cloacae complex NOT DETECTED NOT DETECTED Final   Escherichia coli NOT DETECTED NOT DETECTED  Final   Klebsiella oxytoca NOT DETECTED NOT DETECTED Final   Klebsiella pneumoniae NOT DETECTED NOT DETECTED Final   Proteus species NOT DETECTED NOT DETECTED Final   Serratia marcescens NOT DETECTED NOT DETECTED Final   Haemophilus influenzae NOT DETECTED NOT DETECTED Final   Neisseria meningitidis NOT DETECTED NOT DETECTED Final   Pseudomonas aeruginosa NOT DETECTED NOT DETECTED Final   Candida albicans NOT DETECTED NOT DETECTED Final   Candida glabrata NOT DETECTED NOT DETECTED Final   Candida krusei NOT DETECTED NOT DETECTED Final   Candida parapsilosis NOT DETECTED NOT DETECTED Final   Candida tropicalis NOT DETECTED NOT DETECTED Final    Comment: Performed at [facility], [address] [place], [place] [address]  Culture, blood (routine x 2)     Status: None (Preliminary result)   Collection Time: 11/25/17  5:25 AM  Result Value Ref Range Status   Specimen Description   Final    BLOOD RIGHT HAND Performed at [facility], [address], [place], [place] [address]    Special Requests   Final    BOTTLES DRAWN AEROBIC ONLY Blood Culture adequate volume Performed at [facility], [address] [place], [place] [address]    Culture   Final    NO GROWTH 4 DAYS Performed at [facility], [address], [place], [place] [address]    Report Status PENDING  Incomplete  Culture, blood (routine x 2)     Status: Abnormal   Collection Time: 11/25/17  5:38 AM  Result Value Ref Range Status   Specimen Description   Final    BLOOD LEFT HAND Performed at [facility], [address], [place], [place] [address]    Special Requests   Final    BOTTLES DRAWN AEROBIC AND ANAEROBIC Blood Culture adequate volume   Culture  Setup Time   Final    GRAM POSITIVE COCCI IN BOTH AEROBIC AND ANAEROBIC BOTTLES CRITICAL RESULT CALLED TO, READ BACK BY AND VERIFIED WITH: M RENZ PHARM D 11/26/17 AT 900 BY CM    Culture (A)  Final     STAPHYLOCOCCUS SPECIES (COAGULASE NEGATIVE) SUSCEPTIBILITIES PERFORMED ON PREVIOUS CULTURE WITHIN THE LAST 5 DAYS. Performed at [facility], [address] [place], [place] [address]    Report Status 11/28/2017 FINAL  Final  Culture, blood (Routine X 2) w Reflex to ID Panel     Status: None (Preliminary result)   Collection Time: 11/27/17  6:40 PM  Result Value Ref Range Status   Specimen Description BLOOD LEFT HAND  Final   Special Requests   Final    BOTTLES DRAWN AEROBIC ONLY Blood Culture adequate volume   Culture   Final    NO GROWTH 2 DAYS Performed at [facility], [address], [place], [place] [address]    Report Status PENDING  Incomplete  Culture, blood (Routine X 2) w Reflex to ID Panel     Status: None (Preliminary result)   Collection Time: 11/27/17  6:40 PM  Result Value Ref Range Status   Specimen Description BLOOD LEFT FOREARM  Final   Special Requests   Final  BOTTLES DRAWN AEROBIC ONLY Blood Culture results may not be optimal due to an inadequate volume of blood received in culture bottles   Culture   Final    NO GROWTH 2 DAYS Performed at [facility], [address], [place], [place] [address]    Report Status PENDING  Incomplete    Radiology Reports Ct Abdomen Pelvis Wo Contrast  Result Date: 11/07/2017 CLINICAL DATA:  Abdominal pain EXAM: CT ABDOMEN AND PELVIS WITHOUT CONTRAST TECHNIQUE: Multidetector CT imaging of the abdomen and pelvis was performed following the standard protocol without IV contrast. The examination was ordered as a CT angiography study, but there was infiltration during the contrast injection. COMPARISON:  None. FINDINGS: Lower chest: Coronary artery calcifications.  No pleural effusion. Hepatobiliary: Normal hepatic contours and density. No intra- or extrahepatic biliary dilatation. Cholelithiasis without acute inflammation. Pancreas: Normal parenchymal contours without ductal dilatation. No peripancreatic fluid  collection. Spleen: Small spleen. Adrenals/Urinary Tract: --Adrenal glands: Normal. --Right kidney/ureter: Suspected excretion of contrast. Otherwise normal kidneys. --Left kidney/ureter: Suspected excretion of contrast, otherwise normal kidneys. --Urinary bladder: Small amount of excreted contrast in the urinary bladder. Stomach/Bowel: --Stomach/Duodenum: No hiatal hernia or other gastric abnormality. Normal duodenal course. --Small bowel: No dilatation or inflammation. --Colon: No focal abnormality. --Appendix: Normal. Vascular/Lymphatic: Atherosclerotic calcification is present within the non-aneurysmal abdominal aorta, without hemodynamically significant stenosis. No abdominal or pelvic lymphadenopathy. Reproductive: Status post hysterectomy. No adnexal mass. Musculoskeletal. No bony spinal canal stenosis or focal osseous abnormality. Other: None. IMPRESSION: 1. No acute abnormality of the abdomen or pelvis. 2. Aortic Atherosclerosis (ICD10-I70.0). Coronary artery atherosclerosis. Electronically Signed   By: Deatra Robinson M.D.   On: 11/07/2017 22:41   Dg Chest 1 View  Result Date: 11/09/2017 CLINICAL DATA:  Endotracheal tube placement EXAM: CHEST  1 VIEW COMPARISON:  11/09/2017 FINDINGS: Endotracheal tube remains in good position. Gastric tube in the stomach Left lower lobe consolidation unchanged from earlier today. Right lung clear. Mild hypoventilation with decreased lung volume IMPRESSION: Endotracheal tube in good position. Left lower lobe atelectasis/infiltrate unchanged. Electronically Signed   By: Marlan Palau M.D.   On: 11/09/2017 19:03   Dg Chest 2 View  Result Date: 11/07/2017 CLINICAL DATA:  Abdominal pain and shortness of Breath EXAM: CHEST - 2 VIEW COMPARISON:  None. FINDINGS: Cardiac shadow is mildly enlarged. The lungs are well aerated bilaterally. No sizable effusion is seen. Downward displacement of the left humeral head is noted. This is likely related to some chronic subluxation.  Clinical correlation is recommended. IMPRESSION: No acute abnormality in the chest. Downward displacement of the left humeral head likely related to subluxation. Correlate with the physical exam. Electronically Signed   By: Alcide Clever M.D.   On: 11/07/2017 19:08   Dg Abd 1 View  Result Date: 11/24/2017 CLINICAL DATA:  NG tube placement EXAM: ABDOMEN - 1 VIEW COMPARISON:  11/19/2017 FINDINGS: Esophageal tube tip overlies the gastric body, side-port overlies the proximal stomach. Slight increased bowel gas within the abdomen without definitive obstructive pattern. IMPRESSION: Esophageal tube tip overlies the gastric body region. Electronically Signed   By: Jasmine Pang M.D.   On: 11/24/2017 19:29   Dg Abd 1 View  Result Date: 11/19/2017 CLINICAL DATA:  62 y/o  F; NG tube placement. EXAM: ABDOMEN - 1 VIEW COMPARISON:  11/19/2017 abdomen radiograph. FINDINGS: Normal bowel gas pattern. Enteric tube tip projects over gastric body. Mild levocurvature of the thoracolumbar junction. Bones are otherwise unremarkable. Surgical clips project over left hemiabdomen IMPRESSION: Enteric  tube tip projects over gastric body. Electronically Signed   By: Mitzi HansenLance  Furusawa-Stratton M.D.   On: 11/19/2017 22:22   Dg Abd 1 View  Result Date: 11/19/2017 CLINICAL DATA:  Patient pulled NG tube, NG tube placement EXAM: ABDOMEN - 1 VIEW COMPARISON:  11/19/2017 at 1312 hours FINDINGS: Nasogastric tube passes below the diaphragm to lie within the mid stomach. Multiple gallstones and right upper quadrant. Bowel gas pattern is unremarkable. IMPRESSION: Well-positioned nasogastric tube. Electronically Signed   By: Amie Portlandavid  Ormond M.D.   On: 11/19/2017 16:47   Dg Abd 1 View  Result Date: 11/19/2017 CLINICAL DATA:  Check nasogastric catheter placement EXAM: ABDOMEN - 1 VIEW COMPARISON:  11/19/2017 FINDINGS: Nasogastric catheter is again coiled upon itself but appears to be at least partially across the gastroesophageal junction. This  should be advanced several cm. Multiple gallstones are again seen. Scattered nonobstructive bowel gas pattern is noted. IMPRESSION: Nasogastric catheter folded upon itself distally but appears to be at least partially in the stomach. This should be advanced several cm. Electronically Signed   By: Alcide CleverMark  Lukens M.D.   On: 11/19/2017 14:05   Dg Abd 1 View  Result Date: 11/19/2017 CLINICAL DATA:  Evaluate nasogastric tube placement. EXAM: ABDOMEN - 1 VIEW COMPARISON:  11/18/2017.  CT 11/07/2017 FINDINGS: The nasogastric tube appears to be coiled up near the GE junction. The tube has been pulled back since the previous examination. Nonobstructive bowel gas pattern with gas in the transverse colon. IMPRESSION: Nasogastric tube has been pulled back compared to the previous examination. The tube appears to be coiled up near the GE junction. Electronically Signed   By: Richarda OverlieAdam  Henn M.D.   On: 11/19/2017 10:22   Dg Abd 1 View  Result Date: 11/18/2017 CLINICAL DATA:  NG tube placement EXAM: ABDOMEN - 1 VIEW COMPARISON:  11/18/2017 FINDINGS: Esophageal tube tip overlies the expected location of gastric body. Right upper quadrant calcifications corresponding to gallstones. Visible upper gas pattern nonobstructed. IMPRESSION: Esophageal tube tip overlies expected location of gastric body. Electronically Signed   By: Jasmine PangKim  Fujinaga M.D.   On: 11/18/2017 20:56   Dg Abd 1 View  Result Date: 11/18/2017 CLINICAL DATA:  Encounter for nasogastric tube placement. EXAM: ABDOMEN - 1 VIEW COMPARISON:  Radiograph of November 18, 2017. FINDINGS: The bowel gas pattern is normal. Distal tip of nasogastric tube is seen in expected position of proximal stomach. Surgical clips are noted in the abdomen. Cholelithiasis is noted. IMPRESSION: Distal tip of nasogastric tube seen in expected position of proximal stomach. No evidence of bowel obstruction or ileus. Electronically Signed   By: Lupita RaiderJames  Green Jr, M.D.   On: 11/18/2017 12:19   Dg Abd 1  View  Result Date: 11/18/2017 CLINICAL DATA:  Nasogastric tube placement EXAM: ABDOMEN - 1 VIEW COMPARISON:  Portable exam 1013 hours compared to 11/17/2017 FINDINGS: Tip of nasogastric tube projects over the distal esophagus, recommend advancing tube 12 cm into stomach. Bowel gas pattern normal. Calcified gallstones in  RIGHT upper quadrant. LEFT mid abdomen surgical clips. No acute osseous findings. IMPRESSION: Tip of nasogastric tube is at the distal esophagus; recommend advancing tube 12 cm. Cholelithiasis. Electronically Signed   By: Ulyses SouthwardMark  Boles M.D.   On: 11/18/2017 10:53   Dg Abd 1 View  Result Date: 11/17/2017 CLINICAL DATA:  Nasogastric tube placement. EXAM: ABDOMEN - 1 VIEW COMPARISON:  Radiograph of November 16, 2017. FINDINGS: The bowel gas pattern is normal. Distal tip of nasogastric tube is seen in expected position  of distal stomach. IMPRESSION: No evidence of bowel obstruction or ileus. Distal tip of nasogastric tube seen in expected position of distal stomach. Electronically Signed   By: Lupita Raider, M.D.   On: 11/17/2017 19:01   Dg Abd 1 View  Result Date: 11/14/2017 CLINICAL DATA:  Tracheostomy patient.  Nasogastric tube placement. EXAM: ABDOMEN - 1 VIEW COMPARISON:  Two days ago FINDINGS: Nasogastric tube tip overlaps the mid stomach. Diffuse bowel gas without obstructive pattern. No concerning mass effect or gas collection. Mild left base atelectasis. IMPRESSION: Nasogastric tube tip over the mid stomach. Electronically Signed   By: Marnee Spring M.D.   On: 11/14/2017 14:39   Dg Abd 1 View  Result Date: 11/12/2017 CLINICAL DATA:  NG tube placement. EXAM: ABDOMEN - 1 VIEW COMPARISON:  11/09/2017 FINDINGS: An enteric tube terminates over the distal stomach, slightly more distal than on the prior study. Surgical clips are present in the mid abdomen. There is mild gaseous distension of predominantly large bowel loops in the included portion of the abdomen. No dilated loops of bowel  suggestive of mechanical obstruction are identified. No intraperitoneal free air is identified on this supine study. No acute osseous abnormality is seen. IMPRESSION: Enteric tube in the distal stomach. Electronically Signed   By: Sebastian Ache M.D.   On: 11/12/2017 14:14   Dg Abd 1 View  Result Date: 11/09/2017 CLINICAL DATA:  Orogastric tube placement EXAM: ABDOMEN - 1 VIEW COMPARISON:  11/08/2017 FINDINGS: Gastric tube in the stomach with the tip in the body the stomach. Normal bowel gas pattern. Negative for bowel obstruction. IMPRESSION: Gastric tube body of stomach.  Normal bowel gas pattern. Electronically Signed   By: Marlan Palau M.D.   On: 11/09/2017 19:02   Dg Abd 1 View  Result Date: 11/08/2017 CLINICAL DATA:  Orogastric tube placement. EXAM: ABDOMEN - 1 VIEW COMPARISON:  None. FINDINGS: The orogastric tube extends well below the diaphragm. Tip projects in the distal stomach. IMPRESSION: Well-positioned orogastric tube, tip projecting in the distal stomach. Electronically Signed   By: Amie Portland M.D.   On: 11/08/2017 17:07   Ct Head Wo Contrast  Result Date: 11/08/2017 CLINICAL DATA:  62 year old female with altered mental status. EXAM: CT HEAD WITHOUT CONTRAST TECHNIQUE: Contiguous axial images were obtained from the base of the skull through the vertex without intravenous contrast. COMPARISON:  None. FINDINGS: Evaluation of this exam is limited due to motion artifact. Brain: There is mild age-related atrophy and chronic microvascular ischemic changes. There is no acute intracranial hemorrhage. No mass effect or midline shift. No extra-axial fluid collection. Vascular: Atherosclerotic calcification of the cerebral vasculature. Skull: Normal. Negative for fracture or focal lesion. Sinuses/Orbits: No acute finding. Other: None IMPRESSION: No definite acute intracranial hemorrhage on this severely motion degraded exam. Electronically Signed   By: Elgie Collard M.D.   On: 11/08/2017 02:35    Mr Brain Wo Contrast  Result Date: 11/09/2017 CLINICAL DATA:  Initial evaluation for acute encephalopathy. Evaluate for new stroke. Left-sided weakness. EXAM: MRI HEAD WITHOUT CONTRAST TECHNIQUE: Multiplanar, multiecho pulse sequences of the brain and surrounding structures were obtained without intravenous contrast. COMPARISON:  Comparison made with prior CT from 11/08/2017. FINDINGS: Brain: Generalized age-related cerebral atrophy, with more pronounced cerebellar atrophy noted. Patchy T2/FLAIR hyperintensity within the periventricular and deep white matter both cerebral hemispheres, most consistent with chronic small vessel ischemic disease, mild for age. Superimposed remote lacunar infarcts present within the right basal ganglia and left thalamus as well as  the pons. No abnormal foci of restricted diffusion to suggest acute or subacute ischemia. Gray-white matter differentiation maintained. No other is of chronic infarction. No acute or chronic intracranial hemorrhage. No mass lesion, midline shift or mass effect. No hydrocephalus. No extra-axial fluid or scar. Major dural sinuses are patent. Pituitary gland suprasellar region normal. Vascular: Major intracranial vascular flow voids are maintained. Skull and upper cervical spine: Craniocervical junction within normal limits. Upper cervical spine demonstrates mild degenerative spondylolysis without significant stenosis. Bone marrow signal intensity within normal limits. Mild dilatation of Foley noted. Scalp soft tissues within normal limits. Sinuses/Orbits: Globes orbital soft tissues within normal limits. Scattered mucosal thickening throughout the paranasal sinuses. Layering fluid seen within the nasopharynx. Patient is intubated. Small bilateral mastoid effusions noted. Inner ear structures grossly normal. Other: None. IMPRESSION: 1. No acute intracranial infarct or other abnormality identified. 2. Mild chronic small vessel ischemic disease with small  remote lacunar infarcts involving the right basal ganglia, left thalamus, and pons. Electronically Signed   By: Rise Mu M.D.   On: 11/09/2017 21:43   Dg Chest Port 1 View  Result Date: 11/24/2017 CLINICAL DATA:  Respiratory failure EXAM: PORTABLE CHEST 1 VIEW COMPARISON:  11/23/2017 FINDINGS: Cardiac shadow is mildly enlarged but stable. Tracheostomy tube and nasogastric catheter are again seen and stable. Lungs are well aerated bilaterally without focal infiltrate or sizable effusion. IMPRESSION: No acute abnormality noted. Electronically Signed   By: Alcide Clever M.D.   On: 11/24/2017 07:29   Dg Chest Port 1 View  Result Date: 11/23/2017 CLINICAL DATA:  Acute respiratory failure, tracheostomy patient, sepsis, acute encephalopathy, former smoker. EXAM: PORTABLE CHEST 1 VIEW COMPARISON:  Portable chest x-ray of November 18, 2017 FINDINGS: The lungs are well-expanded. The interstitial markings in the right lung are mildly increased overall as compared to the left. There is no significant pleural effusion and no pneumothorax. The cardiac silhouette remains enlarged. The pulmonary vascularity is mildly engorged. The tracheostomy tube tip projects between the clavicular heads. The esophagogastric tube tip in proximal port project below the GE junction. IMPRESSION: Mild overall increase in the prominence of the pulmonary interstitium on the right. This may reflect asymmetric pulmonary edema of cardiac or noncardiac cause. A diffuse interstitial pneumonia is felt less likely. There is no alveolar pneumonia on the right or left. Mild cardiomegaly with increased pulmonary vascular prominence. The support tubes are in reasonable position. Electronically Signed   By: David  [place] M.D.   On: 11/23/2017 07:16   Dg Chest Port 1 View  Result Date: 11/18/2017 CLINICAL DATA:  Possible UTI. EXAM: PORTABLE CHEST 1 VIEW COMPARISON:  11/15/2017. FINDINGS: Tracheostomy tube noted with tip over the trachea. Interval  removal of NG tube. Cardiomegaly with normal pulmonary vascularity. No focal infiltrate. No pleural effusion or pneumothorax. Degenerative change thoracic spine. IMPRESSION: 1. Interval removal of NG tube. Tracheostomy tube noted with tip over the trachea. 2. Cardiomegaly with normal pulmonary vascularity. No acute pulmonary disease noted. Electronically Signed   By: Maisie Fus  Register   On: 11/18/2017 09:41   Dg Chest Port 1 View  Result Date: 11/15/2017 CLINICAL DATA:  62 year old female admitted last week with sepsis. EXAM: PORTABLE CHEST 1 VIEW COMPARISON:  11/13/2017 and earlier. FINDINGS: Portable AP semi upright view at 0857 hours. Stable tracheostomy tube. Enteric tube courses to the left abdomen, tip not included. Mildly larger lung volumes. Decreased streaky opacity at the lung bases, residual greater on the left. No pneumothorax, pulmonary edema, pleural effusion or consolidation. Mediastinal contours  are stable and within normal limits. Negative visible bowel gas pattern. No acute osseous abnormality identified. IMPRESSION: 1.  Stable lines and tubes. 2. Mild atelectasis.  No other acute cardiopulmonary abnormality. Electronically Signed   By: Odessa Fleming M.D.   On: 11/15/2017 09:16   Dg Chest Port 1 View  Result Date: 11/13/2017 CLINICAL DATA:  Tracheostomy tube EXAM: PORTABLE CHEST 1 VIEW COMPARISON:  Yesterday FINDINGS: Tracheostomy tube in place. New nasogastric tube which reaches the stomach. There is a low volume chest with streaky opacities favoring atelectasis. No Kerley lines, effusion, or pneumothorax. Cardiopericardial enlargement. IMPRESSION: 1. Lower lung volumes with increased atelectasis. 2. Cardiomegaly. Electronically Signed   By: Marnee Spring M.D.   On: 11/13/2017 07:48   Dg Chest Port 1 View  Result Date: 11/12/2017 CLINICAL DATA:  Intermittent chest pain, recent leg fracture EXAM: PORTABLE CHEST 1 VIEW COMPARISON:  11/11/2017 FINDINGS: Interval tracheostomy tube placement in  satisfactory position. Interval removal of the nasogastric tube. Right lower lobe airspace disease which may reflect atelectasis versus pneumonia. No pleural effusion. No pneumothorax. Stable cardiomegaly. No acute osseous abnormality. Mild osteoarthritis of bilateral glenohumeral joints. IMPRESSION: Interval tracheostomy tube placement in satisfactory position. Interval removal of the nasogastric tube. Right lower lobe airspace disease which may reflect atelectasis versus pneumonia. Electronically Signed   By: Elige Ko   On: 11/12/2017 13:07   Dg Chest Port 1 View  Result Date: 11/11/2017 CLINICAL DATA:  Endotracheal tube position EXAM: PORTABLE CHEST 1 VIEW COMPARISON:  11/10/2017 FINDINGS: Endotracheal tube in good position.  Gastric tube in the stomach Bibasilar airspace disease left greater than right unchanged. No significant effusion. No pneumothorax. IMPRESSION: Bibasilar airspace disease left greater than right unchanged. Endotracheal tube in good position. Electronically Signed   By: Marlan Palau M.D.   On: 11/11/2017 07:16   Dg Chest Port 1 View  Result Date: 11/10/2017 CLINICAL DATA:  ETT position EXAM: PORTABLE CHEST 1 VIEW COMPARISON:  Chest x-rays dated 11/09/2017 and 11/08/2017. FINDINGS: Endotracheal tube is well positioned with tip approximately 3 cm above the level of the carina. Enteric tube passes below the diaphragm. Left lower lobe opacities are stable, presumed atelectasis and/or small effusion. No new lung findings. No pneumothorax seen. IMPRESSION: 1. Endotracheal tube well positioned with tip approximately 3 cm above the carina. 2. Persistent left lower lobe opacities, stable, probable atelectasis and/or small pleural effusion. No new lung findings. Electronically Signed   By: Bary Richard M.D.   On: 11/10/2017 09:16   Dg Chest Port 1 View  Result Date: 11/09/2017 CLINICAL DATA:  Hypoxia EXAM: PORTABLE CHEST 1 VIEW COMPARISON:  November 08, 2017 FINDINGS: Endotracheal tube  tip is 3.6 cm above the carina. Nasogastric tube tip and side port are below the diaphragm. No pneumothorax. There is airspace consolidation in the left base. Lungs elsewhere are clear. There is cardiomegaly with pulmonary vascularity within normal limits. No adenopathy. No evident bone lesions. IMPRESSION: Tube positions as described without pneumothorax. Patchy consolidation left base. Lungs elsewhere clear. Stable cardiomegaly. Electronically Signed   By: Bretta Bang III M.D.   On: 11/09/2017 08:02   Dg Chest Port 1 View  Result Date: 11/08/2017 CLINICAL DATA:  Status post ET tube placement. EXAM: PORTABLE CHEST 1 VIEW COMPARISON:  Portable film earlier in the day. FINDINGS: Cardiac enlargement. ET tube has been inserted, and lies 3.7 cm above carina. Nasogastric tube is coiled in the stomach. Aeration is improved with partial clearing of edema. No consolidation. IMPRESSION: Improved aeration post  ET tube placement. Electronically Signed   By: Elsie Stain M.D.   On: 11/08/2017 17:05   Dg Chest Port 1 View  Result Date: 11/08/2017 CLINICAL DATA:  62 year old female with hypoxia. EXAM: PORTABLE CHEST 1 VIEW COMPARISON:  Chest radiograph dated 11/07/2017 FINDINGS: Shallow inspiration with minimal bibasilar atelectasis. No focal consolidation, pleural effusion, or pneumothorax. There is borderline cardiomegaly with mild central vascular prominence which may represent mild vascular congestion. Chronic subluxed appearance of the left shoulder. No acute osseous pathology. IMPRESSION: Probable mild vascular congestion.  No focal consolidation. Electronically Signed   By: Elgie Collard M.D.   On: 11/08/2017 05:20   Dg Abd 2 Views  Result Date: 11/07/2017 CLINICAL DATA:  Diffuse abdominal pain EXAM: ABDOMEN - 2 VIEW COMPARISON:  None. FINDINGS: Scattered large and small bowel gas is noted. Mild retained fecal material is seen. No obstructive changes are noted. No free air is seen. No acute bony  abnormality is noted. IMPRESSION: No acute abnormality seen. Electronically Signed   By: Alcide Clever M.D.   On: 11/07/2017 19:10   Dg Abd Portable 1v  Result Date: 11/16/2017 CLINICAL DATA:  Tachycardia.  Vomiting. EXAM: PORTABLE ABDOMEN - 1 VIEW COMPARISON:  11/14/2017. FINDINGS: Gastric tube remains within the stomach. There is moderate gaseous distention of small and large bowel. Distal colonic gas appears to be present. Query LEFT lung base consolidation or atelectasis. IMPRESSION: Moderate gaseous distension appears slightly increased from priors. Query ileus. NG tube in stomach. Electronically Signed   By: Elsie Stain M.D.   On: 11/16/2017 09:25   Dg Swallowing Func-speech Pathology  Result Date: 11/26/2017 Objective Swallowing Evaluation: Type of Study: MBS-Modified Barium Swallow Study  Patient Details Name: Eily Louvier MRN: [id number] Date of Birth: 01-Nov-1955 Today's Date: 11/26/2017 Time: SLP Start Time (ACUTE ONLY): 1435 -SLP Stop Time (ACUTE ONLY): 1505 SLP Time Calculation (min) (ACUTE ONLY): 30 min Past Medical History: Past Medical History: Diagnosis Date . Hypertension  . Stroke [facility])   Left sided weakness  Past Surgical History: No past surgical history on file. HPI: Shatira Dobosz 62 year old female with past medical history of CVA with left hemiparesis, hypertension and CAD who presented to the emergency department with fevers and elevated blood pressure.  She was admitted on 3/24 after being found encephalopathic and septic due to UTI.  On 3/25 she had acute changes on mental status and PCCM was consulted. Patient was intubated for airway protection and worsening HCRF, patient self extubated on 3/26, failed with continued hypoxia and was remains intubated due to ongoing AMS.  Patient underwent trach 3/29.  Patient completed treatment for UTI per MD notes.  Patient developed possible ileus due to poor mobility.  Patient now on trach support with vent at night.  PMSV ordered.    Subjective: pt awake in chair Assessment / Plan / Recommendation CHL IP CLINICAL IMPRESSIONS 11/26/2017 Clinical Impression Patient presents with moderate oropharyngeal dysphagia without aspiration.  She did demonstrate premature spillage of barium into pharynx, piecemealing and decreased timing of laryngeal closure/swallow resulting in laryngeal penetration of thin liquids.   Pharyngeal swallow fortunately is strong without residuals!  Pt did demonstrate reflexive cough with deeper penetration of thin liquids.  Recommend avoid NG tube and allow pt to have dys1/nectar diet with full supervision and pmsv in place.  Hopefully pt may be able to avoid PEG.  Using live video, educated pt to findings/recommendations.   SLP Visit Diagnosis Dysphagia, oropharyngeal phase (R13.12) Attention and concentration deficit following -- Frontal lobe  and executive function deficit following -- Impact on safety and function Moderate aspiration risk   CHL IP TREATMENT RECOMMENDATION 11/26/2017 Treatment Recommendations Therapy as outlined in treatment plan below   Prognosis 11/26/2017 Prognosis for Safe Diet Advancement Fair Barriers to Reach Goals -- Barriers/Prognosis Comment -- CHL IP DIET RECOMMENDATION 11/26/2017 SLP Diet Recommendations Dysphagia 1 (Puree) solids;Nectar thick liquid Liquid Administration via Cup;Straw Medication Administration Whole meds with puree Compensations Slow rate;Small sips/bites;Other (Comment) Postural Changes Seated upright at 90 degrees;Remain semi-upright after after feeds/meals (Comment)   CHL IP OTHER RECOMMENDATIONS 11/26/2017 Recommended Consults -- Oral Care Recommendations Oral care QID;Oral care before and after PO Other Recommendations Have oral suction available   CHL IP FOLLOW UP RECOMMENDATIONS 11/26/2017 Follow up Recommendations Skilled Nursing facility   [facility] IP FREQUENCY AND DURATION 11/26/2017 Speech Therapy Frequency (ACUTE ONLY) min 2x/week Treatment Duration 2 weeks      CHL IP ORAL PHASE  11/26/2017 Oral Phase Impaired Oral - Pudding Teaspoon -- Oral - Pudding Cup -- Oral - Honey Teaspoon -- Oral - Honey Cup -- Oral - Nectar Teaspoon Premature spillage;Lingual/palatal residue;Piecemeal swallowing Oral - Nectar Cup Decreased bolus cohesion;Premature spillage;Delayed oral transit;Lingual/palatal residue;Piecemeal swallowing Oral - Nectar Straw Decreased bolus cohesion;Premature spillage;Delayed oral transit;Lingual/palatal residue;Piecemeal swallowing Oral - Thin Teaspoon Premature spillage;Decreased bolus cohesion;Delayed oral transit;Lingual/palatal residue;Piecemeal swallowing Oral - Thin Cup Premature spillage;Decreased bolus cohesion;Delayed oral transit;Lingual/palatal residue;Piecemeal swallowing Oral - Thin Straw Decreased bolus cohesion;Premature spillage;Lingual/palatal residue;Piecemeal swallowing Oral - Puree Premature spillage;Decreased bolus cohesion;Delayed oral transit;Lingual/palatal residue;Piecemeal swallowing Oral - Mech Soft Decreased bolus cohesion;Premature spillage;Weak lingual manipulation;Delayed oral transit;Lingual/palatal residue Oral - Regular -- Oral - Multi-Consistency -- Oral - Pill Weak lingual manipulation;Delayed oral transit;Lingual/palatal residue Oral Phase - Comment --  CHL IP PHARYNGEAL PHASE 11/26/2017 Pharyngeal Phase Impaired Pharyngeal- Pudding Teaspoon -- Pharyngeal -- Pharyngeal- Pudding Cup -- Pharyngeal -- Pharyngeal- Honey Teaspoon -- Pharyngeal -- Pharyngeal- Honey Cup -- Pharyngeal -- Pharyngeal- Nectar Teaspoon Delayed swallow initiation-vallecula Pharyngeal -- Pharyngeal- Nectar Cup Delayed swallow initiation-vallecula Pharyngeal -- Pharyngeal- Nectar Straw Delayed swallow initiation-vallecula Pharyngeal -- Pharyngeal- Thin Teaspoon Delayed swallow initiation-vallecula Pharyngeal -- Pharyngeal- Thin Cup Penetration/Aspiration during swallow;Penetration/Apiration after swallow;Delayed swallow initiation-vallecula Pharyngeal Material enters airway,  remains ABOVE vocal cords and not ejected out Pharyngeal- Thin Straw Delayed swallow initiation-vallecula;Penetration/Aspiration during swallow;Penetration/Apiration after swallow Pharyngeal Material enters airway, remains ABOVE vocal cords and not ejected out;Material enters airway, CONTACTS cords and then ejected out Pharyngeal- Puree Delayed swallow initiation-vallecula Pharyngeal -- Pharyngeal- Mechanical Soft Delayed swallow initiation-vallecula Pharyngeal -- Pharyngeal- Regular -- Pharyngeal -- Pharyngeal- Multi-consistency -- Pharyngeal -- Pharyngeal- Pill Delayed swallow initiation-vallecula Pharyngeal -- Pharyngeal Comment --  CHL IP CERVICAL ESOPHAGEAL PHASE 11/26/2017 Cervical Esophageal Phase WFL Pudding Teaspoon -- Pudding Cup -- Honey Teaspoon -- Honey Cup -- Nectar Teaspoon -- Nectar Cup -- Nectar Straw -- Thin Teaspoon -- Thin Cup -- Thin Straw -- Puree -- Mechanical Soft -- Regular -- Multi-consistency -- Pill -- Cervical Esophageal Comment -- Donavan Burnet, MS [facility] SLP [phone] 11/26/2017, 4:35 PM                CBC Recent Labs  Lab 11/23/17 0309 11/24/17 0814 11/25/17 0526 11/28/17 0455  WBC 10.9* 9.7 7.3 5.7  HGB 9.2* 9.1* 8.8* 9.7*  HCT 30.1* 29.4* 28.7* 31.4*  PLT 563* 570* 572* 622*  MCV 89.6 88.6 89.7 89.5  MCH 27.4 27.4 27.5 27.6  MCHC 30.6 31.0 30.7 30.9  RDW 14.9 14.8 14.9 14.9    Chemistries  Recent Labs  Lab 11/23/17 0309  11/24/17 0814 11/25/17 0526 11/27/17 1143 11/29/17 0531  NA 139 135 137  --   --   K 4.3 4.1 4.3  --   --   CL 102 97* 98*  --   --   CO2 27 26 30   --   --   GLUCOSE 217* 214* 139*  --   --   BUN 12 14 11   --   --   CREATININE 0.74 0.81 0.74 0.94 0.81  CALCIUM 9.3 9.1 9.3  --   --   MG 2.0  --   --   --   --    ------------------------------------------------------------------------------------------------------------------ No results for input(s): CHOL, HDL, LDLCALC, TRIG, CHOLHDL, LDLDIRECT in the last 72 hours.  Lab Results    Component Value Date   HGBA1C 6.7 (H) 11/08/2017   ------------------------------------------------------------------------------------------------------------------ No results for input(s): TSH, T4TOTAL, T3FREE, THYROIDAB in the last 72 hours.  Invalid input(s): FREET3 ------------------------------------------------------------------------------------------------------------------ No results for input(s): VITAMINB12, FOLATE, FERRITIN, TIBC, IRON, RETICCTPCT in the last 72 hours.  Coagulation profile No results for input(s): INR, PROTIME in the last 168 hours.  No results for input(s): DDIMER in the last 72 hours.  Cardiac Enzymes No results for input(s): CKMB, TROPONINI, MYOGLOBIN in the last 168 hours.  Invalid input(s): CK ------------------------------------------------------------------------------------------------------------------ No results found for: BNP   [name] M.D on 11/29/2017 at 3:37 PM  Between 7am to 7pm - Pager - [phone]  After 7pm go to [email or web address] - password [email or web address]  [facility] -  Office  [phone]   Voice Recognition Reubin Milan dictation system was used to create this note, attempts have been made to correct errors. Please contact the author with questions and/or clarifications.

## 2017-11-29 NOTE — Progress Notes (Signed)
CSW following to assist with [facility] Facility aware pt has new trach and  reports SNF able to manage of this and IV antibiotics.  Completed FL2 in anticipation of DC to SNF this week.   Ilean SkillMeghan Sophya Vanblarcom, MSW, LCSW Clinical Social Work 11/29/2017 [phone]

## 2017-11-29 NOTE — Progress Notes (Signed)
  Speech Language Pathology Treatment: Dysphagia  Patient Details Name: Grace [name] MRN: [id number] DOB: December 01, 1955 Today's Date: 11/29/2017 Time: [id number] SLP Time Calculation (min) (ACUTE ONLY): 27 min  Assessment / Plan / Recommendation Clinical Impression  Intake listed as 20% and RN reports good tolerance except for eggs this am *which were not pureed per RN.  Pt willing to accept liquids and pudding with this SLP.  She consumed approximately 14 ounces of nectar diet soda and pudding. NO indications of airway compromise with intake.  Pt did have delayed cough and expectorated viscous clear secretions = With cueing she orally suctioned herself.    Recommend continue dys1/nectar with precautions.  Advise to allow pt to have nectar liquids independently to maximize hydration. Pt will benefit from follow up at SNF for dysphagia management given delay in swallow and essentially silent nature of dysphagia.       HPI HPI: Grace LefevreGwendolyn [name] 62 year old female with past medical history of CVA with left hemiparesis, hypertension and CAD who presented to the emergency department with fevers and elevated blood pressure.  She was admitted on 3/24 after being found encephalopathic and septic due to UTI.  On 3/25 she had acute changes on mental status and PCCM was consulted. Patient was intubated for airway protection and worsening HCRF, patient self extubated on 3/26, failed with continued hypoxia and was remains intubated due to ongoing AMS.  Patient underwent trach 3/29.  Patient completed treatment for UTI per MD notes.  Patient developed possible ileus due to poor mobility.  Patient now on trach support with vent at night.  PMSV ordered.        SLP Plan  Continue with current plan of care       Recommendations  Diet recommendations: Dysphagia 1 (puree);Nectar-thick liquid Liquids provided via: Straw Medication Administration: Whole meds with puree Supervision: Full supervision/cueing  for compensatory strategies(pt may provide herself liquids, needs assist with meals due to impulsvity) Compensations: Minimize environmental distractions;Slow rate;Small sips/bites;Other (Comment)(pmsv in place for all po) Postural Changes and/or Swallow Maneuvers: Seated upright 90 degrees;Upright 30-60 min after meal                Oral Care Recommendations: Oral care BID;Other (Comment)(pt to cough/expectorate after meals) Follow up Recommendations: Home health SLP SLP Visit Diagnosis: Dysphagia, oropharyngeal phase (R13.12) Plan: Continue with current plan of care       GO               Donavan Burnetamara Dusty Raczkowski, MS [facility] S[facility] [phone]  [name], [name] 11/29/2017, 11:59 AM

## 2017-11-30 LAB — BASIC METABOLIC PANEL
Anion gap: 10 (ref 5–15)
BUN: 9 mg/dL (ref 6–20)
CALCIUM: 9.6 mg/dL (ref 8.9–10.3)
CO2: 26 mmol/L (ref 22–32)
CREATININE: 0.9 mg/dL (ref 0.44–1.00)
Chloride: 103 mmol/L (ref 101–111)
GFR calc non Af Amer: >60 mL/min (ref >60)
Glucose, Bld: 104 mg/dL — ABNORMAL HIGH (ref 65–99)
Potassium: 3.9 mmol/L (ref 3.5–5.1)
SODIUM: 139 mmol/L (ref 135–145)

## 2017-11-30 LAB — CULTURE, BLOOD (ROUTINE X 2)
CULTURE: NO GROWTH
SPECIAL REQUESTS: ADEQUATE

## 2017-11-30 LAB — GLUCOSE, CAPILLARY
GLUCOSE-CAPILLARY: 99 mg/dL (ref 65–99)
GLUCOSE-CAPILLARY: 119 mg/dL — AB (ref 65–99)
Glucose-Capillary: 179 mg/dL — ABNORMAL HIGH (ref 65–99)

## 2017-11-30 MED ORDER — LABETALOL HCL 200 MG PO TABS: 200.0000 mg | ORAL_TABLET | Freq: Three times a day (TID) | ORAL | 1 refills | Status: AC

## 2017-11-30 MED ORDER — HYDRALAZINE HCL 100 MG PO TABS: 100.0000 mg | ORAL_TABLET | Freq: Three times a day (TID) | ORAL | 1 refills | Status: AC

## 2017-11-30 MED ORDER — AMLODIPINE BESYLATE 10 MG PO TABS: 10.0000 mg | ORAL_TABLET | Freq: Every day | ORAL | 1 refills | Status: AC

## 2017-11-30 MED ORDER — INSULIN ASPART 100 UNIT/ML ~~LOC~~ SOLN: 0.0000 [IU] | Freq: Three times a day (TID) | SUBCUTANEOUS | Status: DC

## 2017-11-30 MED ORDER — ACETAMINOPHEN 160 MG/5ML PO SOLN: 650.0000 mg | Freq: Four times a day (QID) | ORAL | 0 refills | Status: DC | PRN

## 2017-11-30 MED ORDER — ONDANSETRON HCL 4 MG PO TABS: 4.0000 mg | ORAL_TABLET | Freq: Four times a day (QID) | ORAL | 0 refills | Status: DC | PRN

## 2017-11-30 MED ORDER — CLONIDINE HCL 0.2 MG PO TABS: 0.2000 mg | ORAL_TABLET | Freq: Three times a day (TID) | ORAL | 2 refills | Status: AC

## 2017-11-30 MED ORDER — INSULIN ASPART 100 UNIT/ML ~~LOC~~ SOLN: 0.0000 [IU] | Freq: Three times a day (TID) | SUBCUTANEOUS | 11 refills | Status: AC

## 2017-11-30 MED ORDER — LINEZOLID 600 MG PO TABS: 600.0000 mg | ORAL_TABLET | Freq: Two times a day (BID) | ORAL | 0 refills | Status: DC

## 2017-11-30 MED ORDER — RESOURCE THICKENUP CLEAR PO POWD: 1.0000 | ORAL | 1 refills | Status: AC | PRN

## 2017-11-30 MED ORDER — PANTOPRAZOLE SODIUM 40 MG PO PACK: 40.0000 mg | PACK | Freq: Every day | ORAL | 1 refills | Status: AC

## 2017-11-30 MED ORDER — INSULIN GLARGINE 100 UNIT/ML ~~LOC~~ SOLN: 25.0000 [IU] | Freq: Every day | SUBCUTANEOUS | 11 refills | Status: AC

## 2017-11-30 MED ORDER — ISOSORBIDE MONONITRATE ER 30 MG PO TB24: 30.0000 mg | ORAL_TABLET | Freq: Every day | ORAL | 3 refills | Status: AC

## 2017-11-30 NOTE — Discharge Summary (Signed)
Grace [name], is a 62 y.o. female  DOB Jul 23, 1956  MRN [id number].  Admission date:  11/07/2017  Admitting Physician  Haydee Salter, MD  Discharge Date:  11/30/2017   Primary MD  Justin Mend, MD  Recommendations for primary care physician for things to follow:   1)Routine Trach Care--- she has trach with trach collar, suction as needed per protocol 2)Patient is at risk for aspiration-pured solids and nectar thickened liquids advised to be fed at at least 45 degree angle 3)Speech and Swallow Reevaluation in a couple of weeks 4)Please encourage adequate fluid intake to prevent dehydration 5)Placed on patient frequently from side-to-side to prevent decubitus ulcers  Admission Diagnosis  Generalized abdominal pain [R10.84] Hypoxia [R09.02] Abdominal pain [R10.9] Sepsis (HCC) [A41.9]   Discharge Diagnosis  Generalized abdominal pain [R10.84] Hypoxia [R09.02] Abdominal pain [R10.9] Sepsis (HCC) [A41.9]    Principal Problem:   Acute encephalopathy Active Problems:   Sepsis (HCC)   Acute respiratory failure with hypoxemia (HCC)   Hypertensive emergency   Hypoxia   Tracheostomy status (HCC)      Past Medical History:  Diagnosis Date  . Hypertension   . Stroke [facility])    Left sided weakness     History reviewed. No pertinent surgical history.     HPI  from the history and physical done on the day of admission:     Chief Complaint: "My stomach hurts."  HPI: Grace [name] is a 62 y.o. female  With hx of CAD for stroke presents to the emergency room with abdominal pain.  History gathered from patient and nurse facility.  Patient with sudden onset of abdominal pain during the daytime.  Pain not better with laxative.  Patient also given her daily pain medicine.  Patient sent over to the emergency room for evaluation due to no pain relief.  ED course: CT abdomen and pelvis  negative for acute abnormalities.  Patient found to be septic and started on vancomycin and Zosyn.  Cultures taken.  Hospitalist consulted for admission.    Hospital Course:   Brief Narrative: Grace [age]deaux39 year old female with past medical history of CVA with left hemiparesis, hypertension and CAD who presented to the emergency department with fevers and elevated blood pressure. She was admitted on 3/24 after being found encephalopathic and septic due to UTI. On 3/25 she had acute changes on mental status and PCCM was consulted. Patient was intubated for airway protection and worsening HCRF, patient self extubated on 3/26, failed with continued hypoxia and was re-intubated due to ongoing AMS. Patient underwent trach 3/29, ventilator was weaned slowly. Patient blood pressure has improved. Patient completed treatment for UTI. Patient developed possible ileus due to poor mobility, which now has resolved. Patient now on trach collar. Nutrition via NGT. SLP working on [employer] and voice.Due to continued fevers, repeat cultures were obtained and with concern for aspiration (patient had removed her NGT numerous times), empiric unasyn was started.on 11/26/17- she did well with modified barium swallow, speech pathologist recommends oral feeding.  Treated with IV  Vanco started on 11/24/2017 for a total of 7 days from first negative culture pending final ID and sensitivity on blood cultures from 11/25/2017,  repeat blood cultures from 11/27/2017 is Negative so No TEE done.  (d/w Dr Luciana Axe- ID on 11/28/17-he concurs with this plan), patient lost IV on 11/29/2017, discussed with Dr.Comer, from ID service, switched  to p.o. Zyvox on 11/29/17 to complete 7 days of anti- staph treatment    Plan:- 1)Acute metabolic encephalopathy-  felt to be multifactorial from hypertensive encephalopathy (PRES), infectious process and hypercarbia. --Cognitively much improved,  follow commands, patient is able to feed herself  to some extent now, patient is talking and interacting well  2)Acute respiratory failure with hypoxia and hypercarbia in setting of acute encephalopathy Status post trach/vent dependent - slow weaned off the vent, trach has ben changed to #6cuffless 11/22/17, tolerated well, Pt remains on 28% ATC. #6 cuffless trach. . Continue supportive O2 supplement. Speech workingon PMSV and swallow eval.patient with very slow recovery, patient's husband agreeable to PEG tube placement if needed. On 11/26/17 she did well with modified barium swallow, speech pathologist recommends oral feeding pureed solids and nectar thickened liquids,  Appears to be tolerating oral feeding well at this time   3)Sepsis E. coli UTI - resolved- Completed treatment, initially treated with vancomycin and Zosyn subsequently changed to Rocephin x 5 days last day of treatment was 11/16/17  4) methicillin-resistant coagulase-negative staph bacteremia-  fever has resolved,   Repeat blood culture from 11/23/17 with methicillin-resistant coagulase-negative staph, repeat blood cultures from 11/25/2017 also with same organism from 11/23/2017,  Repeat CXR without consolidation.   reated with IV Vanco started on 11/24/2017 for a total of 7 days from first negative culture pending final ID and sensitivity on blood cultures from 11/25/2017,  repeat blood cultures from 11/27/2017 is Negative so No TEE done.  (d/w Dr Luciana Axe- ID on 11/28/17-he concurs with this plan), patient lost IV on 11/29/2017, discussed with Dr.Comer, from ID service, switched  to p.o. Zyvox on 11/29/17 to complete 7 days of anti- staph treatment  5)Hypertensive Urgency/Elevated troponin - stable, continue amlodipine 10 mg daily, hydralazine 100 mg every tid, clonidine 0.2 mg 3 times daily, labetalol 200 mg 3 times daily and imdur 30 mg daily   6)HLD- Continue lipitor  7)Oral candidiasisand vulvovaginal candidiasis-treated with Diflucan   8)Anemia of chronic disease Hemoglobin  stable. No signs of overt bleeding  9)Diabetes mellitus type II- A1c 6.7, Lantus 25 units subcu daily,, may use sliding scale insulin   10)HFpEF-with history of chronic diastolic CHF , last known EF 55-60%,-seems to be compensated at this time Monitor for signs of fluid overload  11)Severe deconditioning- SNF rehab  on discharge  12)FEN- On 11/26/17 she did well with modified barium swallow, speech pathologist recommends oral feeding pureed solids and nectar thickened liquids,  Appears to be tolerating oral feeding well at this time  patient's husband Bethann Berkshire is agreeable to PEG tube placement if needed in the near future.    DVT prophylaxis:Lovenox Code Status:Full Family Communication: spoke with husband Johhnny by phone Disposition Plan:   SNF placement as long as tolerating  oral intake and afebrile   Consultants:  PCCM  ID phone consult  Procedures:  ETT 3/25>> 3/29  Trach 3/29 MBS - 11/26/17  Discharge Condition: stable  Follow UP  Contact information for after-discharge care    Destination    [facility] .   Service:  Skilled Nursing Contact information: [address] [place]  [place] [address] [phone]               Diet and Activity recommendation:  As advised  Discharge Instructions     Discharge Instructions    Bed rest   Complete by:  As directed    Please turn patient frequently from side-to-side to prevent decubitus ulcers   Call MD for:  difficulty breathing, headache or visual disturbances   Complete by:  As directed    Call MD for:  persistant dizziness or light-headedness   Complete by:  As directed    Call MD for:  persistant nausea and vomiting   Complete by:  As directed    Call MD for:  severe uncontrolled pain   Complete by:  As directed    Call MD for:  temperature >100.4   Complete by:  As directed    Diet - low sodium heart healthy   Complete by:  As directed    Pured solids and  nectar thickened liquids to be fed at at least 45 degree angle to avoid aspiration   Discharge instructions   Complete by:  As directed    1)Routine Trach Care--- she has trach with trach collar, suction as needed per protocol 2) patient is at risk for aspiration-pured solids and nectar thickened liquids advised to be fed at at least 45 degree angle 3)Speech and Swallow Reevaluation in a couple of weeks 4)Please encourage adequate fluid intake to prevent dehydration 5) placed on patient frequently from side-to-side to prevent decubitus ulcers        Discharge Medications     Allergies as of 11/30/2017   No Known Allergies     Medication List    STOP taking these medications   diltiazem 240 MG 24 hr capsule Commonly known as:  DILACOR XR   doxazosin 2 MG tablet Commonly known as:  CARDURA   furosemide 20 MG tablet Commonly known as:  LASIX   losartan 100 MG tablet Commonly known as:  COZAAR   metFORMIN 500 MG tablet Commonly known as:  GLUCOPHAGE   metoprolol tartrate 100 MG tablet Commonly known as:  LOPRESSOR   spironolactone 25 MG tablet Commonly known as:  ALDACTONE     TAKE these medications   acetaminophen 325 MG tablet Commonly known as:  TYLENOL Take 650 mg by mouth 3 (three) times daily. May also take 650 mg q6h prn What changed:  Another medication with the same name was added. Make sure you understand how and when to take each.   acetaminophen 160 MG/5ML solution Commonly known as:  TYLENOL Take 20.3 mLs (650 mg total) by mouth every 6 (six) hours as needed for mild pain (fever >= 101). What changed:  You were already taking a medication with the same name, and this prescription was added. Make sure you understand how and when to take each.   alendronate 70 MG tablet Commonly known as:  FOSAMAX Take 70 mg by mouth every Friday. Take with a full glass of water on an empty stomach.   amLODipine 10 MG tablet Commonly known as:  NORVASC Take 1 tablet  (10 mg total) by mouth daily. Start taking on:  12/01/2017   atorvastatin 20 MG tablet Commonly known as:  LIPITOR Take 30 mg by mouth at bedtime.   calcium-vitamin D 500-200 MG-UNIT tablet Commonly known as:  OSCAL WITH D Take 1 tablet by mouth 2 (two) times daily.   cloNIDine 0.2 MG tablet Commonly known as:  CATAPRES Take  1 tablet (0.2 mg total) by mouth 3 (three) times daily. What changed:    medication strength  how much to take   clopidogrel 75 MG tablet Commonly known as:  PLAVIX Take 75 mg by mouth daily.   DULoxetine 60 MG capsule Commonly known as:  CYMBALTA Take 60 mg by mouth daily.   ferrous sulfate 325 (65 FE) MG EC tablet Take 325 mg by mouth at bedtime.   gabapentin 300 MG capsule Commonly known as:  NEURONTIN Take 300-600 mg by mouth 2 (two) times daily. 300 mg every morning, 600 mg every night   guaiFENesin 100 MG/5ML Soln Commonly known as:  ROBITUSSIN Take 15 mLs by mouth every 4 (four) hours as needed for cough or to loosen phlegm.   hydrALAZINE 100 MG tablet Commonly known as:  APRESOLINE Take 1 tablet (100 mg total) by mouth 3 (three) times daily. What changed:  when to take this   insulin aspart 100 UNIT/ML injection Commonly known as:  novoLOG Inject 0-9 Units into the skin 3 (three) times daily with meals.   insulin glargine 100 UNIT/ML injection Commonly known as:  LANTUS Inject 0.25 mLs (25 Units total) into the skin daily. Start taking on:  12/01/2017 What changed:    how much to take  when to take this   insulin lispro 100 UNIT/ML injection Commonly known as:  HUMALOG Inject 2-8 Units into the skin 2 (two) times daily. 201-250= 2 units 251-300= 4 units 301-350- 6 units 350-400= 8 units > 400 Call MD   isosorbide mononitrate 30 MG 24 hr tablet Commonly known as:  IMDUR Take 1 tablet (30 mg total) by mouth daily.   labetalol 200 MG tablet Commonly known as:  NORMODYNE Take 1 tablet (200 mg total) by mouth 3 (three)  times daily.   linezolid 600 MG tablet Commonly known as:  ZYVOX Take 1 tablet (600 mg total) by mouth every 12 (twelve) hours.   montelukast 10 MG tablet Commonly known as:  SINGULAIR Take 10 mg by mouth at bedtime.   nitroGLYCERIN 0.4 MG SL tablet Commonly known as:  NITROSTAT Place 0.4 mg under the tongue every 5 (five) minutes as needed for chest pain.   ondansetron 4 MG tablet Commonly known as:  ZOFRAN Take 1 tablet (4 mg total) by mouth every 6 (six) hours as needed for nausea.   pantoprazole sodium 40 mg/20 mL Pack Commonly known as:  PROTONIX Take 20 mLs (40 mg total) by mouth daily at 12 noon. Start taking on:  12/01/2017   polyethylene glycol packet Commonly known as:  MIRALAX / GLYCOLAX Take 17 g by mouth daily as needed for mild constipation or moderate constipation.   RESOURCE THICKENUP CLEAR Powd Take 120 g by mouth as needed.   Vitamin D (Ergocalciferol) 50000 units Caps capsule Commonly known as:  DRISDOL Take 50,000 Units by mouth every 30 (thirty) days.       Major procedures and Radiology Reports - PLEASE review detailed and final reports for all details, in brief -    Ct Abdomen Pelvis Wo Contrast  Result Date: 11/07/2017 CLINICAL DATA:  Abdominal pain EXAM: CT ABDOMEN AND PELVIS WITHOUT CONTRAST TECHNIQUE: Multidetector CT imaging of the abdomen and pelvis was performed following the standard protocol without IV contrast. The examination was ordered as a CT angiography study, but there was infiltration during the contrast injection. COMPARISON:  None. FINDINGS: Lower chest: Coronary artery calcifications.  No pleural effusion. Hepatobiliary: Normal hepatic contours and density. No  intra- or extrahepatic biliary dilatation. Cholelithiasis without acute inflammation. Pancreas: Normal parenchymal contours without ductal dilatation. No peripancreatic fluid collection. Spleen: Small spleen. Adrenals/Urinary Tract: --Adrenal glands: Normal. --Right  kidney/ureter: Suspected excretion of contrast. Otherwise normal kidneys. --Left kidney/ureter: Suspected excretion of contrast, otherwise normal kidneys. --Urinary bladder: Small amount of excreted contrast in the urinary bladder. Stomach/Bowel: --Stomach/Duodenum: No hiatal hernia or other gastric abnormality. Normal duodenal course. --Small bowel: No dilatation or inflammation. --Colon: No focal abnormality. --Appendix: Normal. Vascular/Lymphatic: Atherosclerotic calcification is present within the non-aneurysmal abdominal aorta, without hemodynamically significant stenosis. No abdominal or pelvic lymphadenopathy. Reproductive: Status post hysterectomy. No adnexal mass. Musculoskeletal. No bony spinal canal stenosis or focal osseous abnormality. Other: None. IMPRESSION: 1. No acute abnormality of the abdomen or pelvis. 2. Aortic Atherosclerosis (ICD10-I70.0). Coronary artery atherosclerosis. Electronically Signed   By: Deatra Robinson M.D.   On: 11/07/2017 22:41   Dg Chest 1 View  Result Date: 11/09/2017 CLINICAL DATA:  Endotracheal tube placement EXAM: CHEST  1 VIEW COMPARISON:  11/09/2017 FINDINGS: Endotracheal tube remains in good position. Gastric tube in the stomach Left lower lobe consolidation unchanged from earlier today. Right lung clear. Mild hypoventilation with decreased lung volume IMPRESSION: Endotracheal tube in good position. Left lower lobe atelectasis/infiltrate unchanged. Electronically Signed   By: Marlan Palau M.D.   On: 11/09/2017 19:03   Dg Chest 2 View  Result Date: 11/07/2017 CLINICAL DATA:  Abdominal pain and shortness of Breath EXAM: CHEST - 2 VIEW COMPARISON:  None. FINDINGS: Cardiac shadow is mildly enlarged. The lungs are well aerated bilaterally. No sizable effusion is seen. Downward displacement of the left humeral head is noted. This is likely related to some chronic subluxation. Clinical correlation is recommended. IMPRESSION: No acute abnormality in the chest. Downward  displacement of the left humeral head likely related to subluxation. Correlate with the physical exam. Electronically Signed   By: Alcide Clever M.D.   On: 11/07/2017 19:08   Dg Abd 1 View  Result Date: 11/24/2017 CLINICAL DATA:  NG tube placement EXAM: ABDOMEN - 1 VIEW COMPARISON:  11/19/2017 FINDINGS: Esophageal tube tip overlies the gastric body, side-port overlies the proximal stomach. Slight increased bowel gas within the abdomen without definitive obstructive pattern. IMPRESSION: Esophageal tube tip overlies the gastric body region. Electronically Signed   By: Jasmine Pang M.D.   On: 11/24/2017 19:29   Dg Abd 1 View  Result Date: 11/19/2017 CLINICAL DATA:  62 y/o  F; NG tube placement. EXAM: ABDOMEN - 1 VIEW COMPARISON:  11/19/2017 abdomen radiograph. FINDINGS: Normal bowel gas pattern. Enteric tube tip projects over gastric body. Mild levocurvature of the thoracolumbar junction. Bones are otherwise unremarkable. Surgical clips project over left hemiabdomen IMPRESSION: Enteric tube tip projects over gastric body. Electronically Signed   By: Mitzi Hansen M.D.   On: 11/19/2017 22:22   Dg Abd 1 View  Result Date: 11/19/2017 CLINICAL DATA:  Patient pulled NG tube, NG tube placement EXAM: ABDOMEN - 1 VIEW COMPARISON:  11/19/2017 at 1312 hours FINDINGS: Nasogastric tube passes below the diaphragm to lie within the mid stomach. Multiple gallstones and right upper quadrant. Bowel gas pattern is unremarkable. IMPRESSION: Well-positioned nasogastric tube. Electronically Signed   By: Amie Portland M.D.   On: 11/19/2017 16:47   Dg Abd 1 View  Result Date: 11/19/2017 CLINICAL DATA:  Check nasogastric catheter placement EXAM: ABDOMEN - 1 VIEW COMPARISON:  11/19/2017 FINDINGS: Nasogastric catheter is again coiled upon itself but appears to be at least partially across the gastroesophageal junction.  This should be advanced several cm. Multiple gallstones are again seen. Scattered nonobstructive bowel  gas pattern is noted. IMPRESSION: Nasogastric catheter folded upon itself distally but appears to be at least partially in the stomach. This should be advanced several cm. Electronically Signed   By: Alcide CleverMark  Lukens M.D.   On: 11/19/2017 14:05   Dg Abd 1 View  Result Date: 11/19/2017 CLINICAL DATA:  Evaluate nasogastric tube placement. EXAM: ABDOMEN - 1 VIEW COMPARISON:  11/18/2017.  CT 11/07/2017 FINDINGS: The nasogastric tube appears to be coiled up near the GE junction. The tube has been pulled back since the previous examination. Nonobstructive bowel gas pattern with gas in the transverse colon. IMPRESSION: Nasogastric tube has been pulled back compared to the previous examination. The tube appears to be coiled up near the GE junction. Electronically Signed   By: Richarda OverlieAdam  Henn M.D.   On: 11/19/2017 10:22   Dg Abd 1 View  Result Date: 11/18/2017 CLINICAL DATA:  NG tube placement EXAM: ABDOMEN - 1 VIEW COMPARISON:  11/18/2017 FINDINGS: Esophageal tube tip overlies the expected location of gastric body. Right upper quadrant calcifications corresponding to gallstones. Visible upper gas pattern nonobstructed. IMPRESSION: Esophageal tube tip overlies expected location of gastric body. Electronically Signed   By: Jasmine PangKim  Fujinaga M.D.   On: 11/18/2017 20:56   Dg Abd 1 View  Result Date: 11/18/2017 CLINICAL DATA:  Encounter for nasogastric tube placement. EXAM: ABDOMEN - 1 VIEW COMPARISON:  Radiograph of November 18, 2017. FINDINGS: The bowel gas pattern is normal. Distal tip of nasogastric tube is seen in expected position of proximal stomach. Surgical clips are noted in the abdomen. Cholelithiasis is noted. IMPRESSION: Distal tip of nasogastric tube seen in expected position of proximal stomach. No evidence of bowel obstruction or ileus. Electronically Signed   By: Lupita RaiderJames  Green Jr, M.D.   On: 11/18/2017 12:19   Dg Abd 1 View  Result Date: 11/18/2017 CLINICAL DATA:  Nasogastric tube placement EXAM: ABDOMEN - 1 VIEW  COMPARISON:  Portable exam 1013 hours compared to 11/17/2017 FINDINGS: Tip of nasogastric tube projects over the distal esophagus, recommend advancing tube 12 cm into stomach. Bowel gas pattern normal. Calcified gallstones in  RIGHT upper quadrant. LEFT mid abdomen surgical clips. No acute osseous findings. IMPRESSION: Tip of nasogastric tube is at the distal esophagus; recommend advancing tube 12 cm. Cholelithiasis. Electronically Signed   By: Ulyses SouthwardMark  Boles M.D.   On: 11/18/2017 10:53   Dg Abd 1 View  Result Date: 11/17/2017 CLINICAL DATA:  Nasogastric tube placement. EXAM: ABDOMEN - 1 VIEW COMPARISON:  Radiograph of November 16, 2017. FINDINGS: The bowel gas pattern is normal. Distal tip of nasogastric tube is seen in expected position of distal stomach. IMPRESSION: No evidence of bowel obstruction or ileus. Distal tip of nasogastric tube seen in expected position of distal stomach. Electronically Signed   By: Lupita RaiderJames  Green Jr, M.D.   On: 11/17/2017 19:01   Dg Abd 1 View  Result Date: 11/14/2017 CLINICAL DATA:  Tracheostomy patient.  Nasogastric tube placement. EXAM: ABDOMEN - 1 VIEW COMPARISON:  Two days ago FINDINGS: Nasogastric tube tip overlaps the mid stomach. Diffuse bowel gas without obstructive pattern. No concerning mass effect or gas collection. Mild left base atelectasis. IMPRESSION: Nasogastric tube tip over the mid stomach. Electronically Signed   By: Marnee SpringJonathon  Watts M.D.   On: 11/14/2017 14:39   Dg Abd 1 View  Result Date: 11/12/2017 CLINICAL DATA:  NG tube placement. EXAM: ABDOMEN - 1 VIEW COMPARISON:  11/09/2017 FINDINGS: An enteric tube terminates over the distal stomach, slightly more distal than on the prior study. Surgical clips are present in the mid abdomen. There is mild gaseous distension of predominantly large bowel loops in the included portion of the abdomen. No dilated loops of bowel suggestive of mechanical obstruction are identified. No intraperitoneal free air is identified on  this supine study. No acute osseous abnormality is seen. IMPRESSION: Enteric tube in the distal stomach. Electronically Signed   By: Sebastian Ache M.D.   On: 11/12/2017 14:14   Dg Abd 1 View  Result Date: 11/09/2017 CLINICAL DATA:  Orogastric tube placement EXAM: ABDOMEN - 1 VIEW COMPARISON:  11/08/2017 FINDINGS: Gastric tube in the stomach with the tip in the body the stomach. Normal bowel gas pattern. Negative for bowel obstruction. IMPRESSION: Gastric tube body of stomach.  Normal bowel gas pattern. Electronically Signed   By: Marlan Palau M.D.   On: 11/09/2017 19:02   Dg Abd 1 View  Result Date: 11/08/2017 CLINICAL DATA:  Orogastric tube placement. EXAM: ABDOMEN - 1 VIEW COMPARISON:  None. FINDINGS: The orogastric tube extends well below the diaphragm. Tip projects in the distal stomach. IMPRESSION: Well-positioned orogastric tube, tip projecting in the distal stomach. Electronically Signed   By: Amie Portland M.D.   On: 11/08/2017 17:07   Ct Head Wo Contrast  Result Date: 11/08/2017 CLINICAL DATA:  62 year old female with altered mental status. EXAM: CT HEAD WITHOUT CONTRAST TECHNIQUE: Contiguous axial images were obtained from the base of the skull through the vertex without intravenous contrast. COMPARISON:  None. FINDINGS: Evaluation of this exam is limited due to motion artifact. Brain: There is mild age-related atrophy and chronic microvascular ischemic changes. There is no acute intracranial hemorrhage. No mass effect or midline shift. No extra-axial fluid collection. Vascular: Atherosclerotic calcification of the cerebral vasculature. Skull: Normal. Negative for fracture or focal lesion. Sinuses/Orbits: No acute finding. Other: None IMPRESSION: No definite acute intracranial hemorrhage on this severely motion degraded exam. Electronically Signed   By: Elgie Collard M.D.   On: 11/08/2017 02:35   Mr Brain Wo Contrast  Result Date: 11/09/2017 CLINICAL DATA:  Initial evaluation for acute  encephalopathy. Evaluate for new stroke. Left-sided weakness. EXAM: MRI HEAD WITHOUT CONTRAST TECHNIQUE: Multiplanar, multiecho pulse sequences of the brain and surrounding structures were obtained without intravenous contrast. COMPARISON:  Comparison made with prior CT from 11/08/2017. FINDINGS: Brain: Generalized age-related cerebral atrophy, with more pronounced cerebellar atrophy noted. Patchy T2/FLAIR hyperintensity within the periventricular and deep white matter both cerebral hemispheres, most consistent with chronic small vessel ischemic disease, mild for age. Superimposed remote lacunar infarcts present within the right basal ganglia and left thalamus as well as the pons. No abnormal foci of restricted diffusion to suggest acute or subacute ischemia. Gray-white matter differentiation maintained. No other is of chronic infarction. No acute or chronic intracranial hemorrhage. No mass lesion, midline shift or mass effect. No hydrocephalus. No extra-axial fluid or scar. Major dural sinuses are patent. Pituitary gland suprasellar region normal. Vascular: Major intracranial vascular flow voids are maintained. Skull and upper cervical spine: Craniocervical junction within normal limits. Upper cervical spine demonstrates mild degenerative spondylolysis without significant stenosis. Bone marrow signal intensity within normal limits. Mild dilatation of Foley noted. Scalp soft tissues within normal limits. Sinuses/Orbits: Globes orbital soft tissues within normal limits. Scattered mucosal thickening throughout the paranasal sinuses. Layering fluid seen within the nasopharynx. Patient is intubated. Small bilateral mastoid effusions noted. Inner ear structures grossly normal. Other: None.  IMPRESSION: 1. No acute intracranial infarct or other abnormality identified. 2. Mild chronic small vessel ischemic disease with small remote lacunar infarcts involving the right basal ganglia, left thalamus, and pons. Electronically  Signed   By: Rise Mu M.D.   On: 11/09/2017 21:43   Dg Chest Port 1 View  Result Date: 11/24/2017 CLINICAL DATA:  Respiratory failure EXAM: PORTABLE CHEST 1 VIEW COMPARISON:  11/23/2017 FINDINGS: Cardiac shadow is mildly enlarged but stable. Tracheostomy tube and nasogastric catheter are again seen and stable. Lungs are well aerated bilaterally without focal infiltrate or sizable effusion. IMPRESSION: No acute abnormality noted. Electronically Signed   By: Alcide Clever M.D.   On: 11/24/2017 07:29   Dg Chest Port 1 View  Result Date: 11/23/2017 CLINICAL DATA:  Acute respiratory failure, tracheostomy patient, sepsis, acute encephalopathy, former smoker. EXAM: PORTABLE CHEST 1 VIEW COMPARISON:  Portable chest x-ray of November 18, 2017 FINDINGS: The lungs are well-expanded. The interstitial markings in the right lung are mildly increased overall as compared to the left. There is no significant pleural effusion and no pneumothorax. The cardiac silhouette remains enlarged. The pulmonary vascularity is mildly engorged. The tracheostomy tube tip projects between the clavicular heads. The esophagogastric tube tip in proximal port project below the GE junction. IMPRESSION: Mild overall increase in the prominence of the pulmonary interstitium on the right. This may reflect asymmetric pulmonary edema of cardiac or noncardiac cause. A diffuse interstitial pneumonia is felt less likely. There is no alveolar pneumonia on the right or left. Mild cardiomegaly with increased pulmonary vascular prominence. The support tubes are in reasonable position. Electronically Signed   By: David  [place] M.D.   On: 11/23/2017 07:16   Dg Chest Port 1 View  Result Date: 11/18/2017 CLINICAL DATA:  Possible UTI. EXAM: PORTABLE CHEST 1 VIEW COMPARISON:  11/15/2017. FINDINGS: Tracheostomy tube noted with tip over the trachea. Interval removal of NG tube. Cardiomegaly with normal pulmonary vascularity. No focal infiltrate. No pleural  effusion or pneumothorax. Degenerative change thoracic spine. IMPRESSION: 1. Interval removal of NG tube. Tracheostomy tube noted with tip over the trachea. 2. Cardiomegaly with normal pulmonary vascularity. No acute pulmonary disease noted. Electronically Signed   By: Maisie Fus  Register   On: 11/18/2017 09:41   Dg Chest Port 1 View  Result Date: 11/15/2017 CLINICAL DATA:  62 year old female admitted last week with sepsis. EXAM: PORTABLE CHEST 1 VIEW COMPARISON:  11/13/2017 and earlier. FINDINGS: Portable AP semi upright view at 0857 hours. Stable tracheostomy tube. Enteric tube courses to the left abdomen, tip not included. Mildly larger lung volumes. Decreased streaky opacity at the lung bases, residual greater on the left. No pneumothorax, pulmonary edema, pleural effusion or consolidation. Mediastinal contours are stable and within normal limits. Negative visible bowel gas pattern. No acute osseous abnormality identified. IMPRESSION: 1.  Stable lines and tubes. 2. Mild atelectasis.  No other acute cardiopulmonary abnormality. Electronically Signed   By: Odessa Fleming M.D.   On: 11/15/2017 09:16   Dg Chest Port 1 View  Result Date: 11/13/2017 CLINICAL DATA:  Tracheostomy tube EXAM: PORTABLE CHEST 1 VIEW COMPARISON:  Yesterday FINDINGS: Tracheostomy tube in place. New nasogastric tube which reaches the stomach. There is a low volume chest with streaky opacities favoring atelectasis. No Kerley lines, effusion, or pneumothorax. Cardiopericardial enlargement. IMPRESSION: 1. Lower lung volumes with increased atelectasis. 2. Cardiomegaly. Electronically Signed   By: Marnee Spring M.D.   On: 11/13/2017 07:48   Dg Chest Port 1 View  Result Date: 11/12/2017 CLINICAL  DATA:  Intermittent chest pain, recent leg fracture EXAM: PORTABLE CHEST 1 VIEW COMPARISON:  11/11/2017 FINDINGS: Interval tracheostomy tube placement in satisfactory position. Interval removal of the nasogastric tube. Right lower lobe airspace disease  which may reflect atelectasis versus pneumonia. No pleural effusion. No pneumothorax. Stable cardiomegaly. No acute osseous abnormality. Mild osteoarthritis of bilateral glenohumeral joints. IMPRESSION: Interval tracheostomy tube placement in satisfactory position. Interval removal of the nasogastric tube. Right lower lobe airspace disease which may reflect atelectasis versus pneumonia. Electronically Signed   By: Elige Ko   On: 11/12/2017 13:07   Dg Chest Port 1 View  Result Date: 11/11/2017 CLINICAL DATA:  Endotracheal tube position EXAM: PORTABLE CHEST 1 VIEW COMPARISON:  11/10/2017 FINDINGS: Endotracheal tube in good position.  Gastric tube in the stomach Bibasilar airspace disease left greater than right unchanged. No significant effusion. No pneumothorax. IMPRESSION: Bibasilar airspace disease left greater than right unchanged. Endotracheal tube in good position. Electronically Signed   By: Marlan Palau M.D.   On: 11/11/2017 07:16   Dg Chest Port 1 View  Result Date: 11/10/2017 CLINICAL DATA:  ETT position EXAM: PORTABLE CHEST 1 VIEW COMPARISON:  Chest x-rays dated 11/09/2017 and 11/08/2017. FINDINGS: Endotracheal tube is well positioned with tip approximately 3 cm above the level of the carina. Enteric tube passes below the diaphragm. Left lower lobe opacities are stable, presumed atelectasis and/or small effusion. No new lung findings. No pneumothorax seen. IMPRESSION: 1. Endotracheal tube well positioned with tip approximately 3 cm above the carina. 2. Persistent left lower lobe opacities, stable, probable atelectasis and/or small pleural effusion. No new lung findings. Electronically Signed   By: Bary Richard M.D.   On: 11/10/2017 09:16   Dg Chest Port 1 View  Result Date: 11/09/2017 CLINICAL DATA:  Hypoxia EXAM: PORTABLE CHEST 1 VIEW COMPARISON:  November 08, 2017 FINDINGS: Endotracheal tube tip is 3.6 cm above the carina. Nasogastric tube tip and side port are below the diaphragm. No  pneumothorax. There is airspace consolidation in the left base. Lungs elsewhere are clear. There is cardiomegaly with pulmonary vascularity within normal limits. No adenopathy. No evident bone lesions. IMPRESSION: Tube positions as described without pneumothorax. Patchy consolidation left base. Lungs elsewhere clear. Stable cardiomegaly. Electronically Signed   By: Bretta Bang III M.D.   On: 11/09/2017 08:02   Dg Chest Port 1 View  Result Date: 11/08/2017 CLINICAL DATA:  Status post ET tube placement. EXAM: PORTABLE CHEST 1 VIEW COMPARISON:  Portable film earlier in the day. FINDINGS: Cardiac enlargement. ET tube has been inserted, and lies 3.7 cm above carina. Nasogastric tube is coiled in the stomach. Aeration is improved with partial clearing of edema. No consolidation. IMPRESSION: Improved aeration post ET tube placement. Electronically Signed   By: Elsie Stain M.D.   On: 11/08/2017 17:05   Dg Chest Port 1 View  Result Date: 11/08/2017 CLINICAL DATA:  62 year old female with hypoxia. EXAM: PORTABLE CHEST 1 VIEW COMPARISON:  Chest radiograph dated 11/07/2017 FINDINGS: Shallow inspiration with minimal bibasilar atelectasis. No focal consolidation, pleural effusion, or pneumothorax. There is borderline cardiomegaly with mild central vascular prominence which may represent mild vascular congestion. Chronic subluxed appearance of the left shoulder. No acute osseous pathology. IMPRESSION: Probable mild vascular congestion.  No focal consolidation. Electronically Signed   By: Elgie Collard M.D.   On: 11/08/2017 05:20   Dg Abd 2 Views  Result Date: 11/07/2017 CLINICAL DATA:  Diffuse abdominal pain EXAM: ABDOMEN - 2 VIEW COMPARISON:  None. FINDINGS: Scattered large and  small bowel gas is noted. Mild retained fecal material is seen. No obstructive changes are noted. No free air is seen. No acute bony abnormality is noted. IMPRESSION: No acute abnormality seen. Electronically Signed   By: Alcide Clever M.D.   On: 11/07/2017 19:10   Dg Abd Portable 1v  Result Date: 11/16/2017 CLINICAL DATA:  Tachycardia.  Vomiting. EXAM: PORTABLE ABDOMEN - 1 VIEW COMPARISON:  11/14/2017. FINDINGS: Gastric tube remains within the stomach. There is moderate gaseous distention of small and large bowel. Distal colonic gas appears to be present. Query LEFT lung base consolidation or atelectasis. IMPRESSION: Moderate gaseous distension appears slightly increased from priors. Query ileus. NG tube in stomach. Electronically Signed   By: Elsie Stain M.D.   On: 11/16/2017 09:25   Dg Swallowing Func-speech Pathology  Result Date: 11/26/2017 Objective Swallowing Evaluation: Type of Study: MBS-Modified Barium Swallow Study  Patient Details Name: Kenzy Campoverde MRN: [id number] Date of Birth: 04-11-1956 Today's Date: 11/26/2017 Time: SLP Start Time (ACUTE ONLY): 1435 -SLP Stop Time (ACUTE ONLY): 1505 SLP Time Calculation (min) (ACUTE ONLY): 30 min Past Medical History: Past Medical History: Diagnosis Date . Hypertension  . Stroke [facility])   Left sided weakness  Past Surgical History: No past surgical history on file. HPI: Grace [name] 62 year old female with past medical history of CVA with left hemiparesis, hypertension and CAD who presented to the emergency department with fevers and elevated blood pressure.  She was admitted on 3/24 after being found encephalopathic and septic due to UTI.  On 3/25 she had acute changes on mental status and PCCM was consulted. Patient was intubated for airway protection and worsening HCRF, patient self extubated on 3/26, failed with continued hypoxia and was remains intubated due to ongoing AMS.  Patient underwent trach 3/29.  Patient completed treatment for UTI per MD notes.  Patient developed possible ileus due to poor mobility.  Patient now on trach support with vent at night.  PMSV ordered.   Subjective: pt awake in chair Assessment / Plan / Recommendation CHL IP CLINICAL IMPRESSIONS  11/26/2017 Clinical Impression Patient presents with moderate oropharyngeal dysphagia without aspiration.  She did demonstrate premature spillage of barium into pharynx, piecemealing and decreased timing of laryngeal closure/swallow resulting in laryngeal penetration of thin liquids.   Pharyngeal swallow fortunately is strong without residuals!  Pt did demonstrate reflexive cough with deeper penetration of thin liquids.  Recommend avoid NG tube and allow pt to have dys1/nectar diet with full supervision and pmsv in place.  Hopefully pt may be able to avoid PEG.  Using live video, educated pt to findings/recommendations.   SLP Visit Diagnosis Dysphagia, oropharyngeal phase (R13.12) Attention and concentration deficit following -- Frontal lobe and executive function deficit following -- Impact on safety and function Moderate aspiration risk   CHL IP TREATMENT RECOMMENDATION 11/26/2017 Treatment Recommendations Therapy as outlined in treatment plan below   Prognosis 11/26/2017 Prognosis for Safe Diet Advancement Fair Barriers to Reach Goals -- Barriers/Prognosis Comment -- CHL IP DIET RECOMMENDATION 11/26/2017 SLP Diet Recommendations Dysphagia 1 (Puree) solids;Nectar thick liquid Liquid Administration via Cup;Straw Medication Administration Whole meds with puree Compensations Slow rate;Small sips/bites;Other (Comment) Postural Changes Seated upright at 90 degrees;Remain semi-upright after after feeds/meals (Comment)   CHL IP OTHER RECOMMENDATIONS 11/26/2017 Recommended Consults -- Oral Care Recommendations Oral care QID;Oral care before and after PO Other Recommendations Have oral suction available   CHL IP FOLLOW UP RECOMMENDATIONS 11/26/2017 Follow up Recommendations Skilled Nursing facility   CHL IP FREQUENCY AND DURATION  11/26/2017 Speech Therapy Frequency (ACUTE ONLY) min 2x/week Treatment Duration 2 weeks      CHL IP ORAL PHASE 11/26/2017 Oral Phase Impaired Oral - Pudding Teaspoon -- Oral - Pudding Cup -- Oral - Honey  Teaspoon -- Oral - Honey Cup -- Oral - Nectar Teaspoon Premature spillage;Lingual/palatal residue;Piecemeal swallowing Oral - Nectar Cup Decreased bolus cohesion;Premature spillage;Delayed oral transit;Lingual/palatal residue;Piecemeal swallowing Oral - Nectar Straw Decreased bolus cohesion;Premature spillage;Delayed oral transit;Lingual/palatal residue;Piecemeal swallowing Oral - Thin Teaspoon Premature spillage;Decreased bolus cohesion;Delayed oral transit;Lingual/palatal residue;Piecemeal swallowing Oral - Thin Cup Premature spillage;Decreased bolus cohesion;Delayed oral transit;Lingual/palatal residue;Piecemeal swallowing Oral - Thin Straw Decreased bolus cohesion;Premature spillage;Lingual/palatal residue;Piecemeal swallowing Oral - Puree Premature spillage;Decreased bolus cohesion;Delayed oral transit;Lingual/palatal residue;Piecemeal swallowing Oral - Mech Soft Decreased bolus cohesion;Premature spillage;Weak lingual manipulation;Delayed oral transit;Lingual/palatal residue Oral - Regular -- Oral - Multi-Consistency -- Oral - Pill Weak lingual manipulation;Delayed oral transit;Lingual/palatal residue Oral Phase - Comment --  CHL IP PHARYNGEAL PHASE 11/26/2017 Pharyngeal Phase Impaired Pharyngeal- Pudding Teaspoon -- Pharyngeal -- Pharyngeal- Pudding Cup -- Pharyngeal -- Pharyngeal- Honey Teaspoon -- Pharyngeal -- Pharyngeal- Honey Cup -- Pharyngeal -- Pharyngeal- Nectar Teaspoon Delayed swallow initiation-vallecula Pharyngeal -- Pharyngeal- Nectar Cup Delayed swallow initiation-vallecula Pharyngeal -- Pharyngeal- Nectar Straw Delayed swallow initiation-vallecula Pharyngeal -- Pharyngeal- Thin Teaspoon Delayed swallow initiation-vallecula Pharyngeal -- Pharyngeal- Thin Cup Penetration/Aspiration during swallow;Penetration/Apiration after swallow;Delayed swallow initiation-vallecula Pharyngeal Material enters airway, remains ABOVE vocal cords and not ejected out Pharyngeal- Thin Straw Delayed swallow  initiation-vallecula;Penetration/Aspiration during swallow;Penetration/Apiration after swallow Pharyngeal Material enters airway, remains ABOVE vocal cords and not ejected out;Material enters airway, CONTACTS cords and then ejected out Pharyngeal- Puree Delayed swallow initiation-vallecula Pharyngeal -- Pharyngeal- Mechanical Soft Delayed swallow initiation-vallecula Pharyngeal -- Pharyngeal- Regular -- Pharyngeal -- Pharyngeal- Multi-consistency -- Pharyngeal -- Pharyngeal- Pill Delayed swallow initiation-vallecula Pharyngeal -- Pharyngeal Comment --  CHL IP CERVICAL ESOPHAGEAL PHASE 11/26/2017 Cervical Esophageal Phase WFL Pudding Teaspoon -- Pudding Cup -- Honey Teaspoon -- Honey Cup -- Nectar Teaspoon -- Nectar Cup -- Nectar Straw -- Thin Teaspoon -- Thin Cup -- Thin Straw -- Puree -- Mechanical Soft -- Regular -- Multi-consistency -- Pill -- Cervical Esophageal Comment -- Donavan Burnet, MS [facility] SLP [phone] 11/26/2017, 4:35 PM               Micro Results   Recent Results (from the past 240 hour(s))  Culture, blood (Routine X 2) w Reflex to ID Panel     Status: None   Collection Time: 11/23/17 10:33 AM  Result Value Ref Range Status   Specimen Description   Final    BLOOD RIGHT HAND Performed at [facility], [address], [place], [place] [address]    Special Requests   Final    BOTTLES DRAWN AEROBIC AND ANAEROBIC Blood Culture adequate volume Performed at [facility], [address], [place], [place] [address]    Culture   Final    NO GROWTH 5 DAYS Performed at [facility], [address] [place], [place] [address]    Report Status 11/28/2017 FINAL  Final  Urine Culture     Status: None   Collection Time: 11/23/17 10:41 AM  Result Value Ref Range Status   Specimen Description   Final    URINE, CATHETERIZED Performed at [facility], [address], [place], [place] [address]    Special Requests   Final     NONE Performed at [facility], [address] [place], [place] [address]  Culture   Final    NO GROWTH Performed at [facility], [address] [place], [place] [address]    Report Status 11/24/2017 FINAL  Final  Culture, blood (Routine X 2) w Reflex to ID Panel     Status: Abnormal   Collection Time: 11/23/17 11:11 AM  Result Value Ref Range Status   Specimen Description   Final    BLOOD RIGHT HAND Performed at [facility], [address], [place], [place] [address]    Special Requests   Final    BOTTLES DRAWN AEROBIC AND ANAEROBIC Blood Culture adequate volume Performed at [facility], [address], [place], [place] [address]    Culture  Setup Time   Final    GRAM POSITIVE COCCI IN BOTH AEROBIC AND ANAEROBIC BOTTLES CRITICAL RESULT CALLED TO, READ BACK BY AND VERIFIED WITH: Haze Boyden PharmD 14:25 11/24/17 ([email or web address]) Performed at [name], [address], [place], [place] [address]    Culture STAPHYLOCOCCUS SPECIES (COAGULASE NEGATIVE) (A)  Final   Report Status 11/27/2017 FINAL  Final   Organism ID, Bacteria STAPHYLOCOCCUS SPECIES (COAGULASE NEGATIVE)  Final      Susceptibility   Staphylococcus species (coagulase negative) - MIC*    CIPROFLOXACIN 1 SENSITIVE Sensitive     ERYTHROMYCIN >=8 RESISTANT Resistant     GENTAMICIN <=0.5 SENSITIVE Sensitive     OXACILLIN SENSITIVE Sensitive     TETRACYCLINE <=1 SENSITIVE Sensitive     VANCOMYCIN <=0.5 SENSITIVE Sensitive     TRIMETH/SULFA <=10 SENSITIVE Sensitive     CLINDAMYCIN INTERMEDIATE Intermediate     RIFAMPIN <=0.5 SENSITIVE Sensitive     Inducible Clindamycin NEGATIVE Sensitive     * STAPHYLOCOCCUS SPECIES (COAGULASE NEGATIVE)  Blood Culture ID Panel (Reflexed)     Status: Abnormal   Collection Time: 11/23/17 11:11 AM  Result Value Ref Range Status   Enterococcus species NOT DETECTED NOT DETECTED Final   Listeria monocytogenes NOT  DETECTED NOT DETECTED Final   Staphylococcus species DETECTED (A) NOT DETECTED Final    Comment: Methicillin (oxacillin) resistant coagulase negative staphylococcus. Possible blood culture contaminant (unless isolated from more than one blood culture draw or clinical case suggests pathogenicity). No antibiotic treatment is indicated for blood  culture contaminants. CRITICAL RESULT CALLED TO, READ BACK BY AND VERIFIED WITH: Haze Boyden PharmD 14:25 11/24/17 ([email or web address])    Staphylococcus aureus NOT DETECTED NOT DETECTED Final   Methicillin resistance DETECTED (A) NOT DETECTED Final    Comment: CRITICAL RESULT CALLED TO, READ BACK BY AND VERIFIED WITH: Haze Boyden PharmD 14:25 11/24/17 ([email or web address])    Streptococcus species NOT DETECTED NOT DETECTED Final   Streptococcus agalactiae NOT DETECTED NOT DETECTED Final   Streptococcus pneumoniae NOT DETECTED NOT DETECTED Final   Streptococcus pyogenes NOT DETECTED NOT DETECTED Final   Acinetobacter baumannii NOT DETECTED NOT DETECTED Final   Enterobacteriaceae species NOT DETECTED NOT DETECTED Final   Enterobacter cloacae complex NOT DETECTED NOT DETECTED Final   Escherichia coli NOT DETECTED NOT DETECTED Final   Klebsiella oxytoca NOT DETECTED NOT DETECTED Final   Klebsiella pneumoniae NOT DETECTED NOT DETECTED Final   Proteus species NOT DETECTED NOT DETECTED Final   Serratia marcescens NOT DETECTED NOT DETECTED Final   Haemophilus influenzae NOT DETECTED NOT DETECTED Final   Neisseria meningitidis NOT DETECTED NOT DETECTED Final   Pseudomonas aeruginosa NOT DETECTED NOT DETECTED Final   Candida albicans NOT DETECTED NOT DETECTED Final   Candida glabrata NOT DETECTED NOT  DETECTED Final   Candida krusei NOT DETECTED NOT DETECTED Final   Candida parapsilosis NOT DETECTED NOT DETECTED Final   Candida tropicalis NOT DETECTED NOT DETECTED Final    Comment: Performed at [facility], [address] [place], [place] [address]  Culture, blood  (routine x 2)     Status: None   Collection Time: 11/25/17  5:25 AM  Result Value Ref Range Status   Specimen Description   Final    BLOOD RIGHT HAND Performed at [facility], [address], [place], [place] [address]    Special Requests   Final    BOTTLES DRAWN AEROBIC ONLY Blood Culture adequate volume Performed at [facility], [address], [place], [place] [address]    Culture   Final    NO GROWTH 5 DAYS Performed at [facility], [address], [place], [place] [address]    Report Status 11/30/2017 FINAL  Final  Culture, blood (routine x 2)     Status: Abnormal   Collection Time: 11/25/17  5:38 AM  Result Value Ref Range Status   Specimen Description   Final    BLOOD LEFT HAND Performed at [facility], [address] [place], [place] [address]    Special Requests   Final    BOTTLES DRAWN AEROBIC AND ANAEROBIC Blood Culture adequate volume   Culture  Setup Time   Final    GRAM POSITIVE COCCI IN BOTH AEROBIC AND ANAEROBIC BOTTLES CRITICAL RESULT CALLED TO, READ BACK BY AND VERIFIED WITH: M RENZ PHARM D 11/26/17 AT 900 BY CM    Culture (A)  Final    STAPHYLOCOCCUS SPECIES (COAGULASE NEGATIVE) SUSCEPTIBILITIES PERFORMED ON PREVIOUS CULTURE WITHIN THE LAST 5 DAYS. Performed at [facility], [address] [place], [place] [address]    Report Status 11/28/2017 FINAL  Final  Culture, blood (Routine X 2) w Reflex to ID Panel     Status: None (Preliminary result)   Collection Time: 11/27/17  6:40 PM  Result Value Ref Range Status   Specimen Description BLOOD LEFT HAND  Final   Special Requests   Final    BOTTLES DRAWN AEROBIC ONLY Blood Culture adequate volume   Culture   Final    NO GROWTH 3 DAYS Performed at [facility], [address] [place], [place] [address]    Report Status PENDING  Incomplete  Culture, blood (Routine X 2) w Reflex to ID Panel     Status: None (Preliminary result)    Collection Time: 11/27/17  6:40 PM  Result Value Ref Range Status   Specimen Description BLOOD LEFT FOREARM  Final   Special Requests   Final    BOTTLES DRAWN AEROBIC ONLY Blood Culture results may not be optimal due to an inadequate volume of blood received in culture bottles   Culture   Final    NO GROWTH 3 DAYS Performed at [facility], [address], [place], [place] [address]    Report Status PENDING  Incomplete   Today   Subjective    Melva Faux today has no new complaints, tolerating pured solids and nectar thickened liquids well, talking and communicating appropriately, cognitively much better          Patient has been seen and examined prior to discharge   Objective   Blood pressure 134/75, pulse 91, temperature 99.6 F (37.6 C), temperature source Oral, resp. rate 18, height 5'  4" (1.626 m), weight 86.1 kg (189 lb 13.1 oz), SpO2 96 %.   Intake/Output Summary (Last 24 hours) at 11/30/2017 1458 Last data filed at 11/30/2017 1151 Gross per 24 hour  Intake 480 ml  Output 1300 ml  Net -820 ml   Exam Gen:- Awake Alert,  follow commands, talking HEENT:- NCAT, pupils are reactive Neck- #6 cuffless trach with trach collar  lungs-improved air movement without wheezing CV- S1, S2 normal Abd-  +ve B.Sounds, Abd Soft, No tenderness,    Extremity/Skin:-Skin is warm and dry  psych-affect is appropriate, able to follow commands, answers questions appropriately, cognitively much better  Neuro-left hemiplegia appears to be at baseline    Data Review   CBC w Diff:  Lab Results  Component Value Date   WBC 5.7 11/28/2017   HGB 9.7 (L) 11/28/2017   HCT 31.4 (L) 11/28/2017   PLT 622 (H) 11/28/2017   LYMPHOPCT 22 11/19/2017   MONOPCT 15 11/19/2017   EOSPCT 2 11/19/2017   BASOPCT 0 11/19/2017    CMP:  Lab Results  Component Value Date   NA 139 11/30/2017   K 3.9 11/30/2017   CL 103 11/30/2017   CO2 26 11/30/2017   BUN 9 11/30/2017   CREATININE  0.90 11/30/2017   PROT 6.9 11/18/2017   ALBUMIN 2.7 (L) 11/18/2017   BILITOT 0.7 11/18/2017   ALKPHOS 47 11/18/2017   AST 16 11/18/2017   ALT 12 (L) 11/18/2017   Total Discharge time is about 33 minutes  [name] M.D on 11/30/2017 at 2:58 PM  [facility]   Office  [phone]  Voice Recognition Reubin Milan dictation system was used to create this note, attempts have been made to correct errors. Please contact the author with questions and/or clarifications.

## 2017-11-30 NOTE — Discharge Instructions (Signed)
1)Routine Trach Care--- she has trach with trach collar, suction as needed per protocol 2) patient is at risk for aspiration-pured solids and nectar thickened liquids advised to be fed at at least 45 degree angle 3)Speech and Swallow Reevaluation in a couple of weeks 4)Please encourage adequate fluid intake to prevent dehydration 5) placed on patient frequently from side-to-side to prevent decubitus ulcers

## 2017-11-30 NOTE — Progress Notes (Signed)
Pt suctioned and trach dressing changed. Pt tolerated well.

## 2017-11-30 NOTE — Progress Notes (Signed)
Report Called to [facility]

## 2017-11-30 NOTE — Progress Notes (Signed)
LCSW following for SNF placement.   Patient from [facility]. LCSW confirmed return.   LCSW faxed dc docs to facility.   Patient will transport by [facility].   LCSW notified family.   RN report #: [phone]  BKJ

## 2017-12-02 LAB — CULTURE, BLOOD (ROUTINE X 2)
Culture: NO GROWTH
Culture: NO GROWTH
SPECIAL REQUESTS: ADEQUATE

## 2017-12-17 ENCOUNTER — Other Ambulatory Visit (HOSPITAL_COMMUNITY): Payer: Self-pay | Admitting: Family Medicine

## 2017-12-17 DIAGNOSIS — R1310 Dysphagia, unspecified: Secondary | ICD-10-CM

## 2017-12-23 ENCOUNTER — Ambulatory Visit (HOSPITAL_COMMUNITY)
Admission: RE | Admit: 2017-12-23 | Discharge: 2017-12-23 | Disposition: A | Discharge status: Home / Self Care | Payer: Medicaid Other | Source: Ambulatory Visit | Attending: Family Medicine | Admitting: Family Medicine

## 2017-12-23 DIAGNOSIS — R1310 Dysphagia, unspecified: Secondary | ICD-10-CM | POA: Insufficient documentation

## 2017-12-23 NOTE — Progress Notes (Signed)
Modified Barium Swallow Progress Note  Patient Details  Name: Grace [name] MRN: [id number] Date of Birth: Aug 21, 1955  Today's Date: 12/23/2017  Modified Barium Swallow completed.  Full report located under Chart Review in the Imaging Section.  Brief recommendations include the following:  Clinical Impression  Pt demonstrates oropharyngeal dysphagia essentially unchanged from prior MBS. Pt is able to masticate and transit solids without difficutly, which must be improved from prior, but mild oral dis-coordination still impacts safety with liquids. In 80% of thin liquid trials swallow is [facility] with only trace flash penetration. There were several instances of premature spillage of bolus or spillage of trace oral residue post swallow with poor coordination of swallow response resulting in mild silent aspiration events. Reducing thin liquids to a teaspoon quantity elimnated aspiration, which could be achieved functionally with a [place] cup. A sustained chin tuck with a straw also improved timeliness of airway protection even with large consecutive sips. Defer plan of care and diet to primary SLP.    Swallow Evaluation Recommendations       SLP Diet Recommendations: Other (Comment)(defer to primary SLP)       Medication Administration: Whole meds with puree                       Harlon Ditty, MA CCC-SLP [phone]  Claudine Mouton 12/23/2017,2:11 PM

## 2018-01-23 ENCOUNTER — Emergency Department (HOSPITAL_COMMUNITY)
Admission: EM | Admit: 2018-01-23 | Discharge: 2018-01-24 | Disposition: A | Discharge status: Skilled Nursing Facility | Payer: Medicaid Other | Attending: Emergency Medicine | Admitting: Emergency Medicine

## 2018-01-23 DIAGNOSIS — Z430 Encounter for attention to tracheostomy: Secondary | ICD-10-CM | POA: Diagnosis not present

## 2018-01-23 DIAGNOSIS — Z87891 Personal history of nicotine dependence: Secondary | ICD-10-CM | POA: Diagnosis not present

## 2018-01-23 DIAGNOSIS — F419 Anxiety disorder, unspecified: Secondary | ICD-10-CM | POA: Insufficient documentation

## 2018-01-23 DIAGNOSIS — R0602 Shortness of breath: Secondary | ICD-10-CM | POA: Insufficient documentation

## 2018-01-23 DIAGNOSIS — Z79899 Other long term (current) drug therapy: Secondary | ICD-10-CM | POA: Insufficient documentation

## 2018-01-23 DIAGNOSIS — Z7902 Long term (current) use of antithrombotics/antiplatelets: Secondary | ICD-10-CM | POA: Insufficient documentation

## 2018-01-23 DIAGNOSIS — Z794 Long term (current) use of insulin: Secondary | ICD-10-CM | POA: Diagnosis not present

## 2018-01-23 DIAGNOSIS — I10 Essential (primary) hypertension: Secondary | ICD-10-CM | POA: Diagnosis not present

## 2018-01-23 NOTE — ED Notes (Signed)
Bed: AV40WA19 Expected date:  Expected time:  Means of arrival:  Comments: Ems trach issues

## 2018-01-23 NOTE — ED Provider Notes (Signed)
[facility]-EMERGENCY DEPT Provider Note   CSN: [id number] Arrival date & time: 01/23/18  2345     History   Chief Complaint Chief Complaint  Patient presents with  . Shortness of Breath  . Anxiety    HPI Grace [name] is a 62 y.o. female.  HPI   62 yo F with h/o HTN, CVA, resp failure s/p trach placement here with SOB. Pt reportedly was at her SNF today when her cuffless trach fell off. She is on venturi at night. The provider at facility reportedly wanted to see "how she did" and did not replace the trach but placed pt on 2 L [place]. Over the day, she has become increasingly SOB and had complaints of anxiety. She has not had any cough. She was well prior to the trach falling out. No recent med changes. She is at her mental baseline.  Level 5 caveat invoked as remainder of history, ROS, and physical exam limited due to patient's CVA/h/o stroke.   Past Medical History:  Diagnosis Date  . Hypertension   . Stroke [facility])    Left sided weakness     Patient Active Problem List   Diagnosis Date Noted  . Tracheostomy status (HCC)   . Acute encephalopathy   . Acute respiratory failure with hypoxemia (HCC)   . Hypertensive emergency   . Hypoxia   . Sepsis (HCC) 11/07/2017    History reviewed. No pertinent surgical history.   OB History   None      Home Medications    Prior to Admission medications   Medication Sig Start Date End Date Taking? Authorizing Provider  acetaminophen (TYLENOL) 325 MG tablet Take 650 mg by mouth 3 (three) times daily.    Yes [provider]  acetaminophen (TYLENOL) 325 MG tablet Take 650 mg by mouth every 6 (six) hours as needed for mild pain, moderate pain, fever or headache.   Yes [provider]  alendronate (FOSAMAX) 70 MG tablet Take 70 mg by mouth every Wednesday. Take with a full glass of water on an empty stomach.    Yes [provider]  amLODipine (NORVASC) 10 MG tablet Take 1 tablet (10  mg total) by mouth daily. 12/01/17  Yes Emokpae, Courage, MD  atorvastatin (LIPITOR) 10 MG tablet Take 30 mg by mouth at bedtime.   Yes [provider]  calcium-vitamin D (OSCAL WITH D) 500-200 MG-UNIT tablet Take 1 tablet by mouth 2 (two) times daily.   Yes [provider]  cloNIDine (CATAPRES) 0.2 MG tablet Take 1 tablet (0.2 mg total) by mouth 3 (three) times daily. 11/30/17  Yes Emokpae, Courage, MD  clopidogrel (PLAVIX) 75 MG tablet Take 75 mg by mouth daily.   Yes [provider]  DULoxetine (CYMBALTA) 60 MG capsule Take 60 mg by mouth daily.   Yes [provider]  ferrous sulfate 325 (65 FE) MG EC tablet Take 325 mg by mouth at bedtime.   Yes [provider]  gabapentin (NEURONTIN) 300 MG capsule Take 600 mg by mouth at bedtime.    Yes [provider]  guaiFENesin (ROBITUSSIN) 100 MG/5ML SOLN Take 15 mLs by mouth every 4 (four) hours as needed for cough or to loosen phlegm.   Yes [provider]  hydrALAZINE (APRESOLINE) 100 MG tablet Take 1 tablet (100 mg total) by mouth 3 (three) times daily. 11/30/17  Yes Emokpae, Courage, MD  insulin glargine (LANTUS) 100 UNIT/ML injection Inject 0.25 mLs (25 Units total) into the  skin daily. 12/01/17  Yes Emokpae, Courage, MD  insulin lispro (HUMALOG) 100 UNIT/ML injection Inject 2-8 Units into the skin 2 (two) times daily. 201-250= 2 units 251-300= 4 units 301-350- 6 units 351-400= 8 units > 400 Call MD   Yes [provider]  isosorbide mononitrate (IMDUR) 30 MG 24 hr tablet Take 1 tablet (30 mg total) by mouth daily. 11/30/17 11/30/18 Yes Emokpae, Courage, MD  labetalol (NORMODYNE) 200 MG tablet Take 1 tablet (200 mg total) by mouth 3 (three) times daily. 11/30/17  Yes Emokpae, Courage, MD  montelukast (SINGULAIR) 10 MG tablet Take 10 mg by mouth at bedtime.   Yes [provider]  nitroGLYCERIN (NITROSTAT) 0.4 MG SL tablet Place 0.4 mg under the tongue every 5 (five) minutes as  needed for chest pain.   Yes [provider]  ondansetron (ZOFRAN) 4 MG tablet Take 1 tablet (4 mg total) by mouth every 6 (six) hours as needed for nausea. 11/30/17  Yes Emokpae, Courage, MD  pantoprazole sodium (PROTONIX) 40 mg/20 mL PACK Take 20 mLs (40 mg total) by mouth daily at 12 noon. 12/01/17  Yes Emokpae, Courage, MD  polyethylene glycol (MIRALAX / GLYCOLAX) packet Take 17 g by mouth daily as needed for mild constipation or moderate constipation.   Yes [provider]  Vitamin D, Ergocalciferol, (DRISDOL) 50000 units CAPS capsule Take 50,000 Units by mouth every 30 (thirty) days. Takes 17th of each Month   Yes [provider]  insulin aspart (NOVOLOG) 100 UNIT/ML injection Inject 0-9 Units into the skin 3 (three) times daily with meals. Patient not taking: Reported on 01/24/2018 11/30/17   [name], MD  linezolid (ZYVOX) 600 MG tablet Take 1 tablet (600 mg total) by mouth every 12 (twelve) hours. Patient not taking: Reported on 01/24/2018 11/30/17   [name], MD  Maltodextrin-Xanthan Gum (RESOURCE THICKENUP CLEAR) POWD Take 120 g by mouth as needed. Patient not taking: Reported on 01/24/2018 11/30/17   [name], MD    Family History No family history on file.  Social History Social History   Tobacco Use  . Smoking status: Former Smoker    Types: Cigarettes  . Smokeless tobacco: Never Used  Substance Use Topics  . Alcohol use: Not Currently  . Drug use: Not Currently     Allergies   Asa [aspirin]; Eggs or egg-derived products; Fish allergy; and Motrin [ibuprofen]   Review of Systems Review of Systems  Constitutional: Positive for fatigue.  Respiratory: Positive for shortness of breath.   Psychiatric/Behavioral: The patient is nervous/anxious.   All other systems reviewed and are negative.    Physical Exam Updated Vital Signs BP 128/78   Pulse 85   Temp 98.1 F (36.7 C) (Oral)   Resp (!) 34   Ht 5\' 6"  (1.676 m)   Wt  89.4 kg (197 lb)   SpO2 98%   BMI 31.80 kg/m   Physical Exam  Constitutional: She is oriented to person, place, and time. She appears well-developed and well-nourished. No distress.  HENT:  Head: Normocephalic and atraumatic.  Eyes: Conjunctivae are normal.  Neck: Neck supple.  Cardiovascular: Normal rate, regular rhythm and normal heart sounds. Exam reveals no friction rub.  No murmur heard. Pulmonary/Chest: Effort normal and breath sounds normal. No respiratory distress. She has no wheezes. She has no rales.  Abdominal: She exhibits no distension.  Musculoskeletal: She exhibits no edema.  Neurological: She is alert and oriented to person, place, and time. She exhibits normal muscle tone.  Skin: Skin is warm. Capillary refill takes less than 2 seconds.  Psychiatric: She has a normal mood and affect.  Nursing note and vitals reviewed.    ED Treatments / Results  Labs (all labs ordered are listed, but only abnormal results are displayed) Labs Reviewed - No data to display  EKG EKG Interpretation  Date/Time:  Monday January 24 2018 00:07:17 EDT Ventricular Rate:  91 PR Interval:    QRS Duration: 80 QT Interval:  378 QTC Calculation: 466 R Axis:     Text Interpretation:  Sinus rhythm No significant change since last tracing Confirmed by Shaune PollackIsaacs, Gurleen Larrivee [id number]) on 01/24/2018 1:18:21 AM   Radiology Dg Chest 2 View  Result Date: 01/24/2018 CLINICAL DATA:  Dyspnea EXAM: CHEST - 2 VIEW COMPARISON:  11/24/2017 FINDINGS: Stable cardiomegaly. No aortic aneurysm. Mild central vascular congestion. Left basilar atelectasis noted. No effusion or pneumothorax. No acute nor suspicious osseous abnormality. IMPRESSION: Stable cardiomegaly with central vascular congestion left basilar atelectasis. Electronically Signed   By: Tollie Ethavid  Kwon M.D.   On: 01/24/2018 02:08    Procedures Procedures (including critical care time)  Medications Ordered in ED Medications  hydrogen peroxide 3 % external  solution (has no administration in time range)  lidocaine (XYLOCAINE) 2 % jelly 1 application (1 application Topical Given by Other 01/24/18 0036)     Initial Impression / Assessment and Plan / ED Course  I have reviewed the triage vital signs and the nursing notes.  Pertinent labs & imaging results that were available during my care of the patient were reviewed by me and considered in my medical decision making (see chart for details).     62 yo F here with trach dislodgment at facility. Unfortunately, by time of arrival here stoma appears to have closed and myself, RT unable to pass trach or bougie. While pt appears to be comfortable currently, I called her facility who report that pt had severe SOB, coughing once her trach was removed and states she's had difficulty clearing secretions. Will consult ENT.  Dr. Annalee GentaShoemaker has seen pt and replaced with 4-0 trach. Pt remains stable, satting well on her basleine O2. CXR is without aspiration or PNA. Will d/c back to facility. She is otherwise without complaints and at her mental baseline.  Final Clinical Impressions(s) / ED Diagnoses   Final diagnoses:  Tracheostomy, acute management [facility](HCC)    ED Discharge Orders    None       Shaune PollackIsaacs, Raeya Merritts, MD 01/24/18 [phone]

## 2018-01-24 ENCOUNTER — Emergency Department (HOSPITAL_COMMUNITY): Payer: Medicaid Other

## 2018-01-24 ENCOUNTER — Other Ambulatory Visit: Payer: Self-pay

## 2018-01-24 ENCOUNTER — Encounter (HOSPITAL_COMMUNITY): Payer: Self-pay

## 2018-01-24 MED ORDER — HYDROGEN PEROXIDE 3 % EX SOLN
CUTANEOUS | Status: AC
  Filled 2018-01-24: qty 473

## 2018-01-24 MED ORDER — LIDOCAINE HCL URETHRAL/MUCOSAL 2 % EX GEL
1.0000 "application " | Freq: Once | CUTANEOUS | Status: AC
  Administered 2018-01-24: 1 via TOPICAL
  Filled 2018-01-24: qty 15

## 2018-01-24 NOTE — ED Notes (Signed)
Pt unable to sign for discharge 

## 2018-01-24 NOTE — Progress Notes (Signed)
Attempted to dilate stoma after using lidocaine jelly to reinsert trach.  This writer was unable to dilate stoma opening wide enough to insert trach.  According to patient, trach has been out since 1400 01/23/2018.  Pt. Is in no distress at this time with [place] in place and saturations 97%.  Patient offers no complaints at this time.   MD notified of above.

## 2018-01-24 NOTE — ED Triage Notes (Signed)
[facility] due to complaints of shortness of breath. Per EMS, the nursing facility wanted to see how well the pt did on a nasal cannula vs her trach. Pt was placed on a [place] at 2 L/min and starting to c/o shortness of breath. Per EMS, pt also appeared to be anxious upon arrival. Facility did not replace trach.

## 2018-01-24 NOTE — ED Notes (Signed)
[facility] was called for transport 

## 2018-01-24 NOTE — Discharge Instructions (Addendum)
[name] was seen by Dr. Annalee GentaShoemaker with ENT and had her trach replaced with a 4-0 cuffless trach. Please follow-up with her primary pulmonologist to determine whether she can be decannulated.

## 2018-01-24 NOTE — Consult Note (Signed)
ENT CONSULT:  Reason for Consult: Dislodged tracheostomy tube Referring Physician: EDP  Grace LefevreGwendolyn [name] is an 62 y.o. female.  HPI: Patient presents to the was a long hospital emergency department with concerns at her nursing care facility regarding dislodged tracheostomy tube.  The patient underwent percutaneous tracheostomy performed by critical care medicine during the hospitalization in March 2019 with failure to wean and respiratory failure after acute hypoxic injury.  The patient was stable with a #6 non-cuffed Shiley tracheostomy tube in place.  She was discharged from the hospital and was in a nursing care facility.  According to her facility and the patient herself tracheostomy dislodged midday on 01/23/2018.  She was brought to was a long emergency department with coughing and difficulty handling her secretions.  ER physician and respiratory therapy staff attempted to replace her tracheostomy tube but were unable to adequately place an ENT consult was called.  Past Medical History:  Diagnosis Date  . Hypertension   . Stroke [facility](HCC)    Left sided weakness     History reviewed. No pertinent surgical history.  No family history on file.  Social History:  reports that she has quit smoking. Her smoking use included cigarettes. She has never used smokeless tobacco. She reports that she drank alcohol. She reports that she has current or past drug history.  Allergies:  Allergies  Allergen Reactions  . Asa [Aspirin]     Unknown reaction per MAR  . Eggs Or Egg-Derived Products     Unknown reaction per MAR  . Fish Allergy     Unknown reaction per MAR  . Motrin [Ibuprofen]     Unknown reaction per MAR    Medications: I have reviewed the patient's current medications.  No results found for this or any previous visit (from the past 48 hour(s)).  Dg Chest 2 View  Result Date: 01/24/2018 CLINICAL DATA:  Dyspnea EXAM: CHEST - 2 VIEW COMPARISON:  11/24/2017 FINDINGS: Stable  cardiomegaly. No aortic aneurysm. Mild central vascular congestion. Left basilar atelectasis noted. No effusion or pneumothorax. No acute nor suspicious osseous abnormality. IMPRESSION: Stable cardiomegaly with central vascular congestion left basilar atelectasis. Electronically Signed   By: Tollie Ethavid  Kwon M.D.   On: 01/24/2018 02:08    ROS:ROS    Blood pressure 128/78, pulse 85, temperature 98.1 F (36.7 C), temperature source Oral, resp. rate (!) 34, height 5\' 6"  (1.676 m), weight 89.4 kg (197 lb), SpO2 98 %.  PHYSICAL EXAM: General appearance - The patient is alert and arousable but has difficulty with speech after her neurologic injury. Neck - supple, no significant adenopathy, patient's trachea stoma has nearly closed with some granulation tissue, no evidence of air leak or stomal passage.  Procedure: Tracheostomy tube insertion Patient stoma was anesthetized with topical lidocaine jelly, inner cannula was carefully inserted through her nearly closed stoma with placement in the airway.  Patient stable with out significant bleeding or concern.  #4 uncuffed Shiley tracheostomy tube was then inserted with moderate pressure, patient tolerated procedure.  Airway stable and intact.  No bleeding or complication.  Studies Reviewed:None  Assessment/Plan: Patient presents for dislodged tracheostomy tube and a chronic nursing care facility tracheostomy dependent neurologically impaired patient.  A #4 Shiley non-cuffed tracheostomy tube was inserted and the patient's airway was stable with good oxygenation and no complication.  Patient stable, she is return to her nursing care facility for long-term care.  Plan follow-up on as-needed basis.  Yanai Hobson 01/24/2018, 2:25 AM

## 2018-01-24 NOTE — ED Notes (Signed)
RT at bedside for trach care.

## 2018-03-09 ENCOUNTER — Emergency Department (HOSPITAL_COMMUNITY)
Admission: EM | Admit: 2018-03-09 | Discharge: 2018-03-10 | Disposition: A | Discharge status: Home / Self Care | Payer: Medicaid Other | Attending: Emergency Medicine | Admitting: Emergency Medicine

## 2018-03-09 ENCOUNTER — Other Ambulatory Visit: Payer: Self-pay

## 2018-03-09 ENCOUNTER — Encounter (HOSPITAL_COMMUNITY): Payer: Self-pay

## 2018-03-09 DIAGNOSIS — Z79899 Other long term (current) drug therapy: Secondary | ICD-10-CM | POA: Diagnosis not present

## 2018-03-09 DIAGNOSIS — E119 Type 2 diabetes mellitus without complications: Secondary | ICD-10-CM | POA: Diagnosis not present

## 2018-03-09 DIAGNOSIS — Z794 Long term (current) use of insulin: Secondary | ICD-10-CM | POA: Diagnosis not present

## 2018-03-09 DIAGNOSIS — Z87891 Personal history of nicotine dependence: Secondary | ICD-10-CM | POA: Insufficient documentation

## 2018-03-09 DIAGNOSIS — I10 Essential (primary) hypertension: Secondary | ICD-10-CM | POA: Diagnosis not present

## 2018-03-09 DIAGNOSIS — J9503 Malfunction of tracheostomy stoma: Secondary | ICD-10-CM | POA: Diagnosis present

## 2018-03-09 NOTE — ED Notes (Signed)
[facility] called for transport.  

## 2018-03-09 NOTE — Consult Note (Signed)
ENT CONSULT:  Reason for Consult: Dislodged tracheostomy tube Referring Physician:  EDP  Grace [name] is an 62 y.o. female.  HPI: The patient presents to [facility] ER for evaluation after dislodging her tracheostomy tube.  The patient underwent tracheostomy in March 2019 for chronic respiratory dysfunction, tracheostomy performed by critical care medicine.  Patient was seen in June 2019 with dislodged tracheostomy tube which was reinserted in the emergency room without complication.  Patient reports she is stable using Passy-Muir valve.  Grace [name] was dislodged earlier today and she was transferred to the hospital for further evaluation.  No respiratory distress or cough.  She is saturating at 100% on nasal cannula oxygen.  Past Medical History:  Diagnosis Date  . Hypertension   . Stroke [facility](HCC)    Left sided weakness     History reviewed. No pertinent surgical history.  History reviewed. No pertinent family history.  Social History:  reports that she has quit smoking. Her smoking use included cigarettes. She has never used smokeless tobacco. She reports that she drank alcohol. She reports that she has current or past drug history.  Allergies:  Allergies  Allergen Reactions  . Asa [Aspirin]     Unknown reaction per MAR  . Eggs Or Egg-Derived Products     Unknown reaction per MAR  . Fish Allergy     Unknown reaction per MAR  . Motrin [Ibuprofen]     Unknown reaction per MAR    Medications: I have reviewed the patient's current medications.  No results found for this or any previous visit (from the past 48 hour(s)).  No results found.  ROS:ROS  Blood pressure (!) 152/77, pulse 78, temperature 97.9 F (36.6 C), temperature source Oral, resp. rate 18, SpO2 100 %.  PHYSICAL EXAM: General appearance -patient is alert and interactive, good voice.  No distress. Neck -trachea stoma shows minimal granulation tissue, narrow tract.  Patient's trachea stomal track is  dilated with the 4 Shiley introducer and then the 4 Shiley tracheostomy tube was inserted without significant difficulty.  Patient airway stable, good respiration minimal cough.  Studies Reviewed:none  Assessment/Plan: Patient presents to [facility] for evaluation of dislodged tracheostomy tube.  #4 Shiley uncuffed tracheostomy tube inserted at the bedside without difficulty, patient's airway stable.  It is unclear the patient needs long-term tracheostomy placement, question whether she could be decannulated at her nursing care facility.  It is also unclear whether the patient is getting routine tracheostomy tube changes.  If she needs a long-term tracheostomy she would benefit from referral to the [facility] for long-term trach changes and trach care.  This could be facilitated through her nursing care facility.  Grace [name] 03/09/2018, 5:21 PM

## 2018-03-09 NOTE — ED Provider Notes (Signed)
[facility]-EMERGENCY DEPT Provider Note   CSN: [id number] Arrival date & time: 03/09/18  1517     History   Chief Complaint Chief Complaint  Patient presents with  . Tracheostomy Tube Change    HPI Grace [name] is a 62 y.o. female.  The history is provided by the patient and medical records. The history is limited by the condition of the patient. No language interpreter was used.  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 The current episode started more than 1 week ago. The problem occurs constantly. The problem has not changed since onset.Pertinent negatives include no chest pain, no abdominal pain, no headaches and no shortness of breath. Nothing aggravates the symptoms. Nothing relieves the symptoms. She has tried nothing for the symptoms. The treatment provided no relief.    Past Medical History:  Diagnosis Date  . Hypertension   . Stroke [facility])    Left sided weakness     Patient Active Problem List   Diagnosis Date Noted  . Tracheostomy status (HCC)   . Acute encephalopathy   . Acute respiratory failure with hypoxemia (HCC)   . Hypertensive emergency   . Hypoxia   . Sepsis (HCC) 11/07/2017    History reviewed. No pertinent surgical history.   OB History   None      Home Medications    Prior to Admission medications   Medication Sig Start Date End Date Taking? Authorizing Provider  acetaminophen (TYLENOL) 325 MG tablet Take 650 mg by mouth 3 (three) times daily.     [provider]  acetaminophen (TYLENOL) 325 MG tablet Take 650 mg by mouth every 6 (six) hours as needed for mild pain, moderate pain, fever or headache.    [provider]  alendronate (FOSAMAX) 70 MG tablet Take 70 mg by mouth every Wednesday. Take with a full glass of water on an empty stomach.     [provider]  amLODipine (NORVASC) 10 MG tablet Take 1 tablet (10 mg total) by mouth daily. 12/01/17    [name], MD  atorvastatin (LIPITOR) 10 MG tablet Take 30 mg by mouth at bedtime.    [provider]  calcium-vitamin D (OSCAL WITH D) 500-200 MG-UNIT tablet Take 1 tablet by mouth 2 (two) times daily.    [provider]  cloNIDine (CATAPRES) 0.2 MG tablet Take 1 tablet (0.2 mg total) by mouth 3 (three) times daily. 11/30/17   [name], MD  clopidogrel (PLAVIX) 75 MG tablet Take 75 mg by mouth daily.    [provider]  DULoxetine (CYMBALTA) 60 MG capsule Take 60 mg by mouth daily.    [provider]  ferrous sulfate 325 (65 FE) MG EC tablet Take 325 mg by mouth at bedtime.    [provider]  gabapentin (NEURONTIN) 300 MG capsule Take 600 mg by mouth at bedtime.     [provider]  guaiFENesin (ROBITUSSIN) 100 MG/5ML SOLN Take 15 mLs by mouth every 4 (four) hours as needed for cough or to loosen phlegm.    [provider]  hydrALAZINE (APRESOLINE) 100 MG tablet Take 1 tablet (100 mg total) by mouth 3 (three) times daily. 11/30/17   [name], MD  insulin aspart (NOVOLOG) 100 UNIT/ML injection Inject 0-9 Units into the skin 3 (three) times daily with meals. Patient not taking: Reported on 01/24/2018 11/30/17   [name], MD  insulin glargine (LANTUS) 100 UNIT/ML injection Inject 0.25 mLs (25  Units total) into the skin daily. 12/01/17   [name], MD  insulin lispro (HUMALOG) 100 UNIT/ML injection Inject 2-8 Units into the skin 2 (two) times daily. 201-250= 2 units 251-300= 4 units 301-350- 6 units 351-400= 8 units > 400 Call MD    [provider]  isosorbide mononitrate (IMDUR) 30 MG 24 hr tablet Take 1 tablet (30 mg total) by mouth daily. 11/30/17 11/30/18  [name], MD  labetalol (NORMODYNE) 200 MG tablet Take 1 tablet (200 mg total) by mouth 3 (three) times daily. 11/30/17   [name], MD  linezolid (ZYVOX) 600 MG tablet Take 1 tablet (600 mg total) by mouth every 12 (twelve)  hours. Patient not taking: Reported on 01/24/2018 11/30/17   [name], MD  Maltodextrin-Xanthan Gum (RESOURCE THICKENUP CLEAR) POWD Take 120 g by mouth as needed. Patient not taking: Reported on 01/24/2018 11/30/17   [name], MD  montelukast (SINGULAIR) 10 MG tablet Take 10 mg by mouth at bedtime.    [provider]  nitroGLYCERIN (NITROSTAT) 0.4 MG SL tablet Place 0.4 mg under the tongue every 5 (five) minutes as needed for chest pain.    [provider]  ondansetron (ZOFRAN) 4 MG tablet Take 1 tablet (4 mg total) by mouth every 6 (six) hours as needed for nausea. 11/30/17   [name], MD  pantoprazole sodium (PROTONIX) 40 mg/20 mL PACK Take 20 mLs (40 mg total) by mouth daily at 12 noon. 12/01/17   [name], MD  polyethylene glycol (MIRALAX / GLYCOLAX) packet Take 17 g by mouth daily as needed for mild constipation or moderate constipation.    [provider]  Vitamin D, Ergocalciferol, (DRISDOL) 50000 units CAPS capsule Take 50,000 Units by mouth every 30 (thirty) days. Takes 17th of each Month    [provider]    Family History History reviewed. No pertinent family history.  Social History Social History   Tobacco Use  . Smoking status: Former Smoker    Types: Cigarettes  . Smokeless tobacco: Never Used  Substance Use Topics  . Alcohol use: Not Currently  . Drug use: Not Currently     Allergies   Asa [aspirin]; Eggs or egg-derived products; Fish allergy; and Motrin [ibuprofen]   Review of Systems Review of Systems  Constitutional: Negative for chills and fever.  HENT: Negative for congestion, ear pain and sore throat.   Eyes: Negative for pain and visual disturbance.  Respiratory: Negative for cough, choking, chest tightness, shortness of breath, wheezing and stridor.   Cardiovascular: Negative for chest pain and palpitations.  Gastrointestinal: Negative for abdominal pain and vomiting.  Genitourinary:  Negative for dysuria, flank pain and hematuria.  Musculoskeletal: Positive for neck pain (mild). Negative for arthralgias, back pain and neck stiffness.  Skin: Positive for wound. Negative for color change and rash.  Neurological: Negative for seizures, syncope, light-headedness and headaches.  All other systems reviewed and are negative.    Physical Exam Updated Vital Signs BP (!) 146/74   Pulse 88   Temp 97.9 F (36.6 C) (Oral)   Resp 18   SpO2 99%   Physical Exam  Constitutional: She is oriented to person, place, and time. She appears well-developed and well-nourished. No distress.  HENT:  Head: Normocephalic and atraumatic.  Nose: Nose normal.  Mouth/Throat: Oropharynx is clear and moist. No oropharyngeal exudate.  Eyes: Pupils are equal, round, and reactive to light. Conjunctivae and EOM are normal.  Neck: Normal range of motion. Neck supple.  Cardiovascular: Normal rate and regular rhythm.  No murmur heard. Pulmonary/Chest: Effort normal and breath sounds normal. No respiratory distress. She has no wheezes. She exhibits no tenderness.  Abdominal: Soft. There is no tenderness. There is no guarding.  Musculoskeletal: She exhibits no edema or tenderness.  Lymphadenopathy:    She has no cervical adenopathy.  Neurological: She is alert and oriented to person, place, and time. No cranial nerve deficit or sensory deficit. She exhibits normal muscle tone.  Skin: Skin is warm and dry. Capillary refill takes less than 2 seconds. No rash noted. She is not diaphoretic. No erythema.  Psychiatric: She has a normal mood and affect.  Nursing note and vitals reviewed.    ED Treatments / Results  Labs (all labs ordered are listed, but only abnormal results are displayed) Labs Reviewed - No data to display  EKG None  Radiology No results found.  Procedures Procedures (including critical care time)  Medications Ordered in ED Medications - No data to display   Initial  Impression / Assessment and Plan / ED Course  I have reviewed the triage vital signs and the nursing notes.  Pertinent labs & imaging results that were available during my care of the patient were reviewed by me and considered in my medical decision making (see chart for details).     Jacalyn LefevreGwendolyn Pecora is a 62 y.o. female with a past medical history significant for hypertension, prior stroke, and respiratory failure with subsequent trach back in March 2019 by critical care medicine who presents with trach dislodgment.  Patient reports that today at [facility], her [facility]ach fell out.  She reports no significant shortness of breath or difficulty swallowing.  She denies any chest pain or chest tightness.  She does report some discomfort in her neck near the trach site after replacement was attempted.  She denies any preceding symptoms.  Next  On exam, patient has a small tracheostomy site with no tracheostomy tube in place.  Patient had no significant stridor.  Lungs were clear and chest was nontender.  Patient on nasal cannula oxygen and maintaining her oxygen saturations.  Patient is resting comfortably.    Respiratory therapy in the emergency department attempted to replace it several times without success.  A 4-0 uncuffed Shiley was attempted.  After replacement failure, ENT was called for replacement.  Dr. Annalee GentaShoemaker will come see the patient and help attempt replacement.  8:16 PM ENT, Dr. Annalee GentaShoemaker, came and replaced the patient's trach.  Patient had some bleeding following the procedure.  Patient was observed for period of time and risk for re-suction her trach.  Patient will be observed to make sure she does not have further bleeding or difficulty breathing.  Anticipate discharge back to her facility.  Patient tolerated p.o. well and had cessation of trach bleeding.  Patient resting comfortable.    Patient be discharged back to her facility.    Final Clinical Impressions(s) /  ED Diagnoses   Final diagnoses:  Tracheostomy malfunction [facility](HCC)    ED Discharge Orders    None      Clinical Impression: 1. Tracheostomy malfunction (HCC)     Disposition: Discharge  Condition: Good  I have discussed the results, Dx and Tx plan with the pt(& family if present). He/she/they expressed understanding and agree(s) with the plan. Discharge instructions discussed at great length. Strict return precautions discussed and pt &/or family have verbalized understanding of the instructions. No further questions at time of discharge.    New  Prescriptions   No medications on file    Follow Up: Justin Mend, MD [address] [place] [address] [phone]     [facility] [address] [id number] mc [place] [address] [phone]       Auburn Hester, Canary Brim, MD 03/09/18 2128

## 2018-03-09 NOTE — ED Notes (Signed)
Bed: [id number] Expected date:  Expected time:  Means of arrival:  Comments: EMS/Trach. out

## 2018-03-09 NOTE — Discharge Instructions (Signed)
Your tracheostomy tube was replaced today by the ENT team.  We watched you for period of time and made sure the bleeding improved.  Please follow-up with your PCP and ENT for further management.  If any symptoms change or worsen, please return to the nearest emergency department.

## 2018-03-09 NOTE — Progress Notes (Signed)
Rt called to room WA10 for trach being out. Rt tried to place trach back into stoma which is almost completely closed. No distress noted at this time. RN at bedside. Pt stated she has a #4 Shiley.

## 2018-03-09 NOTE — ED Triage Notes (Signed)
EMS reports from [facility], reports Grace [name] spontaneously came out, size 4 uncuffed. Staff made several attempts to reinsert without success. Pt denies pain or distress. 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  BP 128/72 HR 78 Resp 18 Sp02 97 2lts CBG 168

## 2018-03-10 NOTE — ED Notes (Signed)
PTAR at bedside for transport.  

## 2018-06-18 ENCOUNTER — Other Ambulatory Visit: Payer: Self-pay

## 2018-06-18 ENCOUNTER — Encounter (HOSPITAL_COMMUNITY): Payer: Self-pay | Admitting: Emergency Medicine

## 2018-06-18 ENCOUNTER — Inpatient Hospital Stay (HOSPITAL_COMMUNITY)
Admission: EM | Admit: 2018-06-18 | Discharge: 2018-06-21 | DRG: 208 | Disposition: A | Discharge status: Skilled Nursing Facility | Payer: Medicaid Other | Source: Skilled Nursing Facility | Attending: Pulmonary Disease | Admitting: Pulmonary Disease

## 2018-06-18 ENCOUNTER — Emergency Department (HOSPITAL_COMMUNITY): Payer: Medicaid Other

## 2018-06-18 DIAGNOSIS — Z79899 Other long term (current) drug therapy: Secondary | ICD-10-CM | POA: Diagnosis not present

## 2018-06-18 DIAGNOSIS — I10 Essential (primary) hypertension: Secondary | ICD-10-CM | POA: Diagnosis present

## 2018-06-18 DIAGNOSIS — Z87891 Personal history of nicotine dependence: Secondary | ICD-10-CM

## 2018-06-18 DIAGNOSIS — Z91012 Allergy to eggs: Secondary | ICD-10-CM

## 2018-06-18 DIAGNOSIS — Z794 Long term (current) use of insulin: Secondary | ICD-10-CM

## 2018-06-18 DIAGNOSIS — Z7902 Long term (current) use of antithrombotics/antiplatelets: Secondary | ICD-10-CM

## 2018-06-18 DIAGNOSIS — I69391 Dysphagia following cerebral infarction: Secondary | ICD-10-CM | POA: Diagnosis not present

## 2018-06-18 DIAGNOSIS — I69354 Hemiplegia and hemiparesis following cerebral infarction affecting left non-dominant side: Secondary | ICD-10-CM

## 2018-06-18 DIAGNOSIS — E872 Acidosis: Secondary | ICD-10-CM | POA: Diagnosis present

## 2018-06-18 DIAGNOSIS — E119 Type 2 diabetes mellitus without complications: Secondary | ICD-10-CM | POA: Diagnosis present

## 2018-06-18 DIAGNOSIS — R1310 Dysphagia, unspecified: Secondary | ICD-10-CM | POA: Diagnosis present

## 2018-06-18 DIAGNOSIS — J9602 Acute respiratory failure with hypercapnia: Secondary | ICD-10-CM | POA: Diagnosis present

## 2018-06-18 DIAGNOSIS — Z91013 Allergy to seafood: Secondary | ICD-10-CM | POA: Diagnosis not present

## 2018-06-18 DIAGNOSIS — R0603 Acute respiratory distress: Secondary | ICD-10-CM

## 2018-06-18 DIAGNOSIS — J9600 Acute respiratory failure, unspecified whether with hypoxia or hypercapnia: Secondary | ICD-10-CM | POA: Diagnosis present

## 2018-06-18 DIAGNOSIS — J9601 Acute respiratory failure with hypoxia: Secondary | ICD-10-CM

## 2018-06-18 DIAGNOSIS — G9340 Encephalopathy, unspecified: Secondary | ICD-10-CM | POA: Diagnosis not present

## 2018-06-18 DIAGNOSIS — Z888 Allergy status to other drugs, medicaments and biological substances status: Secondary | ICD-10-CM

## 2018-06-18 DIAGNOSIS — Z930 Tracheostomy status: Secondary | ICD-10-CM | POA: Diagnosis not present

## 2018-06-18 DIAGNOSIS — J9621 Acute and chronic respiratory failure with hypoxia: Secondary | ICD-10-CM | POA: Diagnosis present

## 2018-06-18 DIAGNOSIS — Z430 Encounter for attention to tracheostomy: Secondary | ICD-10-CM | POA: Diagnosis not present

## 2018-06-18 HISTORY — DX: Acute respiratory failure, unspecified whether with hypoxia or hypercapnia: J96.00

## 2018-06-18 HISTORY — DX: Encephalopathy, unspecified: G93.40

## 2018-06-18 HISTORY — DX: Sepsis, unspecified organism: A41.9

## 2018-06-18 HISTORY — DX: Other specified postprocedural states: Z98.890

## 2018-06-18 HISTORY — DX: Dysphagia, unspecified: R13.10

## 2018-06-18 LAB — CBC WITH DIFFERENTIAL/PLATELET
Abs Immature Granulocytes: 0.06 10*3/uL (ref 0.00–0.07)
BASOS ABS: 0.1 10*3/uL (ref 0.0–0.1)
Basophils Relative: 1 %
EOS ABS: 0.1 10*3/uL (ref 0.0–0.5)
Eosinophils Relative: 1 %
HEMATOCRIT: 44.7 % (ref 36.0–46.0)
Hemoglobin: 12.8 g/dL (ref 12.0–15.0)
Immature Granulocytes: 1 %
LYMPHS ABS: 5.8 10*3/uL — AB (ref 0.7–4.0)
Lymphocytes Relative: 52 %
MCH: 24.9 pg — AB (ref 26.0–34.0)
MCHC: 28.6 g/dL — AB (ref 30.0–36.0)
MCV: 87 fL (ref 80.0–100.0)
MONOS PCT: 10 %
Monocytes Absolute: 1 10*3/uL (ref 0.1–1.0)
NRBC: 0 % (ref 0.0–0.2)
Neutro Abs: 3.8 10*3/uL (ref 1.7–7.7)
Neutrophils Relative %: 35 %
Platelets: 468 10*3/uL — ABNORMAL HIGH (ref 150–400)
RBC: 5.14 MIL/uL — ABNORMAL HIGH (ref 3.87–5.11)
RDW: 17.9 % — AB (ref 11.5–15.5)
WBC: 10.8 10*3/uL — ABNORMAL HIGH (ref 4.0–10.5)

## 2018-06-18 LAB — TROPONIN I: TROPONIN I: 0.04 ng/mL — AB (ref <0.03)

## 2018-06-18 LAB — PROTIME-INR
INR: 0.9
Prothrombin Time: 12 seconds (ref 11.4–15.2)

## 2018-06-18 LAB — I-STAT ARTERIAL BLOOD GAS, ED
Acid-Base Excess: 9 mmol/L — ABNORMAL HIGH (ref 0.0–2.0)
BICARBONATE: 36.2 mmol/L — AB (ref 20.0–28.0)
O2 Saturation: 94 %
PCO2 ART: 62.2 mmHg — AB (ref 32.0–48.0)
PO2 ART: 78 mmHg — AB (ref 83.0–108.0)
Patient temperature: 98.6
TCO2: 38 mmol/L — AB (ref 22–32)
pH, Arterial: 7.372 (ref 7.350–7.450)

## 2018-06-18 LAB — COMPREHENSIVE METABOLIC PANEL
ALK PHOS: 92 U/L (ref 38–126)
ALT: 20 U/L (ref 0–44)
AST: 28 U/L (ref 15–41)
Albumin: 3.3 g/dL — ABNORMAL LOW (ref 3.5–5.0)
Anion gap: 5 (ref 5–15)
BILIRUBIN TOTAL: 0.5 mg/dL (ref 0.3–1.2)
BUN: 14 mg/dL (ref 8–23)
CALCIUM: 9.8 mg/dL (ref 8.9–10.3)
CO2: 32 mmol/L (ref 22–32)
Chloride: 102 mmol/L (ref 98–111)
Creatinine, Ser: 0.87 mg/dL (ref 0.44–1.00)
GFR calc Af Amer: >60 mL/min (ref >60)
GFR calc non Af Amer: >60 mL/min (ref >60)
Glucose, Bld: 133 mg/dL — ABNORMAL HIGH (ref 70–99)
Potassium: 5.4 mmol/L — ABNORMAL HIGH (ref 3.5–5.1)
Sodium: 139 mmol/L (ref 135–145)
TOTAL PROTEIN: 7.6 g/dL (ref 6.5–8.1)

## 2018-06-18 LAB — I-STAT CHEM 8, ED
BUN: 16 mg/dL (ref 8–23)
CREATININE: 1 mg/dL (ref 0.44–1.00)
Calcium, Ion: 1.13 mmol/L — ABNORMAL LOW (ref 1.15–1.40)
Chloride: 101 mmol/L (ref 98–111)
Glucose, Bld: 174 mg/dL — ABNORMAL HIGH (ref 70–99)
HCT: 45 % (ref 36.0–46.0)
HEMOGLOBIN: 15.3 g/dL — AB (ref 12.0–15.0)
Potassium: 5.3 mmol/L — ABNORMAL HIGH (ref 3.5–5.1)
Sodium: 140 mmol/L (ref 135–145)
TCO2: 36 mmol/L — AB (ref 22–32)

## 2018-06-18 LAB — I-STAT VENOUS BLOOD GAS, ED
ACID-BASE EXCESS: 8 mmol/L — AB (ref 0.0–2.0)
Bicarbonate: 36.7 mmol/L — ABNORMAL HIGH (ref 20.0–28.0)
O2 SAT: 99 %
TCO2: 39 mmol/L — ABNORMAL HIGH (ref 22–32)
pCO2, Ven: 70.7 mmHg (ref 44.0–60.0)
pH, Ven: 7.323 (ref 7.250–7.430)
pO2, Ven: 155 mmHg — ABNORMAL HIGH (ref 32.0–45.0)

## 2018-06-18 LAB — GLUCOSE, CAPILLARY
Glucose-Capillary: 120 mg/dL — ABNORMAL HIGH (ref 70–99)
Glucose-Capillary: 161 mg/dL — ABNORMAL HIGH (ref 70–99)
Glucose-Capillary: 117 mg/dL — ABNORMAL HIGH (ref 70–99)

## 2018-06-18 LAB — PHOSPHORUS: Phosphorus: 4.6 mg/dL (ref 2.5–4.6)

## 2018-06-18 LAB — BRAIN NATRIURETIC PEPTIDE: B NATRIURETIC PEPTIDE 5: 60.3 pg/mL (ref 0.0–100.0)

## 2018-06-18 LAB — MRSA PCR SCREENING: MRSA by PCR: NEGATIVE

## 2018-06-18 LAB — MAGNESIUM: Magnesium: 1.9 mg/dL (ref 1.7–2.4)

## 2018-06-18 MED ORDER — FENTANYL CITRATE (PF) 100 MCG/2ML IJ SOLN: 100.0000 ug | INTRAMUSCULAR | Status: DC | PRN

## 2018-06-18 MED ORDER — INSULIN ASPART 100 UNIT/ML ~~LOC~~ SOLN
0.0000 [IU] | SUBCUTANEOUS | Status: DC
  Administered 2018-06-18 – 2018-06-19 (×2): 3 [IU] via SUBCUTANEOUS
  Administered 2018-06-19 (×2): 2 [IU] via SUBCUTANEOUS
  Administered 2018-06-20: 5 [IU] via SUBCUTANEOUS
  Administered 2018-06-20: 2 [IU] via SUBCUTANEOUS
  Administered 2018-06-20 – 2018-06-21 (×2): 3 [IU] via SUBCUTANEOUS

## 2018-06-18 MED ORDER — CLONIDINE HCL 0.2 MG PO TABS
0.2000 mg | ORAL_TABLET | Freq: Two times a day (BID) | ORAL | Status: DC
  Filled 2018-06-18 (×2): qty 1

## 2018-06-18 MED ORDER — ORAL CARE MOUTH RINSE
15.0000 mL | Freq: Two times a day (BID) | OROMUCOSAL | Status: DC
  Administered 2018-06-19 – 2018-06-20 (×4): 15 mL via OROMUCOSAL

## 2018-06-18 MED ORDER — HEPARIN SODIUM (PORCINE) 5000 UNIT/ML IJ SOLN
5000.0000 [IU] | Freq: Three times a day (TID) | INTRAMUSCULAR | Status: DC
Start: 2018-06-18 — End: 2018-06-21
  Administered 2018-06-18 – 2018-06-20 (×8): 5000 [IU] via SUBCUTANEOUS
  Filled 2018-06-18 (×8): qty 1

## 2018-06-18 MED ORDER — ONDANSETRON HCL 4 MG/2ML IJ SOLN: 4.0000 mg | Freq: Four times a day (QID) | INTRAMUSCULAR | Status: DC | PRN

## 2018-06-18 MED ORDER — ATORVASTATIN CALCIUM 20 MG PO TABS
10.0000 mg | ORAL_TABLET | Freq: Every day | ORAL | Status: DC
  Administered 2018-06-19 – 2018-06-20 (×2): 10 mg
  Filled 2018-06-18 (×2): qty 1

## 2018-06-18 MED ORDER — DEXMEDETOMIDINE HCL IN NACL 400 MCG/100ML IV SOLN
0.4000 ug/kg/h | INTRAVENOUS | Status: DC
  Administered 2018-06-18 – 2018-06-19 (×3): 0.6 ug/kg/h via INTRAVENOUS
  Filled 2018-06-18 (×2): qty 100

## 2018-06-18 MED ORDER — ORAL CARE MOUTH RINSE
15.0000 mL | OROMUCOSAL | Status: DC
  Administered 2018-06-18: 15 mL via OROMUCOSAL

## 2018-06-18 MED ORDER — SODIUM CHLORIDE 0.9 % IV SOLN: INTRAVENOUS | Status: DC

## 2018-06-18 MED ORDER — CHLORHEXIDINE GLUCONATE 0.12% ORAL RINSE (MEDLINE KIT)
15.0000 mL | Freq: Two times a day (BID) | OROMUCOSAL | Status: DC
  Administered 2018-06-18: 15 mL via OROMUCOSAL

## 2018-06-18 MED ORDER — ALBUTEROL SULFATE (2.5 MG/3ML) 0.083% IN NEBU
2.5000 mg | INHALATION_SOLUTION | RESPIRATORY_TRACT | Status: DC
  Administered 2018-06-18 – 2018-06-20 (×15): 2.5 mg via RESPIRATORY_TRACT
  Filled 2018-06-18 (×14): qty 3

## 2018-06-18 MED ORDER — DEXMEDETOMIDINE HCL IN NACL 200 MCG/50ML IV SOLN: 0.4000 ug/kg/h | INTRAVENOUS | Status: DC

## 2018-06-18 MED ORDER — DEXMEDETOMIDINE HCL IN NACL 200 MCG/50ML IV SOLN
0.4000 ug/kg/h | INTRAVENOUS | Status: DC
Start: 2018-06-18 — End: 2018-06-18
  Administered 2018-06-18: 0.4 ug/kg/h via INTRAVENOUS
  Filled 2018-06-18: qty 50

## 2018-06-18 MED ORDER — ACETAMINOPHEN 325 MG PO TABS: 650.0000 mg | ORAL_TABLET | ORAL | Status: DC | PRN

## 2018-06-18 MED ORDER — DEXTROSE-NACL 5-0.45 % IV SOLN
INTRAVENOUS | Status: DC
  Administered 2018-06-18 – 2018-06-20 (×3): via INTRAVENOUS

## 2018-06-18 MED ORDER — FENTANYL CITRATE (PF) 100 MCG/2ML IJ SOLN
100.0000 ug | INTRAMUSCULAR | Status: DC | PRN
  Administered 2018-06-18: 100 ug via INTRAVENOUS

## 2018-06-18 MED ORDER — PANTOPRAZOLE SODIUM 40 MG IV SOLR
40.0000 mg | Freq: Every day | INTRAVENOUS | Status: DC
Start: 2018-06-18 — End: 2018-06-19
  Administered 2018-06-18: 40 mg via INTRAVENOUS
  Filled 2018-06-18: qty 40

## 2018-06-18 MED ORDER — FENTANYL CITRATE (PF) 100 MCG/2ML IJ SOLN
INTRAMUSCULAR | Status: AC
  Filled 2018-06-18: qty 2

## 2018-06-18 MED ORDER — CHLORHEXIDINE GLUCONATE 0.12 % MT SOLN
15.0000 mL | Freq: Two times a day (BID) | OROMUCOSAL | Status: DC
  Administered 2018-06-19 – 2018-06-20 (×4): 15 mL via OROMUCOSAL
  Filled 2018-06-18 (×4): qty 15

## 2018-06-18 MED ORDER — HYDRALAZINE HCL 20 MG/ML IJ SOLN
5.0000 mg | INTRAMUSCULAR | Status: DC | PRN
  Administered 2018-06-18 – 2018-06-19 (×5): 5 mg via INTRAVENOUS
  Filled 2018-06-18 (×6): qty 1

## 2018-06-18 NOTE — ED Notes (Signed)
Pt resting more comfortably, is awake,

## 2018-06-18 NOTE — Progress Notes (Signed)
CRITICAL VALUE ALERT  Critical Value:  Troponin 0.04  Date & Time Notied:  06/18/18 2130  Provider Notified: Pola Corn  Orders Received/Actions taken:

## 2018-06-18 NOTE — Procedures (Signed)
Emergent Percutaneous Tracheostomy Placement  No consent obtained, emergent.  Patient sedated with minimal fentanyl due to acute respiratory failure.  Placed on 100% FiO2.  Area cleaned.  Small bronchoscope was placed through the stoma and verified in the airway.  Wire placed behind it and visualized in the airway.  Bronchoscope removed and dilator placed over the wire and stoma dilated.  Dilator removed.  Size 6 trach placed over the sequential dilator and placed then wire and sequential dilator removed.  Bronchoscope then used to confirm the location of the trach that was well above the carina with some blood present but no active hemorrhage.  CXR ordered and confirmed location.  Good volume return and color change.  Alyson Reedy, M.D. [facility] Pulmonary/Critical Care Medicine. Pager: [phone]. After hours pager: [phone].

## 2018-06-18 NOTE — Procedures (Signed)
Bronchoscopy Procedure Note Grace [name] [id number] Oct 28, 1955  Procedure: Bronchoscopy Indications: Diagnostic evaluation of the airways and Remove secretions  Procedure Details Consent: Unable to obtain consent because of emergent medical necessity. Time Out: Verified patient identification, verified procedure, site/side was marked, verified correct patient position, special equipment/implants available, medications/allergies/relevent history reviewed, required imaging and test results available.  Performed  In preparation for procedure, patient was given 100% FiO2 and bronchoscope lubricated. Sedation: Fentanyl  Airway entered and the following bronchi were examined: RUL, RML, RLL, LUL, LLL and Bronchi.   Procedure done as a part of emergent tracheostomy placement as well Bronchoscope removed.  , Patient placed back on 100% FiO2 at conclusion of procedure.    Evaluation Hemodynamic Status: BP stable throughout; O2 sats: stable throughout Patient's Current Condition: stable Specimens:  None Complications: No apparent complications Patient did tolerate procedure well.   Ashish Rossetti 06/18/2018

## 2018-06-18 NOTE — ED Triage Notes (Signed)
To ED via [facility] from [facility] with trach tube having fallen out- EMS is bagging pt,  Per Nursing home, trach tube came out, staff attempting to put back in without success- when EMS arrived, pt was apneic, unable to re-insert trach, EMS completely removed it and began bagging- pt then was breathing some independently.

## 2018-06-18 NOTE — ED Provider Notes (Signed)
[facility] EMERGENCY DEPARTMENT Provider Note   CSN: [id number] Arrival date & time: 06/18/18  1301     History   Chief Complaint Chief Complaint  Patient presents with  . Airway Obstruction    HPI Ouita Nish is a 62 y.o. female.  Patient is a 62 year old female with a history of acute respiratory failure with hypoxemia, stroke, hypertension and a chronically trached patient being brought in today with acute respiratory distress from the nursing home.  EMS was called because her trach came out and they were unable to put it back in.  When paramedics arrived patient was apneic, hypoxic with no airway.  A trach was unable to be placed in the field however patient was able to be bagged.  Unclear if there were any other issues prior to this.  Patient is unable to answer any questions as she is currently being bagged  The history is provided by the EMS personnel. The history is limited by the condition of the patient.    Past Medical History:  Diagnosis Date  . Hypertension   . Stroke [facility])    Left sided weakness     Patient Active Problem List   Diagnosis Date Noted  . Tracheostomy status ([place])   . Acute encephalopathy   . Acute respiratory failure with hypoxemia ([place])   . Hypertensive emergency   . Hypoxia   . Sepsis ([place]) 11/07/2017    No past surgical history on file.   OB History   None      Home Medications    Prior to Admission medications   Medication Sig Start Date End Date Taking? Authorizing Provider  acetaminophen (TYLENOL) 325 MG tablet Take 650 mg by mouth 3 (three) times daily.     [provider]  acetaminophen (TYLENOL) 325 MG tablet Take 650 mg by mouth every 6 (six) hours as needed for mild pain, moderate pain, fever or headache.    [provider]  amLODipine (NORVASC) 10 MG tablet Take 1 tablet (10 mg total) by mouth daily. 12/01/17   Roxan Hockey, MD  atorvastatin (LIPITOR) 10 MG tablet Take 30 mg  by mouth at bedtime.    [provider]  calcium-vitamin D (OSCAL WITH D) 500-200 MG-UNIT tablet Take 1 tablet by mouth 2 (two) times daily.    [provider]  cloNIDine (CATAPRES) 0.2 MG tablet Take 1 tablet (0.2 mg total) by mouth 3 (three) times daily. 11/30/17   Roxan Hockey, MD  clopidogrel (PLAVIX) 75 MG tablet Take 75 mg by mouth daily.    [provider]  DULoxetine (CYMBALTA) 30 MG capsule Take 30 mg by mouth daily.     [provider]  ferrous sulfate 325 (65 FE) MG EC tablet Take 325 mg by mouth at bedtime.    [provider]  gabapentin (NEURONTIN) 300 MG capsule Take 300-600 mg by mouth. 300 mg every morning and 600 mg at bedtime    [provider]  guaiFENesin (ROBITUSSIN) 100 MG/5ML SOLN Take 15 mLs by mouth every 4 (four) hours as needed for cough or to loosen phlegm.    [provider]  hydrALAZINE (APRESOLINE) 100 MG tablet Take 1 tablet (100 mg total) by mouth 3 (three) times daily. 11/30/17   Roxan Hockey, MD  insulin aspart (NOVOLOG) 100 UNIT/ML injection Inject 0-9 Units into the skin 3 (three) times daily with meals. Patient not taking: Reported on 03/09/2018 11/30/17   Roxan Hockey, MD  insulin glargine (LANTUS)  100 UNIT/ML injection Inject 0.25 mLs (25 Units total) into the skin daily. 12/01/17   Roxan Hockey, MD  insulin lispro (HUMALOG) 100 UNIT/ML injection Inject 2-8 Units into the skin 2 (two) times daily. 201-250= 2 units 251-300= 4 units 301-350- 6 units 351-400= 8 units > 400 Call MD    [provider]  isosorbide mononitrate (IMDUR) 30 MG 24 hr tablet Take 1 tablet (30 mg total) by mouth daily. 11/30/17 11/30/18  Roxan Hockey, MD  labetalol (NORMODYNE) 200 MG tablet Take 1 tablet (200 mg total) by mouth 3 (three) times daily. 11/30/17   Roxan Hockey, MD  linezolid (ZYVOX) 600 MG tablet Take 1 tablet (600 mg total) by mouth every 12 (twelve) hours. Patient not taking: Reported  on 03/09/2018 11/30/17   Roxan Hockey, MD  Maltodextrin-Xanthan Gum (RESOURCE THICKENUP CLEAR) POWD Take 120 g by mouth as needed. Patient not taking: Reported on 01/24/2018 11/30/17   Roxan Hockey, MD  montelukast (SINGULAIR) 10 MG tablet Take 10 mg by mouth at bedtime.    [provider]  nitroGLYCERIN (NITROSTAT) 0.4 MG SL tablet Place 0.4 mg under the tongue every 5 (five) minutes as needed for chest pain.    [provider]  ondansetron (ZOFRAN) 4 MG tablet Take 1 tablet (4 mg total) by mouth every 6 (six) hours as needed for nausea. 11/30/17   Roxan Hockey, MD  pantoprazole sodium (PROTONIX) 40 mg/20 mL PACK Take 20 mLs (40 mg total) by mouth daily at 12 noon. 12/01/17   Roxan Hockey, MD  polyethylene glycol (MIRALAX / GLYCOLAX) packet Take 17 g by mouth daily as needed for mild constipation or moderate constipation.    [provider]  Vitamin D, Ergocalciferol, (DRISDOL) 50000 units CAPS capsule Take 50,000 Units by mouth every 30 (thirty) days. Takes 17th of each Month    [provider]    Family History No family history on file.  Social History Social History   Tobacco Use  . Smoking status: Former Smoker    Types: Cigarettes  . Smokeless tobacco: Never Used  Substance Use Topics  . Alcohol use: Not Currently  . Drug use: Not Currently     Allergies   Asa [aspirin]; Eggs or egg-derived products; Fish allergy; and Motrin [ibuprofen]   Review of Systems Review of Systems  Unable to perform ROS: Acuity of condition     Physical Exam Updated Vital Signs Wt 95.7 kg   BMI 34.06 kg/m   Physical Exam  Constitutional: She appears well-developed. She appears distressed.  HENT:  Head: Normocephalic and atraumatic.  Eyes: Pupils are equal, round, and reactive to light.  Neck:  Lurline Idol ostoma is bleeding with cartilage present.  Pt is spontaneously breathing and stridorous.  Cardiovascular: Regular rhythm, normal heart  sounds and intact distal pulses. Tachycardia present.  Pulmonary/Chest: Breath sounds normal. She is in respiratory distress.  Stridor present  Abdominal: Soft. There is no tenderness.  Neurological:  Patient is not responsive but is breathing spontaneously  Skin: Skin is warm. Capillary refill takes less than 2 seconds. She is diaphoretic.  Psychiatric:  unresponsive  Nursing note and vitals reviewed.    ED Treatments / Results  Labs (all labs ordered are listed, but only abnormal results are displayed) Labs Reviewed - No data to display  EKG None  Radiology No results found.  Procedures Procedures (including critical care time)  Medications Ordered in ED Medications  fentaNYL (SUBLIMAZE) 100 MCG/2ML injection (has no administration in time range)  dexmedetomidine (PRECEDEX) 200 MCG/50ML (4 mcg/mL) infusion (has no administration in time range)  fentaNYL (SUBLIMAZE) injection 100 mcg (has no administration in time range)  fentaNYL (SUBLIMAZE) injection 100 mcg (has no administration in time range)     Initial Impression / Assessment and Plan / ED Course  I have reviewed the triage vital signs and the nursing notes.  Pertinent labs & imaging results that were available during my care of the patient were reviewed by me and considered in my medical decision making (see chart for details).     Patient is a 62 year old female being brought in by EMS today with acute respiratory distress after trach fell out and a new one was not able to be placed.  Patient was stridorous but was able to be bagged with 100% oxygen saturation.  Pt bagged with 2 person assist until critical care arrived.  Critical care came to bedside and with a bronch and new trach placement kit were able to place a 6 Shiley in the stoma.  Patient was distressed, tachycardic and tachypneic during and after the procedure.  She was placed on the vent and given Precedex and fentanyl.  She will be admitted to the  ICU.  VBG showed compensated respiratory acidosis, normal hemoglobin and renal function.  Troponin within normal limits area and x-ray did show good trach placement with increased lower lobe airspace disease which could reflect atelectasis or new infiltrate.  Could be aspiration  CRITICAL CARE Performed by: Hoorain Kozakiewicz Total critical care time: 30 minutes Critical care time was exclusive of separately billable procedures and treating other patients. Critical care was necessary to treat or prevent imminent or life-threatening deterioration. Critical care was time spent personally by me on the following activities: development of treatment plan with patient and/or surrogate as well as nursing, discussions with consultants, evaluation of patient's response to treatment, examination of patient, obtaining history from patient or surrogate, ordering and performing treatments and interventions, ordering and review of laboratory studies, ordering and review of radiographic studies, pulse oximetry and re-evaluation of patient's condition.   Final Clinical Impressions(s) / ED Diagnoses   Final diagnoses:  Tracheostomy, acute management [facility])  Respiratory distress    ED Discharge Orders    None       Blanchie Dessert, MD 06/18/18 1612

## 2018-06-18 NOTE — Progress Notes (Signed)
1301: Pt arrives via EMS from SNF due to trach dislodgment.  Pt being bagged with BVM, no trach in place.  CCM called and arrived at bedside quickly.  #6 cuffed shiley placed by MD, bronchoscopy performed to confirm placement.  Pt placed on full vent support. Plan is to rest pt overnight and wean off vent in am. Rt will continue to monitor

## 2018-06-18 NOTE — H&P (Addendum)
NAME:  Grace [name], MRN:  [id number], DOB:  [birth year], LOS: 0 ADMISSION DATE:  06/18/2018, CONSULTATION DATE:  06/18/2018 REFERRING MD:  Anitra Lauth, CHIEF COMPLAINT:  Acute Respiratory Failure   Brief History   Pt. presents to ED via EMS form assisted care facility with dislodged chronic trach. She has a history of HTN, DM and previous stroke.Per history she coughed her trach out. Pt was responsive and pushing staff away. She was being bagged  Effectively. Saturations did not drop. Dr. Molli Knock was called to the bedside and emergently placed a # 6 cuffed shiley  trach, bronchoscopy performed to confirm placement. Pt was placed on full vent support.Plan is to rest on vent overnight , wean in am and return to assisted living facility.   All patient history obtained from medical record and very limited information from ED staff   Past Medical History   Past Medical History:  Diagnosis Date  . Hypertension   . Stroke [facility])    Left sided weakness   Chronic trach DM Significant Hospital Events   06/18/2018>> Admission  11/2>> Emergent trach placement  Consults: date of consult/date signed off & final recs:  06/18/2018>>Pulmonary  Procedures (surgical and bedside):  11/2>>Emergent trach change   Significant Diagnostic Tests:  06/18/2018  Micro Data:  None  Antimicrobials:  None   Subjective:  Unable, patient sedated and trached, on mechanical ventilation  Objective   Height 5\' 3"  (1.6 m), weight 95.7 kg.    Vent Mode: PRVC FiO2 (%):  [40 %] 40 % Set Rate:  [15 bmp] 15 bmp Vt Set:  [410 mL-440 mL] 410 mL PEEP:  [5 cmH20] 5 cmH20 Plateau Pressure:  [18 cmH20] 18 cmH20  No intake or output data in the 24 hours ending 06/18/18 1332 Filed Weights   06/18/18 1300  Weight: 95.7 kg    Examination: General: Unresponsive , obese female with chronic trach, on mechanical ventilation HENT: NCAT, chronic trach, thick neck, OG tube Lungs:Bilateral chest excursion, Coarse  throughout with scattered rhonchi,  Cardiovascular: S1, S2, RRR, No RMG Abdomen:ND, NT, BS decreased, obese Extremities: No obvious deformities, cool to touch, weak on left Neuro: Unresponsive , MAE x 4 , weak L arm , does not follow commands GU: Not assessed  Resolved Hospital Problem list   Acute airway obstruction >> resolved  Assessment & Plan:  Acute on chronic respiratory failure Acute airway obstruction>> resolved Chronic trach dislodged with cough Emergently replaced by Dr. Molli Knock Plan Mechanical ventilation overnight with goal to liberate in am Wean FiO2 and PEEP to maintain sats > 94% ABG now Place OG tube  VAP measures  Sedation with precedex and fentanyl as needed for vent synchrony SBT in am  Hx HTN Plan Monitor BP Will resume home Catapress Will hold home scheduled  norvasc and apresoline for now Will add prn apresoline dose     Hx of Stroke Hx L sided weakness Plan: Continue home plavix Monitor neuro status  History DM Hyperglycemia Plan: CBG Q 4 SSI  Plan is to rest overnight on mechanical ventilation overnight, and wean from ventilation in am , and return to assisted living facility   Disposition / Summary of Today's Plan 06/18/18   See above     Diet: NPO Pain/Anxiety/Delirium protocol (if indicated): Precedex gtt, fentanyl prn VAP protocol (if indicated):  DVT prophylaxis: Heparin SQ GI prophylaxis: Protonix Hyperglycemia protocol: CBG Q 4, SSI Mobility: BR Code Status: Full Family Communication: No family present  Labs   CBC: Recent Labs  Lab 06/18/18 1323  HGB 15.3*  HCT 45.0    Basic Metabolic Panel: Recent Labs  Lab 06/18/18 1323  NA 140  K 5.3*  CL 101  GLUCOSE 174*  BUN 16  CREATININE 1.00   GFR: Estimated Creatinine Clearance: 63.4 mL/min (by C-G formula based on SCr of 1 mg/dL). No results for input(s): PROCALCITON, WBC, LATICACIDVEN in the last 168 hours.  Liver Function Tests: No results for input(s):  AST, ALT, ALKPHOS, BILITOT, PROT, ALBUMIN in the last 168 hours. No results for input(s): LIPASE, AMYLASE in the last 168 hours. No results for input(s): AMMONIA in the last 168 hours.  ABG    Component Value Date/Time   PHART 7.449 11/23/2017 1608   PCO2ART 46.0 11/23/2017 1608   PO2ART 83.2 11/23/2017 1608   HCO3 31.4 (H) 11/23/2017 1608   TCO2 36 (H) 06/18/2018 1323   O2SAT 96.1 11/23/2017 1608     Coagulation Profile: No results for input(s): INR, PROTIME in the last 168 hours.  Cardiac Enzymes: No results for input(s): CKTOTAL, CKMB, CKMBINDEX, TROPONINI in the last 168 hours.  HbA1C: Hgb A1c MFr Bld  Date/Time Value Ref Range Status  11/08/2017 09:23 AM 6.7 (H) 4.8 - 5.6 % Final    Comment:    (NOTE) Pre diabetes:          5.7%-6.4% Diabetes:              >6.4% Glycemic control for   <7.0% adults with diabetes     CBG: No results for input(s): GLUCAP in the last 168 hours.  Admitting History of Present Illness.   Pt. presents to ED via EMS form assisted care facility with dislodged chronic trach. She has a history of HTN, DM and previous stroke.Per history she coughed her trach out. Pt was responsive and pushing staff away. She was being bagged  Effectively. Saturations did not drop. Dr. Molli Knock was called to the bedside and emergently placed a # 6 cuffed shiley  trach, bronchoscopy performed to confirm placement. Pt was placed on full vent support.Plan is to rest on vent overnight , wean in am and return to assisted living facility.   Review of Systems:   Unable  Past Medical History  She,  has a past medical history of Hypertension and Stroke (HCC).   Surgical History   No past surgical history on file.   Social History  Lives in an assisted living facility Social History   Socioeconomic History  . Marital status: Married    Spouse name: Not on file  . Number of children: Not on file  . Years of education: Not on file  . Highest education level: Not on  file  Occupational History  . Not on file  Social Needs  . Financial resource strain: Not on file  . Food insecurity:    Worry: Not on file    Inability: Not on file  . Transportation needs:    Medical: Not on file    Non-medical: Not on file  Tobacco Use  . Smoking status: Former Smoker    Types: Cigarettes  . Smokeless tobacco: Never Used  Substance and Sexual Activity  . Alcohol use: Not Currently  . Drug use: Not Currently  . Sexual activity: Not Currently  Lifestyle  . Physical activity:    Days per week: Not on file    Minutes per session: Not on file  . Stress: Not on file  Relationships  . Social connections:    Talks  on phone: Not on file    Gets together: Not on file    Attends religious service: Not on file    Active member of club or organization: Not on file    Attends meetings of clubs or organizations: Not on file    Relationship status: Not on file  . Intimate partner violence:    Fear of current or ex partner: Not on file    Emotionally abused: Not on file    Physically abused: Not on file    Forced sexual activity: Not on file  Other Topics Concern  . Not on file  Social History Narrative  . Not on file  ,  reports that she has quit smoking. Her smoking use included cigarettes. She has never used smokeless tobacco. She reports that she drank alcohol. She reports that she has current or past drug history.   Family History   Her family history is not on file.   Allergies Allergies  Allergen Reactions  . Asa [Aspirin]     Unknown reaction per MAR  . Eggs Or Egg-Derived Products     Unknown reaction per MAR  . Fish Allergy     Unknown reaction per MAR  . Motrin [Ibuprofen]     Unknown reaction per [facility]     Home Medications  Prior to Admission medications   Medication Sig Start Date End Date Taking? Authorizing Provider  acetaminophen (TYLENOL) 325 MG tablet Take 650 mg by mouth 3 (three) times daily.     [provider]    acetaminophen (TYLENOL) 325 MG tablet Take 650 mg by mouth every 6 (six) hours as needed for mild pain, moderate pain, fever or headache.    [provider]  amLODipine (NORVASC) 10 MG tablet Take 1 tablet (10 mg total) by mouth daily. 12/01/17   [name], MD  atorvastatin (LIPITOR) 10 MG tablet Take 30 mg by mouth at bedtime.    [provider]  calcium-vitamin D (OSCAL WITH D) 500-200 MG-UNIT tablet Take 1 tablet by mouth 2 (two) times daily.    [provider]  cloNIDine (CATAPRES) 0.2 MG tablet Take 1 tablet (0.2 mg total) by mouth 3 (three) times daily. 11/30/17   [name], MD  clopidogrel (PLAVIX) 75 MG tablet Take 75 mg by mouth daily.    [provider]  DULoxetine (CYMBALTA) 30 MG capsule Take 30 mg by mouth daily.     [provider]  ferrous sulfate 325 (65 FE) MG EC tablet Take 325 mg by mouth at bedtime.    [provider]  gabapentin (NEURONTIN) 300 MG capsule Take 300-600 mg by mouth. 300 mg every morning and 600 mg at bedtime    [provider]  guaiFENesin (ROBITUSSIN) 100 MG/5ML SOLN Take 15 mLs by mouth every 4 (four) hours as needed for cough or to loosen phlegm.    [provider]  hydrALAZINE (APRESOLINE) 100 MG tablet Take 1 tablet (100 mg total) by mouth 3 (three) times daily. 11/30/17   [name], MD  insulin aspart (NOVOLOG) 100 UNIT/ML injection Inject 0-9 Units into the skin 3 (three) times daily with meals. Patient not taking: Reported on 03/09/2018 11/30/17   [name], MD  insulin glargine (LANTUS) 100 UNIT/ML injection Inject 0.25 mLs (25 Units total) into the skin daily. 12/01/17   [name], MD  insulin lispro (HUMALOG) 100 UNIT/ML injection Inject 2-8 Units into the skin 2 (two) times daily. 201-250= 2 units 251-300= 4 units  301-350- 6 units 351-400= 8 units > 400 Call MD    [provider]  isosorbide mononitrate (IMDUR) 30 MG 24 hr tablet Take 1  tablet (30 mg total) by mouth daily. 11/30/17 11/30/18  [name], MD  labetalol (NORMODYNE) 200 MG tablet Take 1 tablet (200 mg total) by mouth 3 (three) times daily. 11/30/17   [name], MD  linezolid (ZYVOX) 600 MG tablet Take 1 tablet (600 mg total) by mouth every 12 (twelve) hours. Patient not taking: Reported on 03/09/2018 11/30/17   [name], MD  Maltodextrin-Xanthan Gum (RESOURCE THICKENUP CLEAR) POWD Take 120 g by mouth as needed. Patient not taking: Reported on 01/24/2018 11/30/17   [name], MD  montelukast (SINGULAIR) 10 MG tablet Take 10 mg by mouth at bedtime.    [provider]  nitroGLYCERIN (NITROSTAT) 0.4 MG SL tablet Place 0.4 mg under the tongue every 5 (five) minutes as needed for chest pain.    [provider]  ondansetron (ZOFRAN) 4 MG tablet Take 1 tablet (4 mg total) by mouth every 6 (six) hours as needed for nausea. 11/30/17   [name], MD  pantoprazole sodium (PROTONIX) 40 mg/20 mL PACK Take 20 mLs (40 mg total) by mouth daily at 12 noon. 12/01/17   [name], MD  polyethylene glycol (MIRALAX / GLYCOLAX) packet Take 17 g by mouth daily as needed for mild constipation or moderate constipation.    [provider]  Vitamin D, Ergocalciferol, (DRISDOL) 50000 units CAPS capsule Take 50,000 Units by mouth every 30 (thirty) days. Takes 17th of each Month    [provider]    CC APP time 60 minutes Critical care time:     [facility] Pulmonary/Critical Care Medicine Pager # [phone] After 3 pm call [phone] 06/18/2018 2:21 PM  Attending Note:  62 year old female with chronic trach due to CVA and dysphagia who came in from SNF for tach dislodgement.  On exam, she is grimacing to pain.  EDP was emergently bagging the patient upon my arrival and the patient did not have an airway.  I palpated the stoma and see procedure note for emergent tracheostomy.  I reviewed CXR  myself, new trach is in a good position with no infiltrate noted.  Will admit overnight for stabilization of the tracheostomy and likely change to a cuffless 6 in AM and discharge back to the SNF if all is stable.  Treat HTN as above.  Full code status confirmed.  PCCM will admit.  The patient is critically ill with multiple organ systems failure and requires high complexity decision making for assessment and support, frequent evaluation and titration of therapies, application of advanced monitoring technologies and extensive interpretation of multiple databases.   Critical Care Time devoted to patient care services described in this note is  90  Minutes. This time reflects time of care of this signee Dr Koren Bound. This critical care time does not reflect procedure time, or teaching time or supervisory time of PA/NP/Med student/Med Resident etc but could involve care discussion time.  Alyson Reedy, M.D. [facility] Pulmonary/Critical Care Medicine. Pager: [phone]. After hours pager: [phone].

## 2018-06-19 ENCOUNTER — Inpatient Hospital Stay (HOSPITAL_COMMUNITY): Payer: Medicaid Other

## 2018-06-19 LAB — CBC
HEMATOCRIT: 37.2 % (ref 36.0–46.0)
HEMOGLOBIN: 11.3 g/dL — AB (ref 12.0–15.0)
MCH: 25.1 pg — ABNORMAL LOW (ref 26.0–34.0)
MCHC: 30.4 g/dL (ref 30.0–36.0)
MCV: 82.5 fL (ref 80.0–100.0)
NRBC: 0 % (ref 0.0–0.2)
Platelets: 366 10*3/uL (ref 150–400)
RBC: 4.51 MIL/uL (ref 3.87–5.11)
RDW: 17.2 % — ABNORMAL HIGH (ref 11.5–15.5)
WBC: 10.3 10*3/uL (ref 4.0–10.5)

## 2018-06-19 LAB — BASIC METABOLIC PANEL
ANION GAP: 8 (ref 5–15)
BUN: 8 mg/dL (ref 8–23)
CHLORIDE: 99 mmol/L (ref 98–111)
CO2: 29 mmol/L (ref 22–32)
Calcium: 9.4 mg/dL (ref 8.9–10.3)
Creatinine, Ser: 0.8 mg/dL (ref 0.44–1.00)
GFR calc non Af Amer: >60 mL/min (ref >60)
GLUCOSE: 151 mg/dL — AB (ref 70–99)
Potassium: 3.7 mmol/L (ref 3.5–5.1)
Sodium: 136 mmol/L (ref 135–145)

## 2018-06-19 LAB — TROPONIN I: Troponin I: 0.03 ng/mL (ref <0.03)

## 2018-06-19 LAB — GLUCOSE, CAPILLARY
GLUCOSE-CAPILLARY: 162 mg/dL — AB (ref 70–99)
GLUCOSE-CAPILLARY: 120 mg/dL — AB (ref 70–99)
GLUCOSE-CAPILLARY: 132 mg/dL — AB (ref 70–99)
Glucose-Capillary: 145 mg/dL — ABNORMAL HIGH (ref 70–99)
Glucose-Capillary: 120 mg/dL — ABNORMAL HIGH (ref 70–99)

## 2018-06-19 MED ORDER — HYDRALAZINE HCL 50 MG PO TABS
100.0000 mg | ORAL_TABLET | Freq: Three times a day (TID) | ORAL | Status: DC
  Administered 2018-06-19 – 2018-06-20 (×5): 100 mg via ORAL
  Filled 2018-06-19 (×5): qty 2

## 2018-06-19 MED ORDER — FERROUS SULFATE 325 (65 FE) MG PO TABS
325.0000 mg | ORAL_TABLET | Freq: Every day | ORAL | Status: DC
  Administered 2018-06-19 – 2018-06-20 (×2): 325 mg via ORAL
  Filled 2018-06-19 (×2): qty 1

## 2018-06-19 MED ORDER — GABAPENTIN 300 MG PO CAPS
600.0000 mg | ORAL_CAPSULE | Freq: Every day | ORAL | Status: DC
  Administered 2018-06-19 – 2018-06-20 (×2): 600 mg via ORAL
  Filled 2018-06-19 (×3): qty 2

## 2018-06-19 MED ORDER — GABAPENTIN 300 MG PO CAPS: 300.0000 mg | ORAL_CAPSULE | ORAL | Status: DC

## 2018-06-19 MED ORDER — ISOSORBIDE MONONITRATE ER 30 MG PO TB24
30.0000 mg | ORAL_TABLET | Freq: Every day | ORAL | Status: DC
  Administered 2018-06-19 – 2018-06-20 (×2): 30 mg via ORAL
  Filled 2018-06-19 (×2): qty 1

## 2018-06-19 MED ORDER — CLOPIDOGREL BISULFATE 75 MG PO TABS
75.0000 mg | ORAL_TABLET | Freq: Every day | ORAL | Status: DC
  Administered 2018-06-19 – 2018-06-20 (×2): 75 mg via ORAL
  Filled 2018-06-19 (×2): qty 1

## 2018-06-19 MED ORDER — CLONIDINE HCL 0.2 MG PO TABS
0.2000 mg | ORAL_TABLET | Freq: Three times a day (TID) | ORAL | Status: DC
  Administered 2018-06-19 – 2018-06-20 (×5): 0.2 mg via ORAL
  Filled 2018-06-19 (×4): qty 1

## 2018-06-19 MED ORDER — POLYETHYLENE GLYCOL 3350 17 G PO PACK: 17.0000 g | PACK | Freq: Every day | ORAL | Status: DC | PRN

## 2018-06-19 MED ORDER — PANTOPRAZOLE SODIUM 40 MG PO PACK
40.0000 mg | PACK | Freq: Every day | ORAL | Status: DC
  Administered 2018-06-19 – 2018-06-20 (×2): 40 mg via ORAL
  Filled 2018-06-19: qty 20

## 2018-06-19 MED ORDER — LABETALOL HCL 200 MG PO TABS
200.0000 mg | ORAL_TABLET | Freq: Three times a day (TID) | ORAL | Status: DC
  Administered 2018-06-19 – 2018-06-20 (×5): 200 mg via ORAL
  Filled 2018-06-19 (×5): qty 1

## 2018-06-19 MED ORDER — VITAMIN D (ERGOCALCIFEROL) 1.25 MG (50000 UNIT) PO CAPS: 50000.0000 [IU] | ORAL_CAPSULE | ORAL | Status: DC

## 2018-06-19 MED ORDER — DULOXETINE HCL 30 MG PO CPEP
30.0000 mg | ORAL_CAPSULE | Freq: Every day | ORAL | Status: DC
  Administered 2018-06-19 – 2018-06-20 (×2): 30 mg via ORAL
  Filled 2018-06-19 (×2): qty 1

## 2018-06-19 MED ORDER — GABAPENTIN 300 MG PO CAPS
300.0000 mg | ORAL_CAPSULE | Freq: Every day | ORAL | Status: DC
  Administered 2018-06-20: 300 mg via ORAL
  Filled 2018-06-19 (×2): qty 1

## 2018-06-19 MED ORDER — AMLODIPINE BESYLATE 10 MG PO TABS
10.0000 mg | ORAL_TABLET | Freq: Every day | ORAL | Status: DC
  Administered 2018-06-19 – 2018-06-20 (×2): 10 mg via ORAL
  Filled 2018-06-19 (×2): qty 1

## 2018-06-19 MED ORDER — MONTELUKAST SODIUM 10 MG PO TABS
10.0000 mg | ORAL_TABLET | Freq: Every day | ORAL | Status: DC
  Administered 2018-06-19 – 2018-06-20 (×2): 10 mg via ORAL
  Filled 2018-06-19 (×2): qty 1

## 2018-06-19 MED ORDER — PANTOPRAZOLE SODIUM 40 MG PO PACK: 40.0000 mg | PACK | Freq: Every day | ORAL | Status: DC

## 2018-06-19 MED ORDER — ACETAMINOPHEN 325 MG PO TABS
650.0000 mg | ORAL_TABLET | Freq: Three times a day (TID) | ORAL | Status: DC
  Administered 2018-06-19 – 2018-06-20 (×5): 650 mg via ORAL
  Filled 2018-06-19 (×5): qty 2

## 2018-06-19 MED ORDER — ONDANSETRON HCL 4 MG PO TABS: 4.0000 mg | ORAL_TABLET | Freq: Four times a day (QID) | ORAL | Status: DC | PRN

## 2018-06-19 NOTE — Progress Notes (Signed)
Pts SBP has been over 200 since 1600.  All home PO BP have been ordered and given as well as PRN hydralazine. Pts last BP still reading 235/100.  BP cuff has been changed out and location of BP has been switched multiple times for accuracy.  Pt states does not feel any different from this AM. Spoke w/ Dr. Denese Killings to relay all. Per MD, no more meds at this time, give 1 hour, check BP again.

## 2018-06-19 NOTE — H&P (Signed)
NAME:  Grace [name], MRN:  [id number], DOB:  12-Aug-1956, LOS: 1 ADMISSION DATE:  06/18/2018, CONSULTATION DATE:  06/18/2018 REFERRING MD:  Anitra Lauth, CHIEF COMPLAINT:  Acute Respiratory Failure   Brief History   Pt. presents to ED via EMS form assisted care facility with dislodged chronic trach. She has a history of HTN, DM and previous stroke.Per history she coughed her trach out. Pt was responsive and pushing staff away. She was being bagged  Effectively. Saturations did not drop. Dr. Molli Knock was called to the bedside and emergently placed a # 6 cuffed shiley  trach, bronchoscopy performed to confirm placement. Pt was placed on full vent support.Plan is to rest on vent overnight , wean in am and return to assisted living facility.   All patient history obtained from medical record and very limited information from ED staff   Past Medical History   Past Medical History:  Diagnosis Date  . Acute respiratory failure (HCC)   . Dysphagia   . Encephalopathy   . H/O tracheostomy   . Hypertension   . Sepsis (HCC)   . Stroke [facility])    Left sided weakness   Chronic trach DM Significant Hospital Events   06/18/2018>> Admission  11/2>> Emergent trach placement  Consults: date of consult/date signed off & final recs:  06/18/2018>>Pulmonary  Procedures (surgical and bedside):  11/2>>Emergent trach change   Significant Diagnostic Tests:  06/18/2018  Micro Data:  None  Antimicrobials:  None   Subjective:  Patient denies pain. Doesn't feel hungry.  Objective   Blood pressure (!) 170/81, pulse 97, temperature 98.5 F (36.9 C), temperature source Oral, resp. rate (!) 24, height 5\' 3"  (1.6 m), weight 92.7 kg, SpO2 100 %.    Vent Mode: PRVC FiO2 (%):  [35 %-60 %] 35 % Set Rate:  [15 bmp] 15 bmp Vt Set:  [410 mL] 410 mL PEEP:  [5 cmH20] 5 cmH20 Plateau Pressure:  [19 cmH20] 19 cmH20   Intake/Output Summary (Last 24 hours) at 06/19/2018 1501 Last data filed at 06/19/2018 1400 Gross  per 24 hour  Intake 1389.63 ml  Output 2450 ml  Net -1060.37 ml   Filed Weights   06/18/18 1300 06/18/18 1600 06/19/18 0438  Weight: 95.7 kg 91.6 kg 92.7 kg    Examination: General: Awake and alert in no distress HENT: NCAT, chronic trach, thick neck, no bleeding from site. Lungs:Bilateral chest excursion, Chest clear. Cardiovascular: S1, S2, RRR, No RMG Abdomen:ND, NT, BS decreased, obese Extremities: No obvious deformities, cool to touch, weak on left Neuro: Unresponsive , MAE x 4 , weak L arm , follows GU: Not assessed  Resolved Hospital Problem list   Acute airway obstruction >> resolved  Assessment & Plan:  Acute on chronic respiratory failure Acute airway obstruction>> resolved Chronic trach dislodged with cough Emergently replaced by Dr. Molli Knock  HTN  DM CVA with left sided weakness  PLAN:  Switch to #6 cuffless tracheostomy Oral diet as usual Resume home oral medications.   Disposition / Summary of Today's Plan 06/19/18   Discharge to SNF tomorrow.     Diet: usual diet. Pain/Anxiety/Delirium protocol (if indicated): off VAP protocol (if indicated):  DVT prophylaxis: Heparin SQ GI prophylaxis: Protonix Hyperglycemia protocol: CBG Q 4, SSI Mobility: BR Code Status: Full Family Communication: No family present  Labs   CBC: Recent Labs  Lab 06/18/18 1302 06/18/18 1323 06/19/18 0256  WBC 10.8*  --  10.3  NEUTROABS 3.8  --   --   HGB 12.8  15.3* 11.3*  HCT 44.7 45.0 37.2  MCV 87.0  --  82.5  PLT 468*  --  366    Basic Metabolic Panel: Recent Labs  Lab 06/18/18 1323 06/18/18 1354 06/18/18 1605 06/19/18 0256  NA 140  --  139 136  K 5.3*  --  5.4* 3.7  CL 101  --  102 99  CO2  --   --  32 29  GLUCOSE 174*  --  133* 151*  BUN 16  --  14 8  CREATININE 1.00  --  0.87 0.80  CALCIUM  --   --  9.8 9.4  MG  --   --  1.9  --   PHOS  --  4.6  --   --    GFR: Estimated Creatinine Clearance: 77.8 mL/min (by C-G formula based on SCr of 0.8  mg/dL). Recent Labs  Lab 06/18/18 1302 06/19/18 0256  WBC 10.8* 10.3    Liver Function Tests: Recent Labs  Lab 06/18/18 1605  AST 28  ALT 20  ALKPHOS 92  BILITOT 0.5  PROT 7.6  ALBUMIN 3.3*   No results for input(s): LIPASE, AMYLASE in the last 168 hours. No results for input(s): AMMONIA in the last 168 hours.  ABG    Component Value Date/Time   PHART 7.372 06/18/2018 1432   PCO2ART 62.2 (H) 06/18/2018 1432   PO2ART 78.0 (L) 06/18/2018 1432   HCO3 36.2 (H) 06/18/2018 1432   TCO2 38 (H) 06/18/2018 1432   O2SAT 94.0 06/18/2018 1432     Coagulation Profile: Recent Labs  Lab 06/18/18 1354  INR 0.90    Cardiac Enzymes: Recent Labs  Lab 06/18/18 1354 06/18/18 2000 06/19/18 0256  TROPONINI <0.03 0.04* 0.03*    HbA1C: Hgb A1c MFr Bld  Date/Time Value Ref Range Status  11/08/2017 09:23 AM 6.7 (H) 4.8 - 5.6 % Final    Comment:    (NOTE) Pre diabetes:          5.7%-6.4% Diabetes:              >6.4% Glycemic control for   <7.0% adults with diabetes     CBG: Recent Labs  Lab 06/18/18 1915 06/18/18 2326 06/19/18 0341 06/19/18 0753 06/19/18 1144  GLUCAP 161* 117* 162* 120* 145*       Lynnell Catalan, MD [facility] ICU Physician [facility] [place] Critical Care  Pager: [phone] Mobile: [phone] After hours: [phone].

## 2018-06-20 LAB — POCT I-STAT 3, ART BLOOD GAS (G3+)
Acid-Base Excess: 8 mmol/L — ABNORMAL HIGH (ref 0.0–2.0)
Bicarbonate: 34.3 mmol/L — ABNORMAL HIGH (ref 20.0–28.0)
O2 Saturation: 95 %
PCO2 ART: 55.8 mmHg — AB (ref 32.0–48.0)
PH ART: 7.399 (ref 7.350–7.450)
PO2 ART: 79 mmHg — AB (ref 83.0–108.0)
Patient temperature: 99.5
TCO2: 36 mmol/L — ABNORMAL HIGH (ref 22–32)

## 2018-06-20 LAB — GLUCOSE, CAPILLARY
GLUCOSE-CAPILLARY: 101 mg/dL — AB (ref 70–99)
GLUCOSE-CAPILLARY: 205 mg/dL — AB (ref 70–99)
GLUCOSE-CAPILLARY: 138 mg/dL — AB (ref 70–99)
GLUCOSE-CAPILLARY: 131 mg/dL — AB (ref 70–99)
GLUCOSE-CAPILLARY: 112 mg/dL — AB (ref 70–99)
Glucose-Capillary: 174 mg/dL — ABNORMAL HIGH (ref 70–99)
Glucose-Capillary: 198 mg/dL — ABNORMAL HIGH (ref 70–99)
Glucose-Capillary: 211 mg/dL — ABNORMAL HIGH (ref 70–99)
Glucose-Capillary: 91 mg/dL (ref 70–99)

## 2018-06-20 MED ORDER — ALBUTEROL SULFATE (2.5 MG/3ML) 0.083% IN NEBU: 2.5000 mg | INHALATION_SOLUTION | Freq: Four times a day (QID) | RESPIRATORY_TRACT | Status: DC

## 2018-06-20 NOTE — Clinical Social Work Note (Signed)
Clinical Social Worker facilitated patient discharge including contacting patient family and facility to confirm patient discharge plans.  Clinical information faxed to facility and family agreeable with plan.  CSW arranged ambulance transport via [facility] to [facility] .  RN to call [phone] for report prior to discharge.  Clinical Social Worker will sign off for now as social work intervention is no longer needed. Please consult us again if new need arises.  Kateryn Marasigan, LCSWA [phone]  

## 2018-06-20 NOTE — Discharge Summary (Signed)
Physician Discharge Summary  Patient ID: Grace [name] MRN: [id number] DOB/AGE: 63-Mar-1957 63 y.o.  Admit date: 06/18/2018 Discharge date: 06/20/2018    Discharge Diagnoses:  Tracheostomy Malfunction  Chronic Trach  Hx CVA with Left Sided Weakness  DM HTN                                                                     DISCHARGE PLAN BY DIAGNOSIS      Tracheostomy Malfunction  Chronic Trach   Discharge Plan: Trach care per protocol  Continue PMV  Recommend continuing #6 trach for secretion clearance and ease of exchange to cuffed in the event she needs mechanical ventilation (which she has needed in the recent past) 28% ATC as tolerated > ok to wean O2 but would continue humidity for secretion clearance  Hx CVA with Left Sided Weakness   Discharge Plan: PT efforts as able   DM  Discharge Plan: Continue lantus SSI   HTN  Discharge Plan: Continue all prior pre-hospital medications                      DISCHARGE SUMMARY   62 y/o F with hxof prior CVA with residual left sided weakness, DM, HTN and chronic respiratory failure with tracheostomy admitted on 11/2 with dislodged tracheostomy.  She required upsize of tracheostomy to #6 cuffed tracheostomy.  Bronchoscopy performed to confirm placement.  She was rested overnight on vent and transitioned to cuffless tracheostomy 11/3.  She tolerated transition back to PO's and diet.  She had transient hypertension which improved with restart of home medications. She was medically cleared for discharge 11/4 to SNF with plans as above.     Significant Hospital Events   06/18/2018 >> Admission  11/02 >> Emergent trach placement  Consults: date of consult/date signed off & final recs:  06/18/2018 >> Pulmonary  Procedures (surgical and bedside):  11/2 >> Emergent trach change   Significant Diagnostic Tests:  11/2  Bronchoscopy >> trach replacement, no abnormalities of airway noted   Micro Data:   None  Antimicrobials:  None    Discharge Exam: General: adult female sitting up in bed eating lunch, family at bedside  Neuro: Awake, alert, communicates appropriately  CV: s1s2 rrr, no m/r/g PULM: #6 trach midline c/d/i, even/non-labored on 28% ATC  GI: soft/non-tender, bsx4 active  Extremities: warm/dry, no edema  Vitals:   06/20/18 1300 06/20/18 1330 06/20/18 1400 06/20/18 1430  BP: 131/67 (!) 141/74 121/63 (!) 145/76  Pulse: 91 92 78 92  Resp: (!) 27 (!) 31 20 (!) 22  Temp:      TempSrc:      SpO2: 96% 96% 97% 92%  Weight:      Height:         Discharge Labs  BMET Recent Labs  Lab 06/18/18 1323 06/18/18 1354 06/18/18 1605 06/19/18 0256  NA 140  --  139 136  K 5.3*  --  5.4* 3.7  CL 101  --  102 99  CO2  --   --  32 29  GLUCOSE 174*  --  133* 151*  BUN 16  --  14 8  CREATININE 1.00  --  0.87 0.80  CALCIUM  --   --  9.8  9.4  MG  --   --  1.9  --   PHOS  --  4.6  --   --     CBC Recent Labs  Lab 06/18/18 1302 06/18/18 1323 06/19/18 0256  HGB 12.8 15.3* 11.3*  HCT 44.7 45.0 37.2  WBC 10.8*  --  10.3  PLT 468*  --  366    Anti-Coagulation Recent Labs  Lab 06/18/18 1354  INR 0.90    Discharge Instructions    Call MD for:   Complete by:  As directed    Call MD for:  difficulty breathing, headache or visual disturbances   Complete by:  As directed    Call MD for:  extreme fatigue   Complete by:  As directed    Call MD for:  hives   Complete by:  As directed    Call MD for:  persistant dizziness or light-headedness   Complete by:  As directed    Call MD for:  persistant nausea and vomiting   Complete by:  As directed    Call MD for:  redness, tenderness, or signs of infection (pain, swelling, redness, odor or green/yellow discharge around incision site)   Complete by:  As directed    Call MD for:  severe uncontrolled pain   Complete by:  As directed    Call MD for:  temperature >100.4   Complete by:  As directed    Diet - low sodium  heart healthy   Complete by:  As directed    Discharge instructions   Complete by:  As directed    1.  Review medications carefully as they may have changed.  2.  Trach changed to #6 cuffless Shiley > would not downsize  3.  Trach care per protocol.  Continue PMV use with Speech Language Pathology  4.  Return to ER if new or worsening symptoms.   Increase activity slowly   Complete by:  As directed           Allergies as of 06/20/2018      Reactions   Asa [aspirin]    Unknown reaction per MAR   Eggs Or Egg-derived Products    Unknown reaction per [facility]   Fish Allergy    Unknown reaction per MAR   Motrin [ibuprofen]    Unknown reaction per [facility]      Medication List    STOP taking these medications   linezolid 600 MG tablet Commonly known as:  ZYVOX   ondansetron 4 MG tablet Commonly known as:  ZOFRAN     TAKE these medications   acetaminophen 325 MG tablet Commonly known as:  TYLENOL Take 650 mg by mouth 3 (three) times daily.   amLODipine 10 MG tablet Commonly known as:  NORVASC Take 1 tablet (10 mg total) by mouth daily.   atorvastatin 10 MG tablet Commonly known as:  LIPITOR Take 30 mg by mouth at bedtime.   calcium-vitamin D 500-200 MG-UNIT tablet Commonly known as:  OSCAL WITH D Take 1 tablet by mouth 2 (two) times daily.   cloNIDine 0.2 MG tablet Commonly known as:  CATAPRES Take 1 tablet (0.2 mg total) by mouth 3 (three) times daily.   clopidogrel 75 MG tablet Commonly known as:  PLAVIX Take 75 mg by mouth at bedtime.   DULoxetine 30 MG capsule Commonly known as:  CYMBALTA Take 30 mg by mouth daily.   ferrous sulfate 325 (65 FE) MG EC tablet Take 325 mg by mouth  at bedtime.   gabapentin 300 MG capsule Commonly known as:  NEURONTIN Take 300-600 mg by mouth See admin instructions. Take 300 mg in the morning and 600 mg in the evening   hydrALAZINE 100 MG tablet Commonly known as:  APRESOLINE Take 1 tablet (100 mg total) by mouth 3 (three)  times daily.   insulin aspart 100 UNIT/ML injection Commonly known as:  novoLOG Inject 0-9 Units into the skin 3 (three) times daily with meals.   insulin glargine 100 UNIT/ML injection Commonly known as:  LANTUS Inject 0.25 mLs (25 Units total) into the skin daily.   isosorbide mononitrate 30 MG 24 hr tablet Commonly known as:  IMDUR Take 1 tablet (30 mg total) by mouth daily.   labetalol 200 MG tablet Commonly known as:  NORMODYNE Take 1 tablet (200 mg total) by mouth 3 (three) times daily.   montelukast 10 MG tablet Commonly known as:  SINGULAIR Take 10 mg by mouth at bedtime.   nitroGLYCERIN 0.4 MG SL tablet Commonly known as:  NITROSTAT Place 0.4 mg under the tongue every 5 (five) minutes as needed for chest pain.   pantoprazole sodium 40 mg/20 mL Pack Commonly known as:  PROTONIX Take 20 mLs (40 mg total) by mouth daily at 12 noon.   polyethylene glycol packet Commonly known as:  MIRALAX / GLYCOLAX Take 17 g by mouth daily as needed for mild constipation or moderate constipation.   RESOURCE THICKENUP CLEAR Powd Take 120 g by mouth as needed.   Vitamin D (Ergocalciferol) 50000 units Caps capsule Commonly known as:  DRISDOL Take 50,000 Units by mouth every 30 (thirty) days. Takes 17th of each Month         Disposition: SNF.  No home health needs identified at time of discharge.   Discharged Condition: Kaleia Longhi has met maximum benefit of inpatient care and is medically stable and cleared for discharge.  Patient is pending follow up as above.      Time spent on disposition:  35 Minutes.   Signed: Noe Gens, NP-C [facility] Pgr: [phone] Office: [phone]

## 2018-06-20 NOTE — Progress Notes (Signed)
Pt w/ orders to transport back to [facility]. Spoke w/ PTAR who states pt is 9th on list and pickup will be at least 2 hours out. Per PTAR, pt may be transported any time throughout night

## 2018-06-20 NOTE — NC FL2 (Signed)
[place] MEDICAID FL2 LEVEL OF CARE SCREENING TOOL     IDENTIFICATION  Patient Name: Grace [name] Birthdate: [birth year] Sex: female Admission Date (Current Location): 06/18/2018  [facility] and [place] Number:  Producer, television/film/video and Address:  [facility], [address], [place], [place] [address]      Provider Number: [id number]  Attending Physician Name and Address:  Chilton Greathouse, MD  Relative Name and Phone Number:       Current Level of Care: Hospital Recommended Level of Care: Skilled Nursing Facility Prior Approval Number:    Date Approved/Denied:   PASRR Number:    Discharge Plan: SNF    Current Diagnoses: Patient Active Problem List   Diagnosis Date Noted  . Acute respiratory failure (HCC) 06/18/2018  . Tracheostomy status (HCC)   . Acute encephalopathy   . Acute respiratory failure with hypoxemia (HCC)   . Hypertensive emergency   . Hypoxia   . Sepsis (HCC) 11/07/2017    Orientation RESPIRATION BLADDER Height & Weight     (pt is on trach)  Tracheostomy(28%) External catheter, Incontinent(placed 11/2) Weight: 204 lb 5.9 oz (92.7 kg) Height:  5\' 3"  (160 cm)  BEHAVIORAL SYMPTOMS/MOOD NEUROLOGICAL BOWEL NUTRITION STATUS      Incontinent Diet(soft diet, thin liquids)  AMBULATORY STATUS COMMUNICATION OF NEEDS Skin   Extensive Assist Does not communicate Normal                       Personal Care Assistance Level of Assistance  Bathing, Feeding, Dressing Bathing Assistance: Maximum assistance Feeding assistance: Limited assistance Dressing Assistance: Maximum assistance     Functional Limitations Info  Sight, Hearing Sight Info: Adequate Hearing Info: Adequate Speech Info: Impaired(pt on trach)    SPECIAL CARE FACTORS FREQUENCY  PT (By licensed PT), OT (By licensed OT)                    Contractures Contractures Info: Not present    Additional Factors Info  Code Status, Allergies Code Status  Info: Full Code Allergies Info:  Asa Aspirin, Eggs Or Egg-derived Products, Fish Allergy, Motrin Ibuprofen           Current Medications (06/20/2018):  This is the current hospital active medication list Current Facility-Administered Medications  Medication Dose Route Frequency Provider Last Rate Last Dose  . acetaminophen (TYLENOL) tablet 650 mg  650 mg Oral Q4H PRN Bevelyn Ngo, NP      . acetaminophen (TYLENOL) tablet 650 mg  650 mg Oral TID Lynnell Catalan, MD   650 mg at 06/20/18 0922  . albuterol (PROVENTIL) (2.5 MG/3ML) 0.083% nebulizer solution 2.5 mg  2.5 mg Nebulization Q4H Bevelyn Ngo, NP   2.5 mg at 06/20/18 1130  . amLODipine (NORVASC) tablet 10 mg  10 mg Oral Daily Lynnell Catalan, MD   10 mg at 06/20/18 0923  . atorvastatin (LIPITOR) tablet 10 mg  10 mg Per Tube [id number] Bevelyn Ngo, NP   10 mg at 06/19/18 1811  . chlorhexidine (PERIDEX) 0.12 % solution 15 mL  15 mL Mouth Rinse BID Alyson Reedy, MD   15 mL at 06/20/18 0920  . cloNIDine (CATAPRES) tablet 0.2 mg  0.2 mg Oral TID Lynnell Catalan, MD   0.2 mg at 06/20/18 0923  . clopidogrel (PLAVIX) tablet 75 mg  75 mg Oral QHS Lynnell Catalan, MD   75 mg at 06/19/18 2109  . dexmedetomidine (PRECEDEX) 400 MCG/100ML (4 mcg/mL)  infusion  0.4-1.2 mcg/kg/hr Intravenous Titrated Alyson Reedy, MD   Stopped at 06/19/18 1148  . dextrose 5 %-0.45 % sodium chloride infusion   Intravenous Continuous Bevelyn Ngo, NP   Stopped at 06/20/18 1411  . DULoxetine (CYMBALTA) DR capsule 30 mg  30 mg Oral Daily Lynnell Catalan, MD   30 mg at 06/20/18 0923  . fentaNYL (SUBLIMAZE) injection 100 mcg  100 mcg Intravenous Q15 min PRN Alyson Reedy, MD   100 mcg at 06/18/18 1320  . fentaNYL (SUBLIMAZE) injection 100 mcg  100 mcg Intravenous Q2H PRN Alyson Reedy, MD      . ferrous sulfate tablet 325 mg  325 mg Oral QHS Lynnell Catalan, MD   325 mg at 06/19/18 2109  . gabapentin (NEURONTIN) capsule 300 mg  300 mg Oral Daily Alyson Reedy, MD   300  mg at 06/20/18 0923  . gabapentin (NEURONTIN) capsule 600 mg  600 mg Oral QHS Alyson Reedy, MD   600 mg at 06/19/18 2108  . heparin injection 5,000 Units  5,000 Units Subcutaneous Q8H Bevelyn Ngo, NP   5,000 Units at 06/20/18 1410  . hydrALAZINE (APRESOLINE) injection 5 mg  5 mg Intravenous Q4H PRN Bevelyn Ngo, NP   5 mg at 06/19/18 1625  . hydrALAZINE (APRESOLINE) tablet 100 mg  100 mg Oral TID Lynnell Catalan, MD   100 mg at 06/20/18 0923  . insulin aspart (novoLOG) injection 0-15 Units  0-15 Units Subcutaneous Q4H Bevelyn Ngo, NP   5 Units at 06/20/18 1153  . isosorbide mononitrate (IMDUR) 24 hr tablet 30 mg  30 mg Oral Daily Lynnell Catalan, MD   30 mg at 06/20/18 0923  . labetalol (NORMODYNE) tablet 200 mg  200 mg Oral TID Lynnell Catalan, MD   200 mg at 06/20/18 0923  . MEDLINE mouth rinse  15 mL Mouth Rinse q12n4p Alyson Reedy, MD   15 mL at 06/20/18 1159  . montelukast (SINGULAIR) tablet 10 mg  10 mg Oral QHS Lynnell Catalan, MD   10 mg at 06/19/18 2109  . ondansetron (ZOFRAN) injection 4 mg  4 mg Intravenous Q6H PRN Bevelyn Ngo, NP      . ondansetron [facility]) tablet 4 mg  4 mg Oral Q6H PRN Lynnell Catalan, MD      . pantoprazole sodium (PROTONIX) 40 mg/20 mL oral suspension 40 mg  40 mg Oral QHS Alyson Reedy, MD   40 mg at 06/19/18 2103  . polyethylene glycol (MIRALAX / GLYCOLAX) packet 17 g  17 g Oral Daily PRN Lynnell Catalan, MD      . Melene Muller ON 07/03/2018] Vitamin D (Ergocalciferol) (DRISDOL) capsule 50,000 Units  50,000 Units Oral Q30 days Lynnell Catalan, MD         Discharge Medications: Please see discharge summary for a list of discharge medications.  Relevant Imaging Results:  Relevant Lab Results:   Additional Information SS# [id number]  Maree Krabbe, LCSW

## 2018-06-20 NOTE — Clinical Social Work Placement (Signed)
   CLINICAL SOCIAL WORK PLACEMENT  NOTE  Date:  06/20/2018  Patient Details  Name: Grace [name] MRN: [id number] Date of Birth: 1956/01/01  Clinical Social Work is seeking post-discharge placement for this patient at the Skilled  Nursing Facility level of care (*CSW will initial, date and re-position this form in  chart as items are completed):      Patient/family provided with [facility] Clinical Social Work Department's list of facilities offering this level of care within the geographic area requested by the patient (or if unable, by the patient's family).  Yes   Patient/family informed of their freedom to choose among providers that offer the needed level of care, that participate in Medicare, Medicaid or managed care program needed by the patient, have an available bed and are willing to accept the patient.      Patient/family informed of [facility]'s ownership interest in [facility] and [facility], as well as of the fact that they are under no obligation to receive care at these facilities.  PASRR submitted to EDS on       PASRR number received on       Existing PASRR number confirmed on       FL2 transmitted to all facilities in geographic area requested by pt/family on       FL2 transmitted to all facilities within larger geographic area on       Patient informed that his/her managed care company has contracts with or will negotiate with certain facilities, including the following:            Patient/family informed of bed offers received.  Patient chooses bed at [facility]     Physician recommends and patient chooses bed at      Patient to be transferred to [place] on 06/20/18.  Patient to be transferred to facility by PTAR     Patient family notified on 06/20/18 of transfer.  Name of family member notified:  Johnny     PHYSICIAN       Additional Comment:    _______________________________________________ Maree Krabbe, LCSW 06/20/2018,  3:39 PM

## 2018-06-20 NOTE — Clinical Social Work Note (Signed)
Pt is LTC at [facility]. Pt will return at d/c.   [place], [place] [phone]

## 2018-06-20 NOTE — Progress Notes (Signed)
Received patient from Day RN Shanda Bumps.  VSS.  Patient fed dinner and ate 100%.  Medications given.  Patient appears comfortable at this time. Awaiting transportation to [facility].

## 2018-06-20 NOTE — Progress Notes (Signed)
Report given to [place], receiving RN at [facility]. Relayed that pt most likely will not be transported for a few hours. Wilkie Aye states this is not a problem.

## 2018-06-20 NOTE — Clinical Social Work Note (Signed)
Clinical Social Work Assessment  Patient Details  Name: Grace [name] MRN: [id number] Date of Birth: Feb 05, 1956  Date of referral:  06/20/18               Reason for consult:  Facility Placement                Permission sought to share information with:  Family Supports Permission granted to share information::     Name::     Engineer, production::  [employer]  Relationship::  spouse  Contact Information:     Housing/Transportation Living arrangements for the past 2 months:  Skilled Building surveyor of Information:  Spouse Patient Interpreter Needed:  None Criminal Activity/Legal Involvement Pertinent to Current Situation/Hospitalization:  No - Comment as needed Significant Relationships:  Adult Children, Spouse Lives with:  Facility Resident Do you feel safe going back to the place where you live?  Yes Need for family participation in patient care:  No (Coment)  Care giving concerns:  CSW spoke with pt's spouse via telephone. Pt has a trach.   Social Worker assessment / plan:  Pt's spouse confirms pt is LTC at [facility]. Pt's spouse agreeable to pt returning today. CSW to follow up with facility.  Employment status:  Disabled (Comment on whether or not currently receiving Disability) Insurance information:  Medicaid In [place] PT Recommendations:  Not assessed at this time Information / Referral to community resources:  Skilled Nursing Facility  Patient/Family's Response to care:  Pt's spouse verbalized understanding of CSW role and expressed appreciation for support. Pt's spouse denies any concern regarding pt care at this time.   Patient/Family's Understanding of and Emotional Response to Diagnosis, Current Treatment, and Prognosis:  Pt's spouse understanding and realistic regarding pt's physical limitations. Pt's spouse understands the need for pt to return to SNF at d/c. Pt's spouse denies any concern regarding pt's treatment plan at this time. CSW will continue to  provide support and facilitate d/c needs.   Emotional Assessment Appearance:  Appears stated age Attitude/Demeanor/Rapport:  Unable to Assess Affect (typically observed):  Unable to Assess Orientation:  (unable to determine, pt is on trach) Alcohol / Substance use:  Not Applicable Psych involvement (Current and /or in the community):  No (Comment)  Discharge Needs  Concerns to be addressed:  Basic Needs Readmission within the last 30 days:  No Current discharge risk:  Dependent with Mobility Barriers to Discharge:  No Barriers Identified   Elida Harbin A Delaila Nand, LCSW 06/20/2018, 3:37 PM

## 2018-06-21 NOTE — Progress Notes (Signed)
Patient transferred to stretcher and wheeled off unit without incident.   Paperwork and belongings accompanied.

## 2018-06-24 ENCOUNTER — Inpatient Hospital Stay (HOSPITAL_COMMUNITY)
Admission: EM | Admit: 2018-06-24 | Discharge: 2018-07-17 | DRG: 296 | Disposition: E | Payer: Medicaid Other | Attending: Pulmonary Disease | Admitting: Pulmonary Disease

## 2018-06-24 ENCOUNTER — Encounter (HOSPITAL_COMMUNITY): Payer: Self-pay | Admitting: Emergency Medicine

## 2018-06-24 DIAGNOSIS — Z7902 Long term (current) use of antithrombotics/antiplatelets: Secondary | ICD-10-CM

## 2018-06-24 DIAGNOSIS — R579 Shock, unspecified: Secondary | ICD-10-CM | POA: Diagnosis present

## 2018-06-24 DIAGNOSIS — Z794 Long term (current) use of insulin: Secondary | ICD-10-CM

## 2018-06-24 DIAGNOSIS — I10 Essential (primary) hypertension: Secondary | ICD-10-CM | POA: Diagnosis present

## 2018-06-24 DIAGNOSIS — N179 Acute kidney failure, unspecified: Secondary | ICD-10-CM | POA: Diagnosis present

## 2018-06-24 DIAGNOSIS — Z4659 Encounter for fitting and adjustment of other gastrointestinal appliance and device: Secondary | ICD-10-CM

## 2018-06-24 DIAGNOSIS — Z91013 Allergy to seafood: Secondary | ICD-10-CM

## 2018-06-24 DIAGNOSIS — E874 Mixed disorder of acid-base balance: Secondary | ICD-10-CM | POA: Diagnosis present

## 2018-06-24 DIAGNOSIS — G936 Cerebral edema: Secondary | ICD-10-CM | POA: Diagnosis present

## 2018-06-24 DIAGNOSIS — J9602 Acute respiratory failure with hypercapnia: Secondary | ICD-10-CM

## 2018-06-24 DIAGNOSIS — R198 Other specified symptoms and signs involving the digestive system and abdomen: Secondary | ICD-10-CM

## 2018-06-24 DIAGNOSIS — J9600 Acute respiratory failure, unspecified whether with hypoxia or hypercapnia: Secondary | ICD-10-CM

## 2018-06-24 DIAGNOSIS — G9341 Metabolic encephalopathy: Secondary | ICD-10-CM | POA: Diagnosis present

## 2018-06-24 DIAGNOSIS — Z91012 Allergy to eggs: Secondary | ICD-10-CM

## 2018-06-24 DIAGNOSIS — G9382 Brain death: Secondary | ICD-10-CM | POA: Diagnosis present

## 2018-06-24 DIAGNOSIS — G931 Anoxic brain damage, not elsewhere classified: Secondary | ICD-10-CM | POA: Diagnosis present

## 2018-06-24 DIAGNOSIS — Z66 Do not resuscitate: Secondary | ICD-10-CM | POA: Diagnosis present

## 2018-06-24 DIAGNOSIS — Z87891 Personal history of nicotine dependence: Secondary | ICD-10-CM

## 2018-06-24 DIAGNOSIS — Z930 Tracheostomy status: Secondary | ICD-10-CM

## 2018-06-24 DIAGNOSIS — E119 Type 2 diabetes mellitus without complications: Secondary | ICD-10-CM | POA: Diagnosis present

## 2018-06-24 DIAGNOSIS — Z886 Allergy status to analgesic agent status: Secondary | ICD-10-CM

## 2018-06-24 DIAGNOSIS — J9621 Acute and chronic respiratory failure with hypoxia: Secondary | ICD-10-CM | POA: Diagnosis present

## 2018-06-24 DIAGNOSIS — I469 Cardiac arrest, cause unspecified: Secondary | ICD-10-CM | POA: Diagnosis present

## 2018-06-24 DIAGNOSIS — I69354 Hemiplegia and hemiparesis following cerebral infarction affecting left non-dominant side: Secondary | ICD-10-CM

## 2018-06-24 MED ORDER — SODIUM CHLORIDE 0.9 % IV SOLN
INTRAVENOUS | Status: AC | PRN
  Administered 2018-06-24: 1000 mL via INTRAVENOUS

## 2018-06-24 MED ORDER — NOREPINEPHRINE 4 MG/250ML-% IV SOLN
0.0000 ug/min | Freq: Once | INTRAVENOUS | Status: AC
  Administered 2018-06-24: 20 ug/min via INTRAVENOUS

## 2018-06-24 MED ORDER — PHENYLEPHRINE 40 MCG/ML (10ML) SYRINGE FOR IV PUSH (FOR BLOOD PRESSURE SUPPORT)
PREFILLED_SYRINGE | INTRAVENOUS | Status: AC
  Administered 2018-06-24: 40 ug
  Filled 2018-06-24: qty 10

## 2018-06-24 NOTE — ED Notes (Signed)
Trach changed by Dr. Blinda Leatherwood

## 2018-06-24 NOTE — ED Notes (Signed)
BIB EMS from [facility] as post CPR. Pt was found during a med pass unresponsive with agonal RR then went pulseless. CPR started and continued for approx 20 min. Received 5 Epi. Trach in place, being ventilated. I/O to R tibia.

## 2018-06-25 ENCOUNTER — Inpatient Hospital Stay (HOSPITAL_COMMUNITY): Payer: Medicaid Other

## 2018-06-25 ENCOUNTER — Emergency Department (HOSPITAL_COMMUNITY): Payer: Medicaid Other

## 2018-06-25 DIAGNOSIS — I469 Cardiac arrest, cause unspecified: Secondary | ICD-10-CM | POA: Diagnosis present

## 2018-06-25 DIAGNOSIS — Z91013 Allergy to seafood: Secondary | ICD-10-CM | POA: Diagnosis not present

## 2018-06-25 DIAGNOSIS — G9341 Metabolic encephalopathy: Secondary | ICD-10-CM | POA: Insufficient documentation

## 2018-06-25 DIAGNOSIS — I10 Essential (primary) hypertension: Secondary | ICD-10-CM | POA: Diagnosis present

## 2018-06-25 DIAGNOSIS — Z7902 Long term (current) use of antithrombotics/antiplatelets: Secondary | ICD-10-CM | POA: Diagnosis not present

## 2018-06-25 DIAGNOSIS — Z886 Allergy status to analgesic agent status: Secondary | ICD-10-CM | POA: Diagnosis not present

## 2018-06-25 DIAGNOSIS — J9602 Acute respiratory failure with hypercapnia: Secondary | ICD-10-CM | POA: Diagnosis not present

## 2018-06-25 DIAGNOSIS — N179 Acute kidney failure, unspecified: Secondary | ICD-10-CM | POA: Insufficient documentation

## 2018-06-25 DIAGNOSIS — G9382 Brain death: Secondary | ICD-10-CM | POA: Diagnosis present

## 2018-06-25 DIAGNOSIS — G931 Anoxic brain damage, not elsewhere classified: Secondary | ICD-10-CM | POA: Diagnosis present

## 2018-06-25 DIAGNOSIS — Z930 Tracheostomy status: Secondary | ICD-10-CM | POA: Diagnosis not present

## 2018-06-25 DIAGNOSIS — J9621 Acute and chronic respiratory failure with hypoxia: Secondary | ICD-10-CM | POA: Diagnosis present

## 2018-06-25 DIAGNOSIS — Z794 Long term (current) use of insulin: Secondary | ICD-10-CM | POA: Diagnosis not present

## 2018-06-25 DIAGNOSIS — G936 Cerebral edema: Secondary | ICD-10-CM | POA: Diagnosis present

## 2018-06-25 DIAGNOSIS — E119 Type 2 diabetes mellitus without complications: Secondary | ICD-10-CM | POA: Diagnosis present

## 2018-06-25 DIAGNOSIS — I69354 Hemiplegia and hemiparesis following cerebral infarction affecting left non-dominant side: Secondary | ICD-10-CM | POA: Diagnosis not present

## 2018-06-25 DIAGNOSIS — R579 Shock, unspecified: Secondary | ICD-10-CM | POA: Diagnosis present

## 2018-06-25 DIAGNOSIS — E874 Mixed disorder of acid-base balance: Secondary | ICD-10-CM | POA: Diagnosis present

## 2018-06-25 DIAGNOSIS — Z66 Do not resuscitate: Secondary | ICD-10-CM | POA: Diagnosis present

## 2018-06-25 DIAGNOSIS — Z87891 Personal history of nicotine dependence: Secondary | ICD-10-CM | POA: Diagnosis not present

## 2018-06-25 DIAGNOSIS — Z91012 Allergy to eggs: Secondary | ICD-10-CM | POA: Diagnosis not present

## 2018-06-25 LAB — I-STAT CHEM 8, ED
BUN: 19 mg/dL (ref 8–23)
CHLORIDE: 104 mmol/L (ref 98–111)
Calcium, Ion: 1.21 mmol/L (ref 1.15–1.40)
Creatinine, Ser: 1.3 mg/dL — ABNORMAL HIGH (ref 0.44–1.00)
Glucose, Bld: 265 mg/dL — ABNORMAL HIGH (ref 70–99)
HEMATOCRIT: 39 % (ref 36.0–46.0)
Hemoglobin: 13.3 g/dL (ref 12.0–15.0)
POTASSIUM: 4.8 mmol/L (ref 3.5–5.1)
SODIUM: 141 mmol/L (ref 135–145)
TCO2: 29 mmol/L (ref 22–32)

## 2018-06-25 LAB — CBC
HCT: 40.1 % (ref 36.0–46.0)
Hemoglobin: 10.9 g/dL — ABNORMAL LOW (ref 12.0–15.0)
MCH: 24.4 pg — ABNORMAL LOW (ref 26.0–34.0)
MCHC: 27.2 g/dL — ABNORMAL LOW (ref 30.0–36.0)
MCV: 89.7 fL (ref 80.0–100.0)
NRBC: 0.4 % — AB (ref 0.0–0.2)
PLATELETS: 419 10*3/uL — AB (ref 150–400)
RBC: 4.47 MIL/uL (ref 3.87–5.11)
RDW: 17.7 % — ABNORMAL HIGH (ref 11.5–15.5)
WBC: 12.7 10*3/uL — AB (ref 4.0–10.5)

## 2018-06-25 LAB — URINALYSIS, ROUTINE W REFLEX MICROSCOPIC
BILIRUBIN URINE: NEGATIVE
Glucose, UA: 50 mg/dL — AB
Ketones, ur: NEGATIVE mg/dL
LEUKOCYTES UA: NEGATIVE
NITRITE: NEGATIVE
PH: 7 (ref 5.0–8.0)
Protein, ur: 100 mg/dL — AB
SPECIFIC GRAVITY, URINE: 1.005 (ref 1.005–1.030)

## 2018-06-25 LAB — COMPREHENSIVE METABOLIC PANEL
ALK PHOS: 105 U/L (ref 38–126)
ALT: 79 U/L — ABNORMAL HIGH (ref 0–44)
AST: 103 U/L — AB (ref 15–41)
Albumin: 3.1 g/dL — ABNORMAL LOW (ref 3.5–5.0)
Anion gap: 11 (ref 5–15)
BILIRUBIN TOTAL: 0.3 mg/dL (ref 0.3–1.2)
BUN: 17 mg/dL (ref 8–23)
CO2: 26 mmol/L (ref 22–32)
CREATININE: 1.36 mg/dL — AB (ref 0.44–1.00)
Calcium: 9.3 mg/dL (ref 8.9–10.3)
Chloride: 103 mmol/L (ref 98–111)
GFR calc Af Amer: 47 mL/min — ABNORMAL LOW (ref >60)
GFR, EST NON AFRICAN AMERICAN: 40 mL/min — AB (ref >60)
Glucose, Bld: 265 mg/dL — ABNORMAL HIGH (ref 70–99)
POTASSIUM: 4.8 mmol/L (ref 3.5–5.1)
Sodium: 140 mmol/L (ref 135–145)
Total Protein: 7.4 g/dL (ref 6.5–8.1)

## 2018-06-25 LAB — I-STAT TROPONIN, ED: TROPONIN I, POC: 0.02 ng/mL (ref 0.00–0.08)

## 2018-06-25 LAB — GLUCOSE, CAPILLARY
GLUCOSE-CAPILLARY: 132 mg/dL — AB (ref 70–99)
GLUCOSE-CAPILLARY: 98 mg/dL (ref 70–99)
Glucose-Capillary: 189 mg/dL — ABNORMAL HIGH (ref 70–99)
Glucose-Capillary: 122 mg/dL — ABNORMAL HIGH (ref 70–99)
Glucose-Capillary: 111 mg/dL — ABNORMAL HIGH (ref 70–99)

## 2018-06-25 LAB — I-STAT ARTERIAL BLOOD GAS, ED
BICARBONATE: 27.9 mmol/L (ref 20.0–28.0)
O2 Saturation: 100 %
PO2 ART: 213 mmHg — AB (ref 83.0–108.0)
Patient temperature: 98.6
TCO2: 30 mmol/L (ref 22–32)
pCO2 arterial: 59.2 mmHg — ABNORMAL HIGH (ref 32.0–48.0)
pH, Arterial: 7.281 — ABNORMAL LOW (ref 7.350–7.450)

## 2018-06-25 LAB — I-STAT CG4 LACTIC ACID, ED
Lactic Acid, Venous: 2.76 mmol/L (ref 0.5–1.9)
Lactic Acid, Venous: 7.73 mmol/L (ref 0.5–1.9)

## 2018-06-25 MED ORDER — MIDAZOLAM HCL 2 MG/2ML IJ SOLN: 2.0000 mg | INTRAMUSCULAR | Status: DC | PRN

## 2018-06-25 MED ORDER — CHLORHEXIDINE GLUCONATE 0.12% ORAL RINSE (MEDLINE KIT)
15.0000 mL | Freq: Two times a day (BID) | OROMUCOSAL | Status: DC
  Administered 2018-06-25 – 2018-06-26 (×2): 15 mL via OROMUCOSAL

## 2018-06-25 MED ORDER — METOPROLOL TARTRATE 5 MG/5ML IV SOLN: 5.0000 mg | Freq: Once | INTRAVENOUS | Status: DC | PRN

## 2018-06-25 MED ORDER — IPRATROPIUM-ALBUTEROL 0.5-2.5 (3) MG/3ML IN SOLN
3.0000 mL | Freq: Four times a day (QID) | RESPIRATORY_TRACT | Status: DC
  Administered 2018-06-25 – 2018-06-26 (×5): 3 mL via RESPIRATORY_TRACT
  Filled 2018-06-25 (×5): qty 3

## 2018-06-25 MED ORDER — ORAL CARE MOUTH RINSE
15.0000 mL | OROMUCOSAL | Status: DC
  Administered 2018-06-25 – 2018-06-26 (×8): 15 mL via OROMUCOSAL

## 2018-06-25 MED ORDER — FAMOTIDINE IN NACL 20-0.9 MG/50ML-% IV SOLN
20.0000 mg | Freq: Two times a day (BID) | INTRAVENOUS | Status: DC
  Administered 2018-06-25 (×2): 20 mg via INTRAVENOUS
  Filled 2018-06-25 (×3): qty 50

## 2018-06-25 MED ORDER — SODIUM CHLORIDE 0.9 % IV SOLN
INTRAVENOUS | Status: DC
  Administered 2018-06-25 (×3): via INTRAVENOUS

## 2018-06-25 MED ORDER — METOPROLOL TARTRATE 5 MG/5ML IV SOLN
INTRAVENOUS | Status: AC
  Filled 2018-06-25: qty 5

## 2018-06-25 MED ORDER — FENTANYL CITRATE (PF) 100 MCG/2ML IJ SOLN: 100.0000 ug | INTRAMUSCULAR | Status: DC | PRN

## 2018-06-25 MED ORDER — INSULIN ASPART 100 UNIT/ML ~~LOC~~ SOLN
1.0000 [IU] | SUBCUTANEOUS | Status: DC
  Administered 2018-06-25 (×2): 1 [IU] via SUBCUTANEOUS
  Administered 2018-06-25: 2 [IU] via SUBCUTANEOUS
  Administered 2018-06-26: 1 [IU] via SUBCUTANEOUS
  Administered 2018-06-26: 2 [IU] via SUBCUTANEOUS
  Administered 2018-06-26: 1 [IU] via SUBCUTANEOUS

## 2018-06-25 MED ORDER — NOREPINEPHRINE 4 MG/250ML-% IV SOLN
0.0000 ug/min | INTRAVENOUS | Status: DC
  Administered 2018-06-25: 10 ug/min via INTRAVENOUS
  Administered 2018-06-25: 2 ug/min via INTRAVENOUS
  Filled 2018-06-25: qty 250

## 2018-06-25 NOTE — ED Notes (Signed)
Attempted report 

## 2018-06-25 NOTE — H&P (Signed)
PULMONARY / CRITICAL CARE MEDICINE   NAME:  Grace [name], MRN:  [id number], DOB:  09/26/1955, LOS: 0 ADMISSION DATE:  2018-07-02, CONSULTATION DATE: 06/25/2018 REFERRING MD: Emergency room physician, CHIEF COMPLAINT: Cardiac arrest  BRIEF HISTORY:    Patient is known with chronic respiratory failure with tracheostomy at nursing home came in after cardiac arrest  HISTORY OF PRESENT ILLNESS   [name] is a 62 year old lady with multiple comorbidities including chronic respiratory failure status post tracheostomy, CVA, hypertension and diabetes mellitus resides in nursing home who was witnessed and agonal breathing 911 was called and on arrival patient was apneic in PEA.  ACLS was initiated and she had a total of around 5 doses of epinephrine.  Patient had a total of around 20 minutes of CPR.  Upon arrival to the emergency room tracheostomy was changed to cuffless trach. Patient had no family members and most of the history was taken from the chart and staff.  Patient was just recently discharged from the hospital after being admitted for dislodged tracheostomy.  SIGNIFICANT PAST MEDICAL HISTORY   She  has a past medical history of Acute respiratory failure (HCC), Dysphagia, Encephalopathy, H/O tracheostomy, Hypertension, Sepsis (HCC), and Stroke (HCC).   SIGNIFICANT EVENTS:  Cardiac arrest 06/25/2018  STUDIES:      CULTURES:     ANTIBIOTICS:  None  LINES/TUBES:   Tracheostomy Arterial line 06/25/2018   CONSULTANTS:    SUBJECTIVE:     CONSTITUTIONAL: BP (!) 145/84   Pulse (!) 144   Temp (!) 95.5 F (35.3 C)   Resp (!) 26   Ht 5\' 8"  (1.727 m) Comment: measured at bedside  Wt 92.7 kg   SpO2 99%   BMI 31.07 kg/m   No intake/output data recorded.     Vent Mode: PRVC FiO2 (%):  [50 %-100 %] 50 % Set Rate:  [20 bmp-26 bmp] 26 bmp Vt Set:  [510 mL] 510 mL PEEP:  [5 cmH20] 5 cmH20 Plateau Pressure:  [27 cmH20] 27 cmH20  PHYSICAL EXAM: General: Patient  acutely ill obtunded on mechanical ventilation through tracheostomy Neuro: Right pupil larger than left pupil sluggish patient is obtunded not following commands not moving any of her extremities not in any sedation HEENT: Tracheostomy midline Cardiovascular: Tachycardic no murmurs Lungs: Clear clear some bilateral crackles no wheezing Abdomen: Soft no tenderness or organomegaly Musculoskeletal: Mild lower limb edema Skin: No skin rash  RESOLVED PROBLEM LIST   ASSESSMENT AND PLAN   Assessment -Acute on chronic hypoxemic respiratory failure requiring mechanical ventilation via tracheostomy -Cardiac arrest suspecting respiratory cause to start with -Acute metabolic encephalopathy suspect anoxic brain injury -Acute metabolic and respiratory acidosis -Lactic acidosis -Acute kidney injury  Plan: -Stat CT head -Adjust mechanical ventilation keep pulse ox above 92% use lung protective strategy -Keep n.p.o. for now -I will hold off on starting any antibiotics no signs of infection -Repeat lactic acid -Start normal saline at 100 mL an hour -Follow urine output and renal function -Reassess neurologically for any signs of neurological recovery -Start insulin sliding scale -Patient is not requiring any pressors at the moment we will observe very closely in the intensive care unit   I have spent 40 minutes of critical care time this time was spent bedside or in the unit this time was exclusive any billable procedures patient is needing intensive care due to acute respiratory require mechanical ventilation post cardiac arrest   SUMMARY OF TODAY'S PLAN:  Keep off sedation and reassess mental status  Best Practice /  Goals of Care / Disposition.   DVT PROPHYLAXIS: SCDs SUP: PPI NUTRITION: N.p.o. MOBILITY: Bedrest GOALS OF CARE: Full code FAMILY DISCUSSIONS: No family around DISPOSITION ICU admission  LABS  Glucose Recent Labs  Lab 06/20/18 1118 06/20/18 1157 06/20/18 1508  06/20/18 1720 06/20/18 1917 06/20/18 2324  GLUCAP 205* 211* 138* 131* 112* 198*    BMET Recent Labs  Lab 06/18/18 1605 06/19/18 0256 06/17/2018 2344 06/25/18 0001  NA 139 136 140 141  K 5.4* 3.7 4.8 4.8  CL 102 99 103 104  CO2 32 29 26  --   BUN 14 8 17 19   CREATININE 0.87 0.80 1.36* 1.30*  GLUCOSE 133* 151* 265* 265*    Liver Enzymes Recent Labs  Lab 06/18/18 1605 07/01/2018 2344  AST 28 103*  ALT 20 79*  ALKPHOS 92 105  BILITOT 0.5 0.3  ALBUMIN 3.3* 3.1*    Electrolytes Recent Labs  Lab 06/18/18 1354 06/18/18 1605 06/19/18 0256 07/11/2018 2344  CALCIUM  --  9.8 9.4 9.3  MG  --  1.9  --   --   PHOS 4.6  --   --   --     CBC Recent Labs  Lab 06/18/18 1302  06/19/18 0256 06/25/2018 2344 06/25/18 0001  WBC 10.8*  --  10.3 12.7*  --   HGB 12.8   < > 11.3* 10.9* 13.3  HCT 44.7   < > 37.2 40.1 39.0  PLT 468*  --  366 419*  --    < > = values in this interval not displayed.    ABG Recent Labs  Lab 06/18/18 1432 06/19/18 0524 06/25/18 0043  PHART 7.372 7.399 7.281*  PCO2ART 62.2* 55.8* 59.2*  PO2ART 78.0* 79.0* 213.0*    Coag's Recent Labs  Lab 06/18/18 1354  INR 0.90    Sepsis Markers Recent Labs  Lab 06/25/18 0003  LATICACIDVEN 7.73*    Cardiac Enzymes Recent Labs  Lab 06/18/18 1354 06/18/18 2000 06/19/18 0256  TROPONINI <0.03 0.04* 0.03*    PAST MEDICAL HISTORY :   She  has a past medical history of Acute respiratory failure (HCC), Dysphagia, Encephalopathy, H/O tracheostomy, Hypertension, Sepsis (HCC), and Stroke (HCC).  PAST SURGICAL HISTORY:  She  has no past surgical history on file.  Allergies  Allergen Reactions  . Asa [Aspirin]     Unknown reaction per MAR  . Eggs Or Egg-Derived Products     Unknown reaction per MAR  . Fish Allergy     Unknown reaction per MAR  . Motrin [Ibuprofen]     Unknown reaction per MAR    No current facility-administered medications on file prior to encounter.    Current Outpatient  Medications on File Prior to Encounter  Medication Sig  . acetaminophen (TYLENOL) 325 MG tablet Take 650 mg by mouth 3 (three) times daily.   Marland Kitchen amLODipine (NORVASC) 10 MG tablet Take 1 tablet (10 mg total) by mouth daily.  Marland Kitchen atorvastatin (LIPITOR) 10 MG tablet Take 30 mg by mouth at bedtime.  . calcium-vitamin D (OSCAL WITH D) 500-200 MG-UNIT tablet Take 1 tablet by mouth 2 (two) times daily.  . cloNIDine (CATAPRES) 0.2 MG tablet Take 1 tablet (0.2 mg total) by mouth 3 (three) times daily.  . clopidogrel (PLAVIX) 75 MG tablet Take 75 mg by mouth at bedtime.   . DULoxetine (CYMBALTA) 30 MG capsule Take 30 mg by mouth daily.   . ferrous sulfate 325 (65 FE) MG EC tablet Take 325 mg by  mouth at bedtime.  . gabapentin (NEURONTIN) 300 MG capsule Take 300-600 mg by mouth See admin instructions. Take 300 mg in the morning and 600 mg in the evening  . hydrALAZINE (APRESOLINE) 100 MG tablet Take 1 tablet (100 mg total) by mouth 3 (three) times daily.  . insulin aspart (NOVOLOG) 100 UNIT/ML injection Inject 0-9 Units into the skin 3 (three) times daily with meals. (Patient not taking: Reported on 03/09/2018)  . insulin glargine (LANTUS) 100 UNIT/ML injection Inject 0.25 mLs (25 Units total) into the skin daily.  . isosorbide mononitrate (IMDUR) 30 MG 24 hr tablet Take 1 tablet (30 mg total) by mouth daily.  Marland Kitchen labetalol (NORMODYNE) 200 MG tablet Take 1 tablet (200 mg total) by mouth 3 (three) times daily.  . Maltodextrin-Xanthan Gum (RESOURCE THICKENUP CLEAR) POWD Take 120 g by mouth as needed. (Patient not taking: Reported on 01/24/2018)  . montelukast (SINGULAIR) 10 MG tablet Take 10 mg by mouth at bedtime.  . nitroGLYCERIN (NITROSTAT) 0.4 MG SL tablet Place 0.4 mg under the tongue every 5 (five) minutes as needed for chest pain.  . pantoprazole sodium (PROTONIX) 40 mg/20 mL PACK Take 20 mLs (40 mg total) by mouth daily at 12 noon.  . polyethylene glycol (MIRALAX / GLYCOLAX) packet Take 17 g by mouth daily  as needed for mild constipation or moderate constipation.  . Vitamin D, Ergocalciferol, (DRISDOL) 50000 units CAPS capsule Take 50,000 Units by mouth every 30 (thirty) days. Takes 17th of each Month    FAMILY HISTORY:   Her family history is not on file.  SOCIAL HISTORY:  She  reports that she has quit smoking. Her smoking use included cigarettes. She has never used smokeless tobacco. She reports that she drank alcohol. She reports that she has current or past drug history.  REVIEW OF SYSTEMS:    Could not be obtained due to patient altered mental status

## 2018-06-25 NOTE — ED Provider Notes (Addendum)
[facility] EMERGENCY DEPARTMENT Provider Note   CSN: [id number] Arrival date & time: 06/21/2018  2336     History   Chief Complaint Chief Complaint  Patient presents with  . Cardiac Arrest    HPI Jen Benedict is a 62 y.o. female.  Patient brought to the emergency department by ambulance for cardiopulmonary arrest.  Patient is a nursing home patient, chronic trach dependent.  Nursing home staff report that they checked on her and she was visibly having difficulty breathing with agonal respirations.  They called 911.  EMS report that the patient was apneic and in PEA at 30 bpm upon their arrival.  They initiated ACLS.  Patient had CPR performed, was bagged via trach.  She received a total of 5 doses of epinephrine and ultimately achieved ROSC.  Patient was given 5 doses of epinephrine during ACLS.  EMS prepared an epinephrine drip, but did not have time to hang it before they got to the hospital.  EMS report a total of 20 minutes of CPR.     Past Medical History:  Diagnosis Date  . Acute respiratory failure (HCC)   . Dysphagia   . Encephalopathy   . H/O tracheostomy   . Hypertension   . Sepsis (HCC)   . Stroke [facility])    Left sided weakness     Patient Active Problem List   Diagnosis Date Noted  . Acute respiratory failure (HCC) 06/18/2018  . Tracheostomy status (HCC)   . Acute encephalopathy   . Acute respiratory failure with hypoxemia (HCC)   . Hypertensive emergency   . Hypoxia   . Sepsis (HCC) 11/07/2017    History reviewed. No pertinent surgical history.   OB History   None      Home Medications    Prior to Admission medications   Medication Sig Start Date End Date Taking? Authorizing Provider  acetaminophen (TYLENOL) 325 MG tablet Take 650 mg by mouth 3 (three) times daily.     [provider]  amLODipine (NORVASC) 10 MG tablet Take 1 tablet (10 mg total) by mouth daily. 12/01/17   [name], MD  atorvastatin  (LIPITOR) 10 MG tablet Take 30 mg by mouth at bedtime.    [provider]  calcium-vitamin D (OSCAL WITH D) 500-200 MG-UNIT tablet Take 1 tablet by mouth 2 (two) times daily.    [provider]  cloNIDine (CATAPRES) 0.2 MG tablet Take 1 tablet (0.2 mg total) by mouth 3 (three) times daily. 11/30/17   [name], MD  clopidogrel (PLAVIX) 75 MG tablet Take 75 mg by mouth at bedtime.     [provider]  DULoxetine (CYMBALTA) 30 MG capsule Take 30 mg by mouth daily.     [provider]  ferrous sulfate 325 (65 FE) MG EC tablet Take 325 mg by mouth at bedtime.    [provider]  gabapentin (NEURONTIN) 300 MG capsule Take 300-600 mg by mouth See admin instructions. Take 300 mg in the morning and 600 mg in the evening    [provider]  hydrALAZINE (APRESOLINE) 100 MG tablet Take 1 tablet (100 mg total) by mouth 3 (three) times daily. 11/30/17   [name], MD  insulin aspart (NOVOLOG) 100 UNIT/ML injection Inject 0-9 Units into the skin 3 (three) times daily with meals. Patient not taking: Reported on 03/09/2018 11/30/17   [name], MD  insulin glargine (LANTUS) 100 UNIT/ML injection Inject 0.25 mLs (25 Units total) into the skin daily. 12/01/17  [name], MD  isosorbide mononitrate (IMDUR) 30 MG 24 hr tablet Take 1 tablet (30 mg total) by mouth daily. 11/30/17 11/30/18  [name], MD  labetalol (NORMODYNE) 200 MG tablet Take 1 tablet (200 mg total) by mouth 3 (three) times daily. 11/30/17   [name], MD  Maltodextrin-Xanthan Gum (RESOURCE THICKENUP CLEAR) POWD Take 120 g by mouth as needed. Patient not taking: Reported on 01/24/2018 11/30/17   [name], MD  montelukast (SINGULAIR) 10 MG tablet Take 10 mg by mouth at bedtime.    [provider]  nitroGLYCERIN (NITROSTAT) 0.4 MG SL tablet Place 0.4 mg under the tongue every 5 (five) minutes as needed for chest pain.    [provider]    pantoprazole sodium (PROTONIX) 40 mg/20 mL PACK Take 20 mLs (40 mg total) by mouth daily at 12 noon. 12/01/17   [name], MD  polyethylene glycol (MIRALAX / GLYCOLAX) packet Take 17 g by mouth daily as needed for mild constipation or moderate constipation.    [provider]  Vitamin D, Ergocalciferol, (DRISDOL) 50000 units CAPS capsule Take 50,000 Units by mouth every 30 (thirty) days. Takes 17th of each Month    [provider]    Family History No family history on file.  Social History Social History   Tobacco Use  . Smoking status: Former Smoker    Types: Cigarettes  . Smokeless tobacco: Never Used  Substance Use Topics  . Alcohol use: Not Currently  . Drug use: Not Currently     Allergies   Asa [aspirin]; Eggs or egg-derived products; Fish allergy; and Motrin [ibuprofen]   Review of Systems Review of Systems  Unable to perform ROS: Acuity of condition     Physical Exam Updated Vital Signs BP (!) 146/87   Pulse (!) 145   Temp (!) 96.1 F (35.6 C)   Resp (!) 21   Ht 5\' 8"  (1.727 m) Comment: measured at bedside  Wt 92.7 kg   SpO2 100%   BMI 31.07 kg/m   Physical Exam  Constitutional: She appears well-developed and well-nourished.  HENT:  Head: Atraumatic.  Eyes:  Pupils are 4 mm bilaterally, nonreactive  Cardiovascular: Normal rate and regular rhythm.  Pulmonary/Chest:  Sounds are coarse bilaterally but equal, bagged via trach  Musculoskeletal: She exhibits no edema or deformity.  Neurological:  Unresponsive  Skin: Skin is warm and dry. No rash noted.  Psychiatric: She is noncommunicative.     ED Treatments / Results  Labs (all labs ordered are listed, but only abnormal results are displayed) Labs Reviewed  CBC - Abnormal; Notable for the following components:      Result Value   WBC 12.7 (*)    Hemoglobin 10.9 (*)    MCH 24.4 (*)    MCHC 27.2 (*)    RDW 17.7 (*)    Platelets 419 (*)    nRBC 0.4 (*)    All other  components within normal limits  COMPREHENSIVE METABOLIC PANEL - Abnormal; Notable for the following components:   Glucose, Bld 265 (*)    Creatinine, Ser 1.36 (*)    Albumin 3.1 (*)    AST 103 (*)    ALT 79 (*)    GFR calc non Af Amer 40 (*)    GFR calc Af Amer 47 (*)    All other components within normal limits  I-STAT CHEM 8, ED - Abnormal; Notable for the following components:   Creatinine, Ser 1.30 (*)  Glucose, Bld 265 (*)    All other components within normal limits  I-STAT CG4 LACTIC ACID, ED - Abnormal; Notable for the following components:   Lactic Acid, Venous 7.73 (*)    All other components within normal limits  I-STAT ARTERIAL BLOOD GAS, ED - Abnormal; Notable for the following components:   pH, Arterial 7.281 (*)    pCO2 arterial 59.2 (*)    pO2, Arterial 213.0 (*)    All other components within normal limits  URINE CULTURE  URINALYSIS, ROUTINE W REFLEX MICROSCOPIC  I-STAT TROPONIN, ED    EKG EKG Interpretation  Date/Time:  Saturday June 25 2018 00:47:38 EST Ventricular Rate:  145 PR Interval:    QRS Duration: 97 QT Interval:  382 QTC Calculation: 594 R Axis:   26 Text Interpretation:  Sinus tachycardia Multiform ventricular premature complexes Minimal ST depression, lateral leads Prolonged QT interval Baseline wander in lead(s) V3 Confirmed by Gilda Crease [phone]) on 06/25/2018 1:11:25 AM   Radiology No results found.  Procedures TRACHEOSTOMY REPLACEMENT Date/Time: 06/25/2018 12:55 AM Performed by: Gilda Crease, MD Authorized by: Gilda Crease, MD  Consent: The procedure was performed in an emergent situation. Required items: required blood products, implants, devices, and special equipment available Patient identity confirmed: hospital-assigned identification number Time out: Immediately prior to procedure a "time out" was called to verify the correct patient, procedure, equipment, support staff and site/side marked  as required. Tracheostomy replacement indications: Needs cuffed trach for ventilator. Local anesthesia used: no  Anesthesia: Local anesthesia used: no  Sedation: Patient sedated: no  Preparation: Patient was prepped and draped in the usual sterile fashion. Tube cuff: single cuff Tube size: 4.0 mm Cuff inflation: inflated Cuff type: air Cuff inflation technique: minimal leak technique used Patient tolerance: Patient tolerated the procedure well with no immediate complications  .Central Line Date/Time: 06/25/2018 12:57 AM Performed by: Gilda Crease, MD Authorized by: Gilda Crease, MD   Consent:    Consent obtained:  Emergent situation Universal protocol:    Patient identity confirmed:  Hospital-assigned identification number Pre-procedure details:    Hand hygiene: Hand hygiene performed prior to insertion     Sterile barrier technique: All elements of maximal sterile technique followed     Skin preparation:  2% chlorhexidine   Skin preparation agent: Skin preparation agent completely dried prior to procedure   Anesthesia (see MAR for exact dosages):    Anesthesia method:  Local infiltration   Local anesthetic:  Lidocaine 1% w/o epi Procedure details:    Location:  R femoral   Site selection rationale:  Has a trach with trach strap   Patient position:  Flat   Procedural supplies:  Triple lumen   Ultrasound guidance: yes     Number of attempts:  3   Successful placement: yes   Post-procedure details:    Assessment:  Blood return through all ports ARTERIAL LINE Date/Time: 06/25/2018 1:06 AM Performed by: Gilda Crease, MD Authorized by: Gilda Crease, MD   Consent:    Consent obtained:  Emergent situation Indications:    Indications: hemodynamic monitoring and multiple ABGs   Pre-procedure details:    Skin preparation:  2% Chlorhexidine   Preparation: Patient was prepped and draped in sterile fashion   Anesthesia (see MAR for  exact dosages):    Anesthesia method:  Local infiltration   Local anesthetic:  Lidocaine 1% w/o epi Procedure details:    Location:  R femoral   Placement technique:  Seldinger  Number of attempts:  1   Transducer: waveform confirmed   Post-procedure details:    Post-procedure:  Biopatch applied and sutured   CMS:  Normal   (including critical care time)  Medications Ordered in ED Medications  phenylephrine 0.4-0.9 MG/10ML-% injection (40 mcg  Given 07-07-18 2345)  norepinephrine (LEVOPHED) 4mg  in D5W premix infusion (0 mcg/min Intravenous Stopped 06/25/18 0011)  0.9 %  sodium chloride infusion ( Intravenous Stopped 06/25/18 0011)     Initial Impression / Assessment and Plan / ED Course  I have reviewed the triage vital signs and the nursing notes.  Pertinent labs & imaging results that were available during my care of the patient were reviewed by me and considered in my medical decision making (see chart for details).     Patient arrived via EMS from nursing home.  Patient received 20 minutes of CPR prior to being brought to the ER.  Patient was found with agonal respirations and no pulses, found to be in PEA by EMS.  ACLS was followed and patient received a total of 20 minutes of CPR before [place].  At arrival to the ER, patient is still unresponsive, but does have spontaneous circulation.  Patient found to have a cuffless trach in place.  This was replaced by myself with a 4 cuffed trach and then the patient was adequately ventilated.  Patient only had an IO for access at arrival.  Nursing staff initiated right EJ catheter and then I placed a right femoral vein triple-lumen catheter.  I did also cannulate the femoral artery during procedure, placed an art line.  Patient was initiated on levo fed at arrival for severe hypotension.  She was also given a single dose of Neo-Synephrine bolus.  Blood pressure improved and she actually became hypertensive, was weaned off of the  Levophed.  Discussed with on-call critical care, will send team for evaluation.  CRITICAL CARE Performed by: Gilda Crease   Total critical care time: 40 minutes  Critical care time was exclusive of separately billable procedures and treating other patients.  Critical care was necessary to treat or prevent imminent or life-threatening deterioration.  Critical care was time spent personally by me on the following activities: development of treatment plan with patient and/or surrogate as well as nursing, discussions with consultants, evaluation of patient's response to treatment, examination of patient, obtaining history from patient or surrogate, ordering and performing treatments and interventions, ordering and review of laboratory studies, ordering and review of radiographic studies, pulse oximetry and re-evaluation of patient's condition.   Final Clinical Impressions(s) / ED Diagnoses   Final diagnoses:  Cardiopulmonary arrest [facility])    ED Discharge Orders    None       Blinda Leatherwood Canary Brim, MD 06/25/18 0109    Gilda Crease, MD 06/25/18 0111

## 2018-06-25 NOTE — Progress Notes (Signed)
RT NOTE:  Pt transported to 2M04 without event. Report given to [place], RRT.

## 2018-06-25 NOTE — Progress Notes (Signed)
Pt transported to CT and back to 2M04 with no complications.

## 2018-06-25 NOTE — Progress Notes (Signed)
Nutrition Brief Note  Chart reviewed due to ventilator requirement. Noted grim prognosis. MD to discuss test results with husband. No plans to initiate nutrition support at this time.  No nutrition intervention indicated at this time. Please order the adult tube feeding protocol and consult RD if plans to begin TF.   Joaquin Courts, RD, LDN, CNSC Pager [phone] After Hours Pager [phone]

## 2018-06-25 NOTE — Progress Notes (Addendum)
PCCM interval note  CT head reviewed with diffuse swelling, anoxic injury after cardiac arrest Prognosis is grim with little chance of meaningful neurologic recovery.  I called and discussed with husband Grace [name] over the phone Recommended DNR status and he agrees He lives over 100 miles away and is making arrangements to try and come to the hospital.  The patient is critically ill with multiple organ system failure and requires high complexity decision making for assessment and support, frequent evaluation and titration of therapies, advanced monitoring, review of radiographic studies and interpretation of complex data.   Critical Care Time devoted to patient care services, exclusive of separately billable procedures, described in this note is 35 minutes.   Chilton Greathouse MD [facility] Pager [phone] If no answer or after 3pm call: [phone] 06/25/2018, 4:38 PM

## 2018-06-25 NOTE — Progress Notes (Signed)
eLink Physician-Brief Progress Note Patient Name: Grace [name] DOB: 12/13/1955 MRN: [id number]   Date of Service  06/25/2018  HPI/Events of Note  Chronic respiratory failure s/p trach found in agonal respiration at nursing home and eventually became apneic. ACLS ensued with ROSC after 5 rounds of epi.  Reportedly trach was changed less than an hour prior to event. Now tachycardic in the 150s, appears sinus on telemetry.  eICU Interventions  Gave 1 dose of metoprolol 5 mg IV with HR down to 117, BP maintained.  Proceeding to radio for head CT.     Intervention Category Major Interventions: Arrhythmia - evaluation and management;Airway management Evaluation Type: New Patient Evaluation  Darl Pikes 06/25/2018, 6:32 AM

## 2018-06-25 NOTE — ED Notes (Signed)
Dr Blinda Leatherwood informed of lactic acid results 7.73

## 2018-06-25 NOTE — ED Notes (Signed)
RN Delorise Jackson informed of lactic acid results 2.76

## 2018-06-26 ENCOUNTER — Inpatient Hospital Stay (HOSPITAL_COMMUNITY): Payer: Medicaid Other

## 2018-06-26 DIAGNOSIS — I469 Cardiac arrest, cause unspecified: Principal | ICD-10-CM

## 2018-06-26 DIAGNOSIS — G931 Anoxic brain damage, not elsewhere classified: Secondary | ICD-10-CM

## 2018-06-26 LAB — GLUCOSE, CAPILLARY
GLUCOSE-CAPILLARY: 133 mg/dL — AB (ref 70–99)
GLUCOSE-CAPILLARY: 166 mg/dL — AB (ref 70–99)
Glucose-Capillary: 102 mg/dL — ABNORMAL HIGH (ref 70–99)
Glucose-Capillary: 132 mg/dL — ABNORMAL HIGH (ref 70–99)

## 2018-06-26 LAB — URINE CULTURE: CULTURE: NO GROWTH

## 2018-06-26 LAB — POCT I-STAT 3, ART BLOOD GAS (G3+)
ACID-BASE DEFICIT: 4 mmol/L — AB (ref 0.0–2.0)
Bicarbonate: 17.4 mmol/L — ABNORMAL LOW (ref 20.0–28.0)
O2 SAT: 98 %
Patient temperature: 36.8
TCO2: 18 mmol/L — AB (ref 22–32)
pCO2 arterial: 21.1 mmHg — ABNORMAL LOW (ref 32.0–48.0)
pH, Arterial: 7.523 — ABNORMAL HIGH (ref 7.350–7.450)
pO2, Arterial: 89 mmHg (ref 83.0–108.0)

## 2018-06-26 LAB — BASIC METABOLIC PANEL
Anion gap: 12 (ref 5–15)
BUN: 36 mg/dL — AB (ref 8–23)
CO2: 19 mmol/L — ABNORMAL LOW (ref 22–32)
CREATININE: 2.93 mg/dL — AB (ref 0.44–1.00)
Calcium: 8.8 mg/dL — ABNORMAL LOW (ref 8.9–10.3)
Chloride: 113 mmol/L — ABNORMAL HIGH (ref 98–111)
GFR calc Af Amer: 19 mL/min — ABNORMAL LOW (ref >60)
GFR calc non Af Amer: 16 mL/min — ABNORMAL LOW (ref >60)
GLUCOSE: 156 mg/dL — AB (ref 70–99)
Potassium: 3.2 mmol/L — ABNORMAL LOW (ref 3.5–5.1)
SODIUM: 144 mmol/L (ref 135–145)

## 2018-06-26 LAB — LACTIC ACID, PLASMA: LACTIC ACID, VENOUS: 2 mmol/L — AB (ref 0.5–1.9)

## 2018-06-26 MED ORDER — ENOXAPARIN SODIUM 30 MG/0.3ML ~~LOC~~ SOLN: 30.0000 mg | SUBCUTANEOUS | Status: DC

## 2018-06-26 MED ORDER — POTASSIUM CHLORIDE 20 MEQ/15ML (10%) PO SOLN
40.0000 meq | Freq: Once | ORAL | Status: AC
  Administered 2018-06-26: 40 meq
  Filled 2018-06-26: qty 30

## 2018-06-26 MED ORDER — IPRATROPIUM-ALBUTEROL 0.5-2.5 (3) MG/3ML IN SOLN: 3.0000 mL | Freq: Four times a day (QID) | RESPIRATORY_TRACT | Status: DC | PRN

## 2018-06-26 MED ORDER — ENOXAPARIN SODIUM 40 MG/0.4ML ~~LOC~~ SOLN
40.0000 mg | SUBCUTANEOUS | Status: DC
  Administered 2018-06-26: 40 mg via SUBCUTANEOUS
  Filled 2018-06-26: qty 0.4

## 2018-06-26 MED ORDER — FAMOTIDINE IN NACL 20-0.9 MG/50ML-% IV SOLN: 20.0000 mg | INTRAVENOUS | Status: DC

## 2018-06-27 LAB — CBC
HEMATOCRIT: 34.2 % — AB (ref 36.0–46.0)
Hemoglobin: 10.5 g/dL — ABNORMAL LOW (ref 12.0–15.0)
MCH: 25.2 pg — ABNORMAL LOW (ref 26.0–34.0)
MCHC: 30.7 g/dL (ref 30.0–36.0)
MCV: 82 fL (ref 80.0–100.0)
Platelets: 357 10*3/uL (ref 150–400)
RBC: 4.17 MIL/uL (ref 3.87–5.11)
RDW: 17.6 % — AB (ref 11.5–15.5)
WBC: 13.9 10*3/uL — AB (ref 4.0–10.5)
nRBC: 0.4 % — ABNORMAL HIGH (ref 0.0–0.2)

## 2018-06-29 ENCOUNTER — Telehealth: Payer: Self-pay

## 2018-06-29 NOTE — Telephone Encounter (Signed)
On 06/29/18 I received a [facility]c [facility] ([facility]ed[facility] DC is for cremation.  Patient is a patient of Doctor Mannam.  DC will be taken to Pulmonary Unit for signature.  On 07/01/18 I received the d/c back from Doctor Mannam . I got the d/c ready and faxed the d/c to the funeral home per the funeral home request.

## 2018-07-06 ENCOUNTER — Telehealth: Payer: Self-pay

## 2018-07-06 NOTE — Telephone Encounter (Signed)
On 07/06/18 I [facility] DC is for cremation.  Patient is a patient of Doctor Mannam.  DC will be taken to Pulmonary Unit for signature.  On 07/20/18 I received the d/c back from Doctor Mannam. I got the dc ready and called the funeral home to let them know the dc was mailed to funeral home per the funeral home request.

## 2018-07-17 NOTE — Consult Note (Addendum)
Neurology Consultation  Reason for Consult: Anoxic brain injury, prognositcation Referring Physician: Dr Vaughan Browner  CC: Post cardiac arrest anoxic brain injury  History is obtained from: Chart  HPI: Mariha Sleeper is a 62 y.o. 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 Her clinical exam has been very grim neurologically and brain imaging-noncontrast CT of the head-shows diffuse hypoxic anoxic cerebral edema. Neurology service was consulted for anoxic brain injury and prognostic agent.   ROS: Unable to obtain due to altered mental status.   Past Medical History:  Diagnosis Date  . Acute respiratory failure ([place])   . Dysphagia   . Encephalopathy   . H/O tracheostomy   . Hypertension   . Sepsis ([place])   . Stroke [facility])    Left sided weakness    No family history on file.   Social History:   reports that she has quit smoking. Her smoking use included cigarettes. She has never used smokeless tobacco. She reports that she drank alcohol. She reports that she has current or past drug history.  Medications  Current Facility-Administered Medications:  .  0.9 %  sodium chloride infusion, , Intravenous, Continuous, Mannam, Praveen, MD, Last Rate: 10 mL/hr at Jun 28, 2018 0815 .  chlorhexidine gluconate (MEDLINE KIT) (PERIDEX) 0.12 % solution 15 mL, 15 mL, Mouth Rinse, BID, Mannam, Praveen, MD, 15 mL at 06/28/2018 0734 .  [START ON 06/27/2018] enoxaparin (LOVENOX) injection 30 mg, 30 mg, Subcutaneous, Q24H, Rumbarger, Valeda Malm, RPH .  famotidine (PEPCID) IVPB 20 mg premix, 20 mg, Intravenous, Q24H, Rumbarger, Valeda Malm, RPH .  fentaNYL (SUBLIMAZE) injection 100 mcg, 100 mcg, Intravenous, Q15 min PRN,  Aljishi, Wael Z, MD .  fentaNYL (SUBLIMAZE) injection 100 mcg, 100 mcg, Intravenous, Q2H PRN, Aljishi, Wael Z, MD .  insulin aspart (novoLOG) injection 1-3 Units, 1-3 Units, Subcutaneous, Q4H, Aldean Jewett, MD, 1 Units at 2018-06-28 0734 .  ipratropium-albuterol (DUONEB) 0.5-2.5 (3) MG/3ML nebulizer solution 3 mL, 3 mL, Nebulization, Q6H, Aljishi, Virgina Norfolk, MD, 3 mL at 2018-06-28 0722 .  MEDLINE mouth rinse, 15 mL, Mouth Rinse, 10 times per day, Mannam, Praveen, MD, 15 mL at 06/28/2018 0942 .  metoprolol tartrate (LOPRESSOR) injection 5 mg, 5 mg, Intravenous, Once PRN, Genevive Bi, Shona Needles, MD .  midazolam (VERSED) injection 2 mg, 2 mg, Intravenous, Q15 min PRN, Aljishi, Virgina Norfolk, MD .  midazolam (VERSED) injection 2 mg, 2 mg, Intravenous, Q2H PRN, Aljishi, Virgina Norfolk, MD .  norepinephrine (LEVOPHED) 108m in D5W 2547mpremix infusion, 0-40 mcg/min, Intravenous, Titrated, Mannam, Praveen, MD, Stopped at 06/25/18 1439  Exam: Current vital signs: BP (!) 66/49   Pulse (!) 114   Temp 98.4 F (36.9 C)   Resp 14   Ht _0  (1.651 m)   Wt 89.6 kg   SpO2 100%   BMI 32.87 kg/m  Vital signs in last 24 hours: Temp:  [96.6 F (35.9 C)-99.3 F (37.4 C)] 98.4 F (36.9 C) (11/10 0900) Pulse Rate:  [94-114] 114 (11/10 0900) Resp:  [14-26] 14 (11/10 0900) BP: (66-122)/(49-82) 66/49 (11/10 0900) SpO2:  [97 %-100 %] 100 % (11/10 0900) Arterial Line BP: (83-138)/(52-80) 102/59 (11/10 0900) FiO2 (%):  [40 %] 40 % (11/10 0750) Weight:  [89.6 kg] 89.6 kg (11/10 0119) General: On no sedation,  intubated and ventilated. HEENT: Normocephalic atraumatic dry oral mucous membranes Lungs: Clear to auscultation Abdomen: Nondistended, nontender Extremities: Warm well perfused with trace edema bilaterally Neurological exam Patient is not on any sedation, intubated and ventilated.  No spontaneous movements noted. She is breathing with the ventilator. Cranial nerves: Her pupils-right pupil 7 mm dilated nonreactive to light,  left pupil 6 cm dilated nonreactive to light, absent corneal reflexes, absent oculocephalics, facial symmetry difficult to ascertain. Motor exam: No spontaneous movements.  No movement to noxious stimulation. Sensory exam: As above Coordination: Cannot be assessed Gait testing cannot be done  Labs I have reviewed labs in epic and the results pertinent to this consultation are: CBC    Component Value Date/Time   WBC 13.9 (H) Jul 15, 2018 0941   RBC 4.17 2018/07/15 0941   HGB 10.5 (L) 07-15-18 0941   HCT 34.2 (L) 15-Jul-2018 0941   PLT 357 2018-07-15 0941   MCV 82.0 07/15/18 0941   MCH 25.2 (L) 07-15-2018 0941   MCHC 30.7 07-15-2018 0941   RDW 17.6 (H) 15-Jul-2018 0941   LYMPHSABS 5.8 (H) 06/18/2018 1302   MONOABS 1.0 06/18/2018 1302   EOSABS 0.1 06/18/2018 1302   BASOSABS 0.1 06/18/2018 1302   CMP     Component Value Date/Time   NA 144 07-15-2018 0842   K 3.2 (L) 07/15/18 0842   CL 113 (H) 2018/07/15 0842   CO2 19 (L) 15-Jul-2018 0842   GLUCOSE 156 (H) 2018-07-15 0842   BUN 36 (H) 2018/07/15 0842   CREATININE 2.93 (H) Jul 15, 2018 0842   CALCIUM 8.8 (L) 15-Jul-2018 0842   PROT 7.4 07/10/2018 2344   ALBUMIN 3.1 (L) 07/13/2018 2344   AST 103 (H) 07/07/2018 2344   ALT 79 (H) 06/19/2018 2344   ALKPHOS 105 07/15/2018 2344   BILITOT 0.3 06/18/2018 2344   GFRNONAA 16 (L) 2018/07/15 0842   GFRAA 19 (L) 07-15-18 0842   Imaging I have reviewed the images obtained: CT-scan of the brain-diffuse cerebral edema with complete loss of gray-white differentiation along with pseudo-subarachnoid sign consistent with hypoxic anoxic brain damage.  Assessment:  62 year old woman with extensive past medical history including that of old stroke with residual left-sided weakness, hypertension, acute respiratory failure status post tracheostomy who was brought into the hospital after an out of hospital cardiac arrest with about 20 minutes of downtime prior to return of spontaneous circulation  has an exam today that is consistent with brain death with absence of brainstem reflexes and fixed dilated pupils. Her noncontrast CT of the head also shows diffuse cerebral edema with complete loss of gray-white differentiation along with pseudo-subarachnoid sign pattern that is consistent with hypoxic anoxic brain injury.  Impression: Clinical exam consistent with brain death. Cardiac arrest anoxic brain injury  Recommendations: Patient's current clinical exam is consistent with brain death. I discussed this in detail with the family- niece at bedside. Ancillary testing can be performed if desired by the primary team but I am not sure if that will add anything to the already available clinical exam and imaging findings. More family is on the way.  I will be available to speak with them and explained my exam findings and imaging findings, if desired by the primary team. Please call with questions.   CRITICAL CARE ATTESTATION This patient is critically ill and at significant risk of neurological worsening, death and care requires constant monitoring of vital signs, hemodynamics, respiratory, and cardiac monitoring. I spent 35 minutes of neurocritical care time performing neurological assessment, discussion with  family, other specialists and medical decision making of high complexity in the care of  this patient.   -- Amie Portland, MD [facility] Pager: [phone] If 7pm to 7am, please call on call as listed on AMION.

## 2018-07-17 NOTE — Progress Notes (Signed)
Spoke with family including husband in detail, explained exam c/w brain death and answered their questoions They would like life support removed I have informed Dr. Isaiah Serge.  -- Milon Dikes, MD [facility] Pager: [phone] If 7pm to 7am, please call on call as listed on AMION.

## 2018-07-17 NOTE — Progress Notes (Signed)
Patient asystole on the monitor. Heart and breath sounds absent. Pupils fixed and dilated. Family at bedside. CDS notified.   Verified by Ardith Dark, RN.

## 2018-07-17 NOTE — Discharge Summary (Signed)
Physician Death Summary  Patient ID: Grace [name] MRN: [id number] DOB/AGE: 1956-01-15 62 y.o.  Admit date: 06/19/2018 Discharge date: 24-Nov-2017  Admission Diagnoses: Cardiac arrest  Discharge Diagnoses:  Brain death Anoxic brain injury Cardiac arrest  Discharged Condition: Deceased  Hospital Course:  62 Y/O with chronic respiratory failure with tracheostomy at nursing home came in after cardiac arrest  Patient was just recently discharged from the hospital after being admitted for dislodged tracheostomy.  At the nursing home noted to have agonal breathing 911 was called and on arrival patient was apneic in PEA.  ACLS was initiated and she had a total of around 5 doses of epinephrine.  Patient had a total of around 20 minutes of CPR. Reportedly trach was [facility] less than an hour [facility] In ED femoral central line, A-line placed.  #6 cuffless trach changed to #4 cuffed  She had a CT scan with diffuse swelling, anoxic injury after cardiac arrest.  Neurology was consulted who felt examination and repeat CT, EEG was consistent with brain death.  Family was updated in detail at bedside and they wished to stop life support.  She passed away on Mar 21, 2018.  Consults: Neurology  Signed: Chilton GreathousePraveen Chidera Dearcos 06/29/2018, 1:37 PM

## 2018-07-17 NOTE — Progress Notes (Addendum)
PULMONARY / CRITICAL CARE MEDICINE   NAME:  Grace [name], MRN:  [id number], DOB:  [birth year], LOS: 1 ADMISSION DATE:  08-Jul-2018, CONSULTATION DATE: 06/25/2018 REFERRING MD: Emergency room physician, CHIEF COMPLAINT: Cardiac arrest  BRIEF HISTORY:    Chronic respiratory failure with tracheostomy at nursing home came in after cardiac arrest  Patient was just recently discharged from the hospital after being admitted for dislodged tracheostomy.  At the nursing home noted to have agonal breathing 911 was called and on arrival patient was apneic in PEA.  ACLS was initiated and she had a total of around 5 doses of epinephrine.  Patient had a total of around 20 minutes of CPR. Reportedly trach was changed less than an hour prior to event at [facility]  In ED femoral central line, A-line placed.  #6 cuffless trach changed to #4 cuffed  SIGNIFICANT PAST MEDICAL HISTORY   She  has a past medical history of Acute respiratory failure (HCC), Dysphagia, Encephalopathy, H/O tracheostomy, Hypertension, Sepsis ([facility]), and Stroke ([facility]).  SIGNIFICANT EVENTS:  11/9- Cardiac arrest  STUDIES:   CT head 06/25/2018- widespread hypoxic ischemic insult with cerebral edema.  No midline shift or herniation noted.   CULTURES:    ANTIBIOTICS:  None  LINES/TUBES:  Tracheostomy Rt fem a line 11/9 > Rt fem CVL 11/9 >  CONSULTANTS:    SUBJECTIVE:   CONSTITUTIONAL: BP (!) 95/58   Pulse (!) 113   Temp 98.3 F (36.8 C) (Axillary)   Resp (!) 26   Ht 5\' 5"  (1.651 m)   Wt 89.6 kg   SpO2 99%   BMI 32.87 kg/m   I/O last 3 completed shifts: In: 3314 [I.V.:3314] Out: 1775 [Urine:1775]     Vent Mode: PRVC FiO2 (%):  [40 %] 40 % Set Rate:  [26 bmp] 26 bmp Vt Set:  [510 mL] 510 mL PEEP:  [5 cmH20] 5 cmH20 Plateau Pressure:  [21 cmH20-24 cmH20] 24 cmH20  PHYSICAL EXAM: Gen:      No acute distress HEENT:  EOMI, sclera anicteric Neck:     No masses; no thyromegaly tracheostomy in place.  #4 cuffed  Shiley. Lungs:    Clear to auscultation bilaterally; normal respiratory effort CV:         Regular rate and rhythm; no murmurs Abd:      + bowel sounds; soft, non-tender; no palpable masses, no distension Ext:    No edema; adequate peripheral perfusion Skin:      Warm and dry; no rash Neuro: Obtunded, not following commands.  RESOLVED PROBLEM LIST   ASSESSMENT AND PLAN   Cardiac arrest, unclear etiology.  Likely from respiratory issues CT with signs of anoxic injury Continue neuro monitoring We will need to discuss with family about prognosis.  Acute on chronic respiratory failure Continue vent support Follow chest x-ray, ABG Observe off antibiotics for now  Shock Resolved.  Currently off Levophed Check repeat lactic acid..  AKI Monitor urine output and creatinine  Goals of care CT head reviewed with diffuse swelling, anoxic injury after cardiac arrest Prognosis is grim with little chance of meaningful neurologic recovery.  I called on 11/10 and discussed with husband Avya Flavell over the phone Recommended DNR status and he agrees He lives over 100 miles away and is making arrangements to try and come to the hospital.  SUMMARY OF TODAY'S PLAN:  Keep off sedation.  Monitor mental status Awaiting husband to arrive  Best Practice / Goals of Care / Disposition.   DVT PROPHYLAXIS: SCDs,  start lovenox SUP: pepcid NUTRITION: N.p.o. MOBILITY: Bedrest GOALS OF CARE: DNR FAMILY DISCUSSIONS: No family around DISPOSITION ICU admission  The patient is critically ill with multiple organ system failure and requires high complexity decision making for assessment and support, frequent evaluation and titration of therapies, advanced monitoring, review of radiographic studies and interpretation of complex data.   Critical Care Time devoted to patient care services, exclusive of separately billable procedures, described in this note is 35 minutes.   Chilton Greathouse MD [place]  Pulmonary and Critical Care Pager [phone] If no answer or after 3pm call: [phone] 02-Jul-2018, 8:09 AM

## 2018-07-17 NOTE — Progress Notes (Signed)
RT note-JPatient has been made comfort care. Orders to remove ventilator.

## 2018-07-17 DEATH — deceased

## 2019-08-03 IMAGING — CR DG CHEST 2V
2 series · 2 of 2 positions shown · non-contrast
Comparison: None.

CLINICAL DATA: Abdominal pain and shortness of Breath

EXAM:
CHEST - 2 VIEW

[w chest lat]
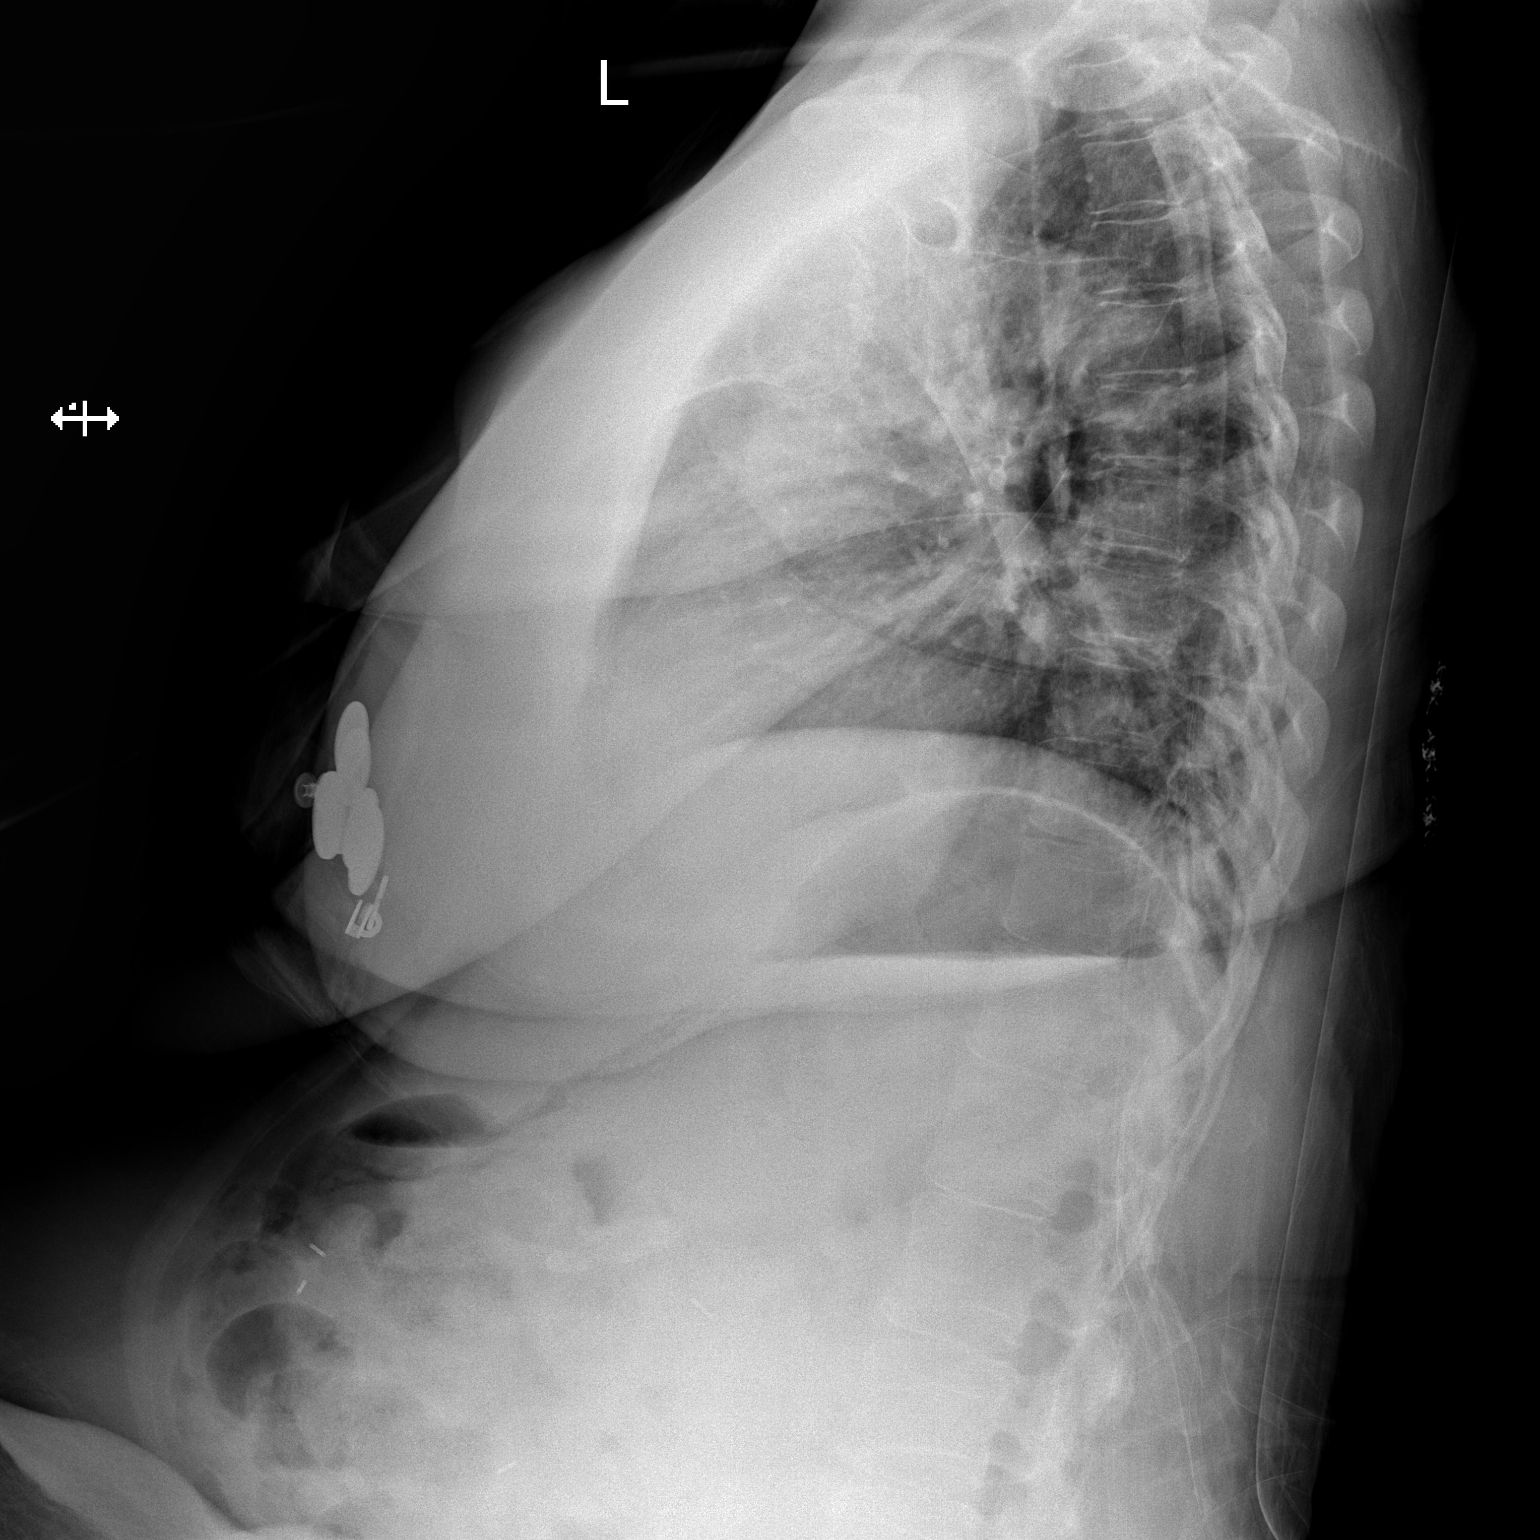

[x chest ap]
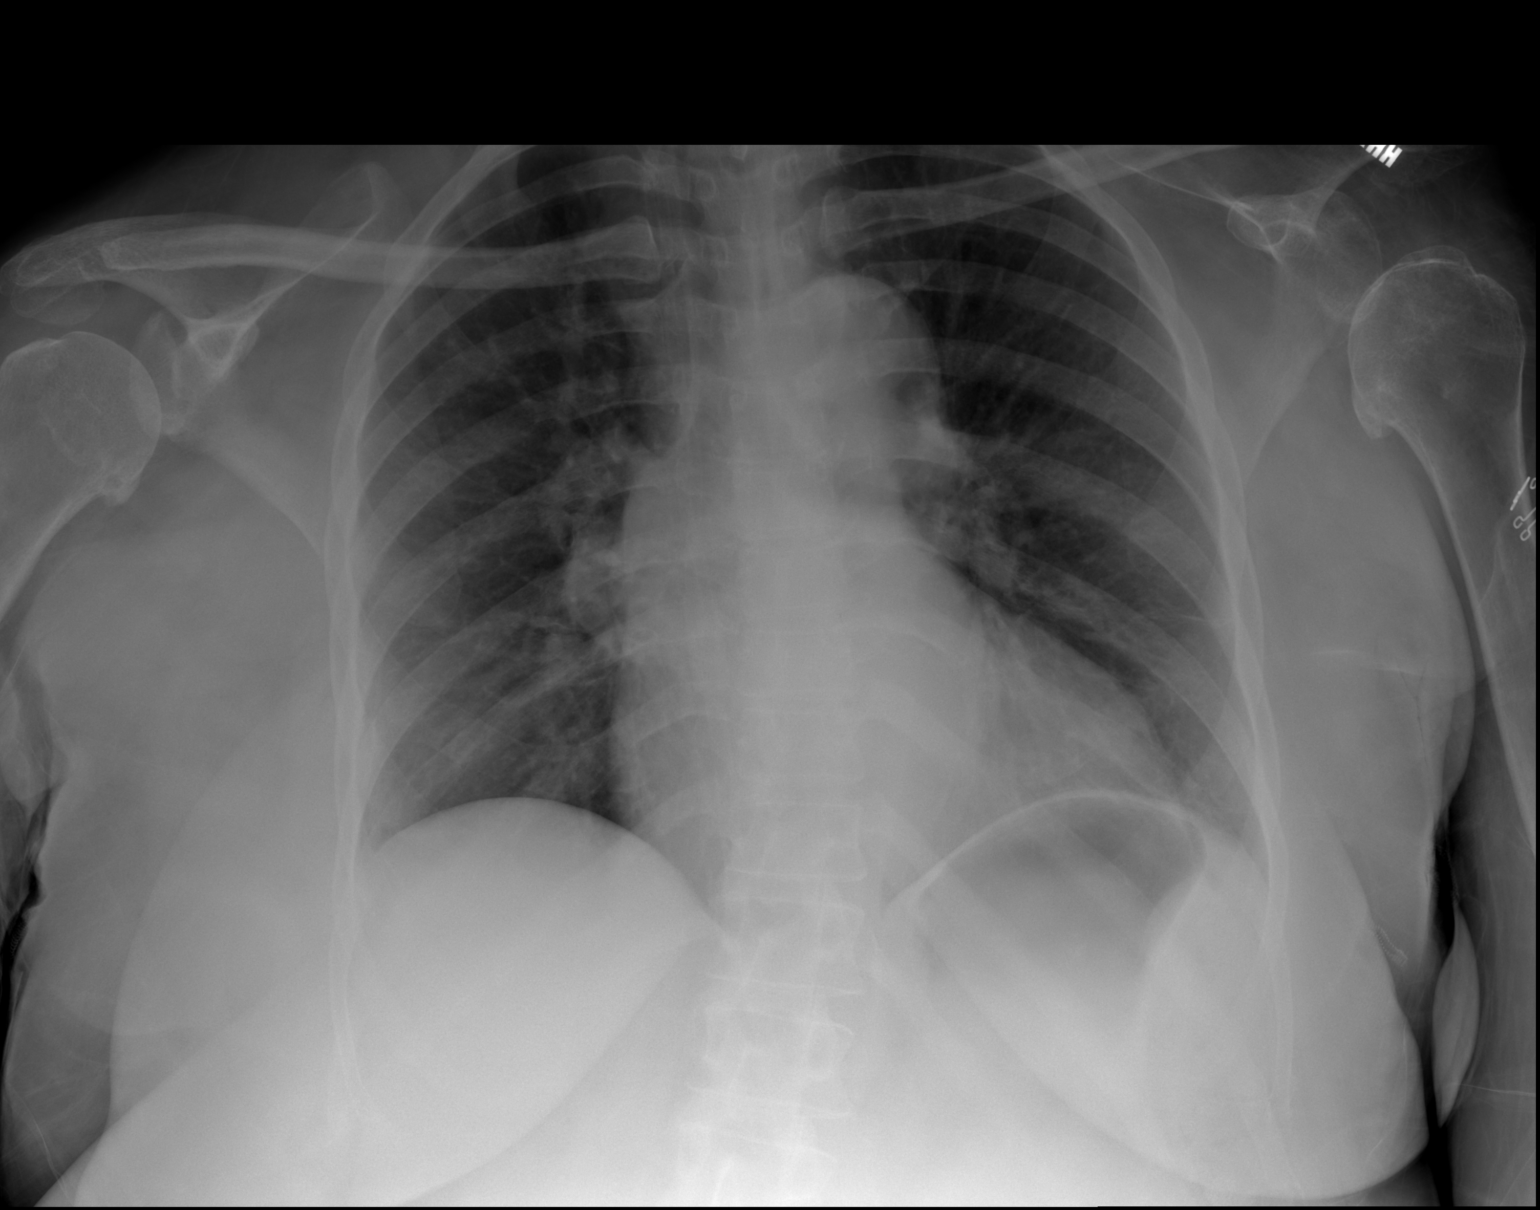

[2 of 2 positions shown; findings below may reference images not displayed]

FINDINGS: Cardiac shadow is mildly enlarged. The lungs are well aerated
bilaterally. No sizable effusion is seen. Downward displacement of
the left humeral head is noted. This is likely related to some
chronic subluxation. Clinical correlation is recommended.
IMPRESSION: No acute abnormality in the chest.

Downward displacement of the left humeral head likely related to
subluxation. Correlate with the physical exam.

## 2019-08-03 IMAGING — CT CT ABD-PELV W/O CM
2 of 4 series · 17 of 46 positions shown, 19 images · non-contrast
Comparison: None.

CLINICAL DATA: Abdominal pain

EXAM:
CT ABDOMEN AND PELVIS WITHOUT CONTRAST
TECHNIQUE: Multidetector CT imaging of the abdomen and pelvis was performed
following the standard protocol without IV contrast. The examination
was ordered as a CT angiography study, but there was infiltration
during the contrast injection.

[Series 6: axial st · axial · 0.87mm/px · z∈[+443,+843]mm · 14 of 92 slices shown, 16 images]
[im 6/92  soft-tissue]
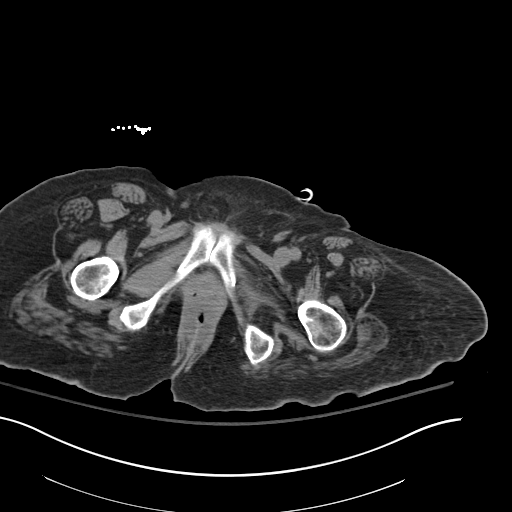
[im 6/92  bone]
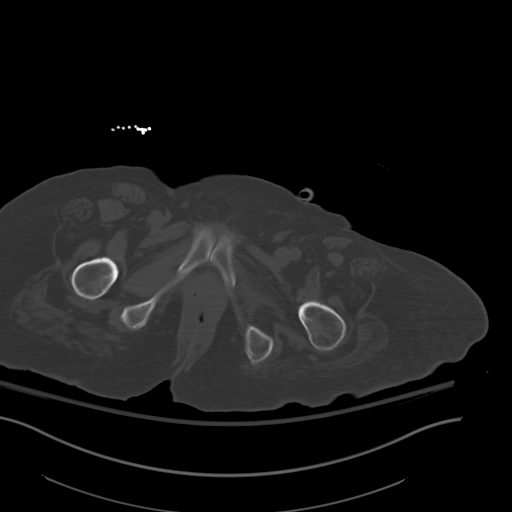
[im 11/92  soft-tissue]
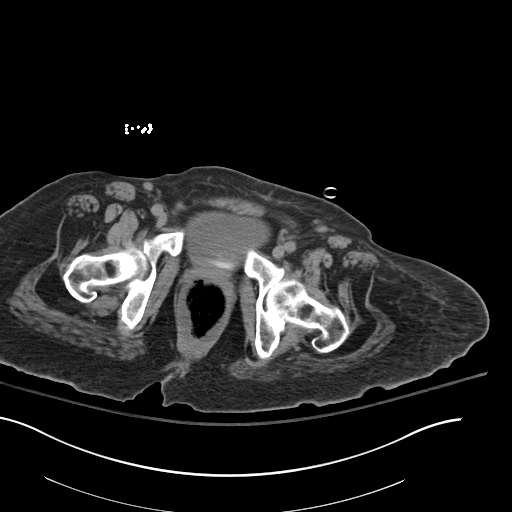
[im 17/92  soft-tissue]
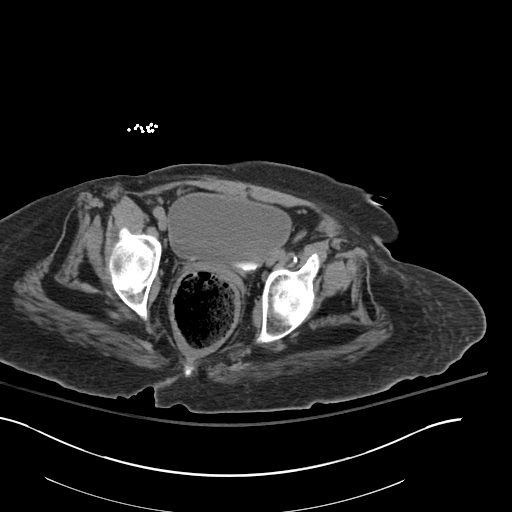
[im 27/92  soft-tissue]
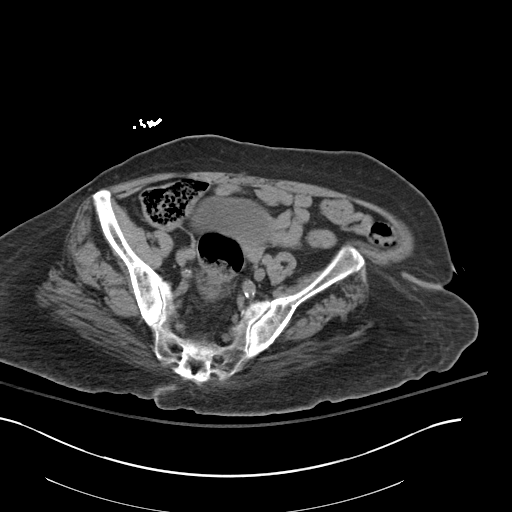
[im 33/92  soft-tissue]
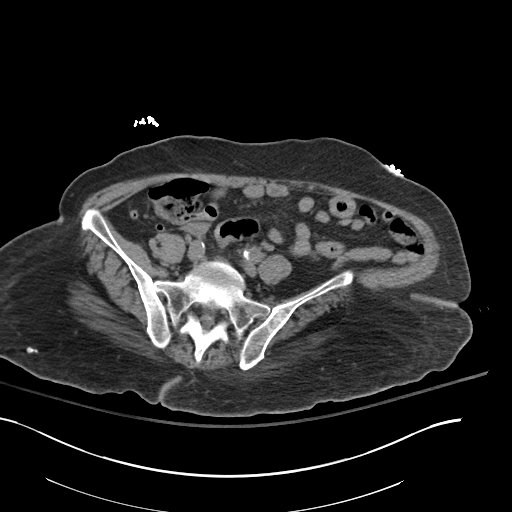
[im 38/92  soft-tissue]
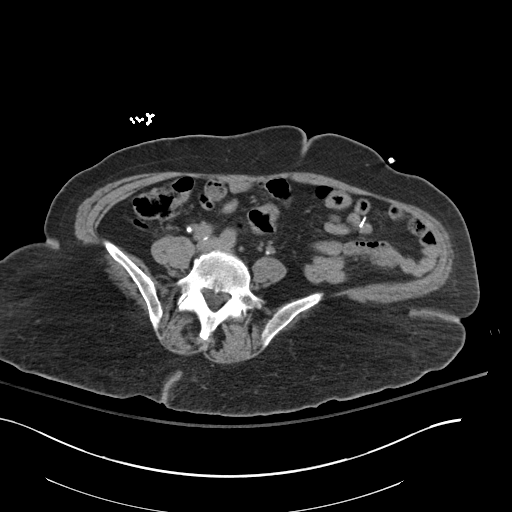
[im 43/92  soft-tissue]
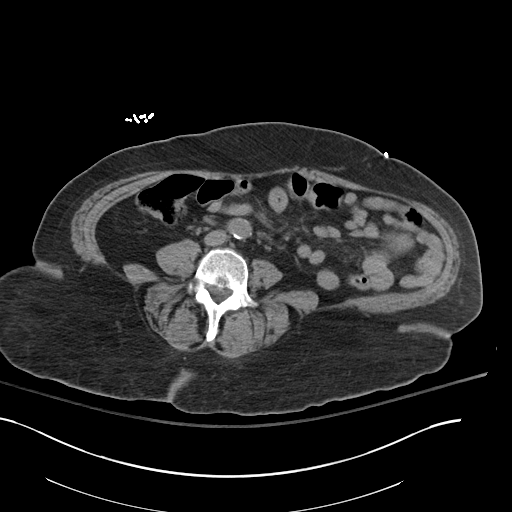
[im 49/92  soft-tissue]
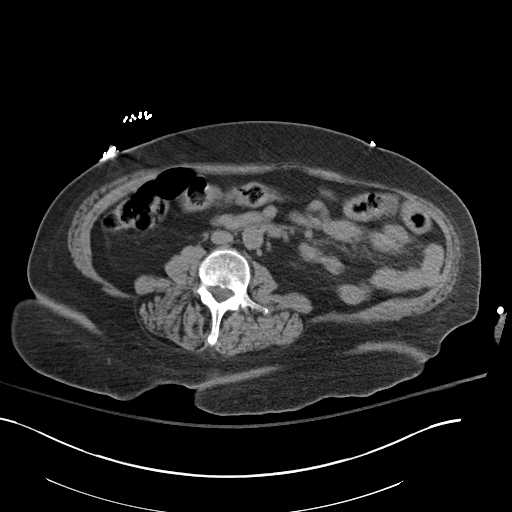
[im 54/92  soft-tissue]
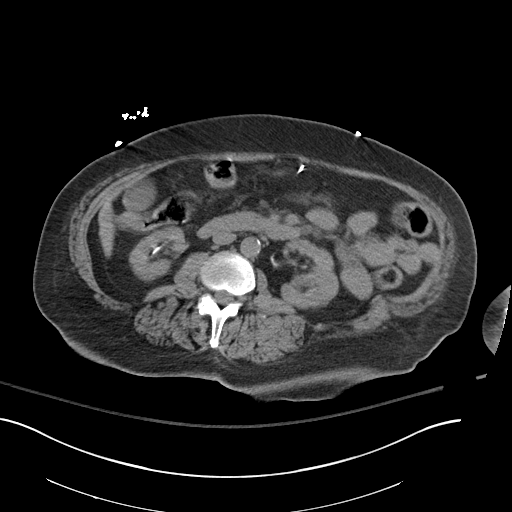
[im 54/92  bone]
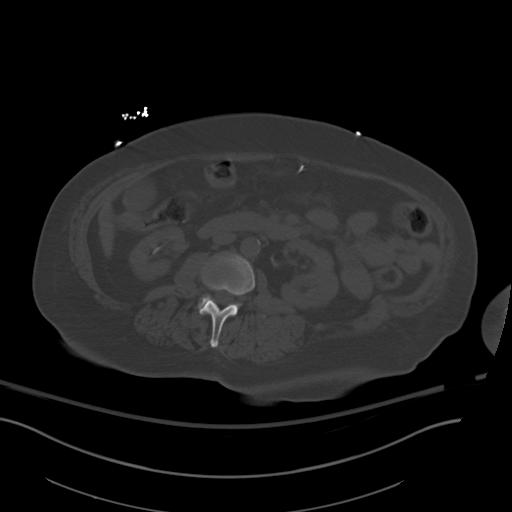
[im 59/92  soft-tissue]
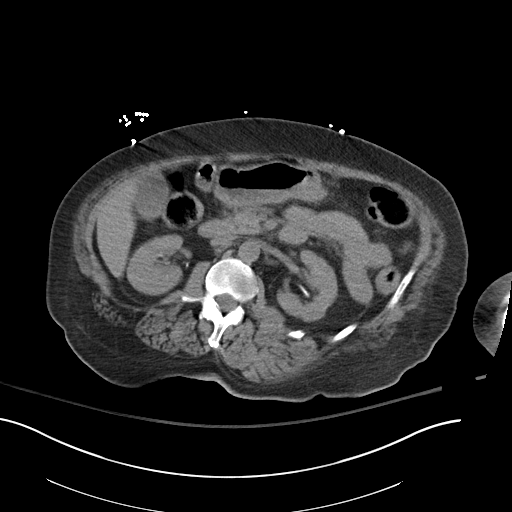
[im 70/92  soft-tissue]
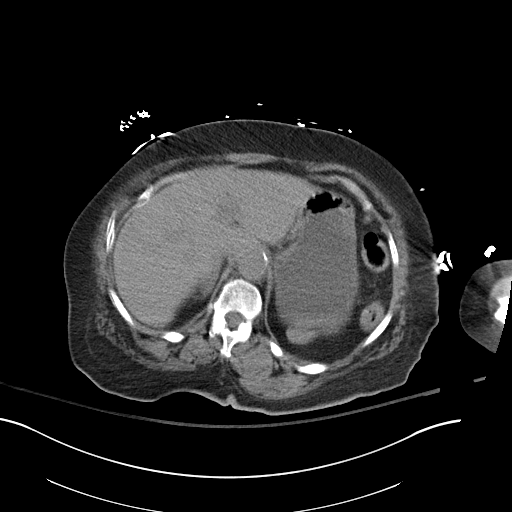
[im 75/92  soft-tissue]
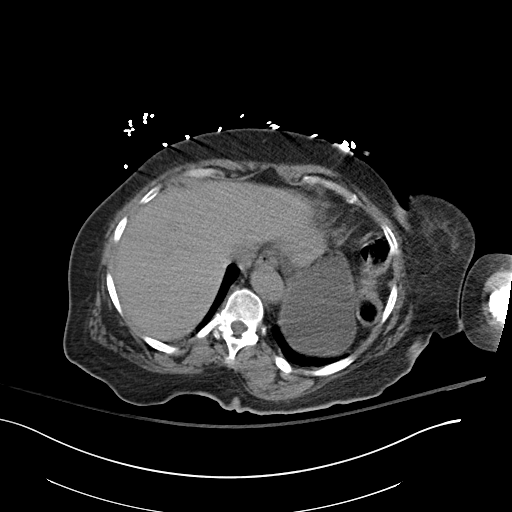
[im 81/92  soft-tissue]
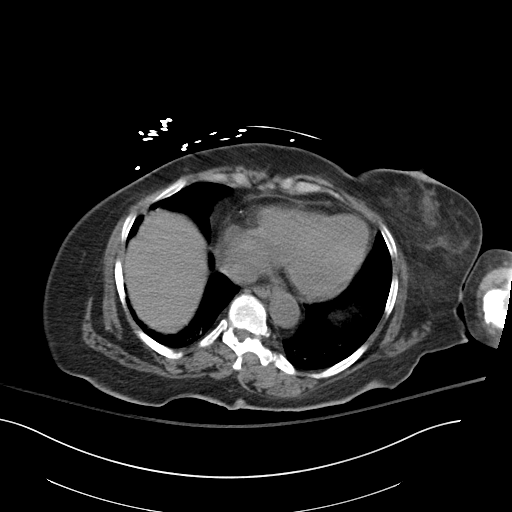
[im 86/92  soft-tissue]
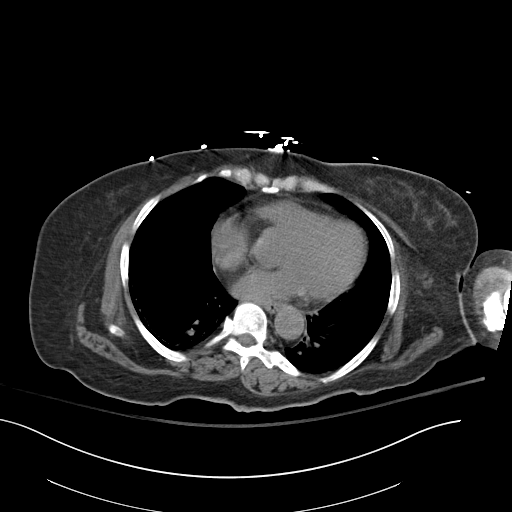

[Series 8: coronal st · coronal · 0.90mm/px · 3 of 101 slices shown]
[im 34/101  soft-tissue]
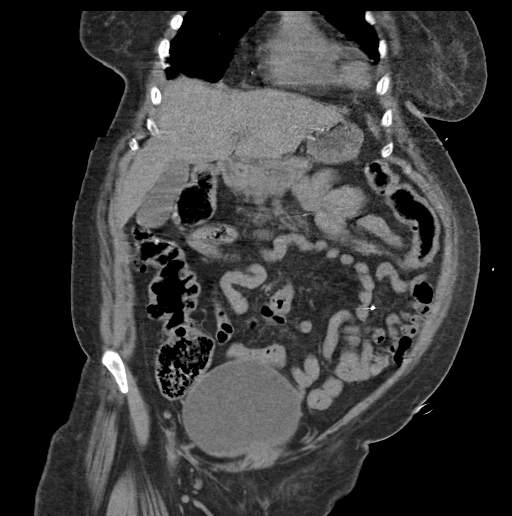
[im 45/101  soft-tissue]
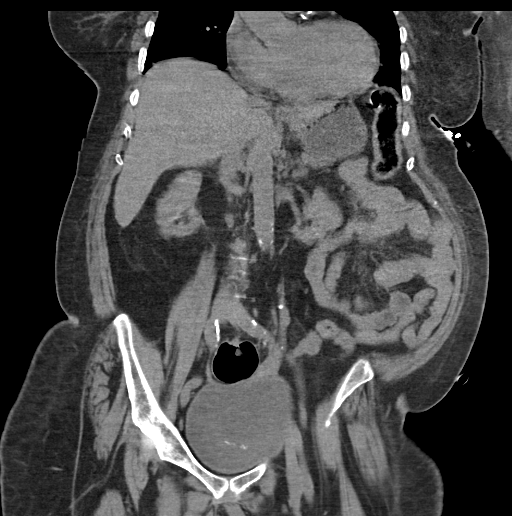
[im 56/101  soft-tissue]
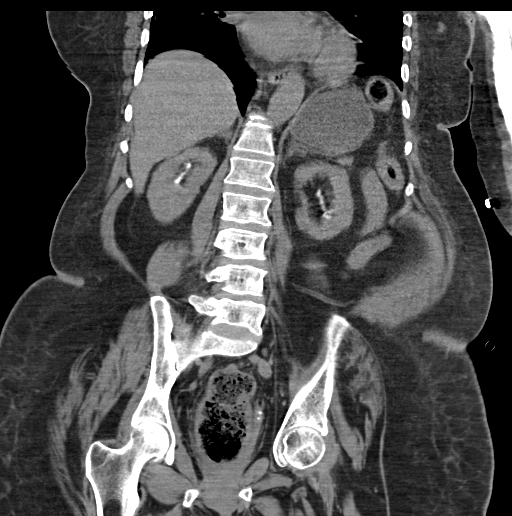

[17 of 46 positions shown; findings below may reference images not displayed]

FINDINGS: Lower chest: Coronary artery calcifications.  No pleural effusion.

Hepatobiliary: Normal hepatic contours and density. No intra- or
extrahepatic biliary dilatation. Cholelithiasis without acute
inflammation.

Pancreas: Normal parenchymal contours without ductal dilatation. No
peripancreatic fluid collection.

Spleen: Small spleen.

Adrenals/Urinary Tract:

--Adrenal glands: Normal.

--Right kidney/ureter: Suspected excretion of contrast. Otherwise
normal kidneys.

--Left kidney/ureter: Suspected excretion of contrast, otherwise
normal kidneys.

--Urinary bladder: Small amount of excreted contrast in the urinary
bladder.

Stomach/Bowel:

--Stomach/Duodenum: No hiatal hernia or other gastric abnormality.
Normal duodenal course.

--Small bowel: No dilatation or inflammation.

--Colon: No focal abnormality.

--Appendix: Normal.

Vascular/Lymphatic: Atherosclerotic calcification is present within
the non-aneurysmal abdominal aorta, without hemodynamically
significant stenosis. No abdominal or pelvic lymphadenopathy.

Reproductive: Status post hysterectomy. No adnexal mass.

Musculoskeletal. No bony spinal canal stenosis or focal osseous
abnormality.

Other: None.
IMPRESSION: 1. No acute abnormality of the abdomen or pelvis.
2. Aortic Atherosclerosis ([id number]). Coronary artery
atherosclerosis.
